# Patient Record
Sex: Male | Born: 1955 | Race: White | Hispanic: No | Marital: Married | Smoking: Current some day smoker
Health system: Southern US, Community
[De-identification: ages and names within clinical notes are randomized; demographics above are authoritative.]

## PROBLEM LIST (undated history)

## (undated) DIAGNOSIS — F1010 Alcohol abuse, uncomplicated: Secondary | ICD-10-CM

## (undated) DIAGNOSIS — F0390 Unspecified dementia without behavioral disturbance: Secondary | ICD-10-CM

## (undated) DIAGNOSIS — K7689 Other specified diseases of liver: Secondary | ICD-10-CM

## (undated) HISTORY — DX: Other specified diseases of liver: K76.89

## (undated) HISTORY — PX: APPENDECTOMY: SHX54

## (undated) HISTORY — PX: URETHRAL DILATION: SUR417

---

## 2006-04-15 ENCOUNTER — Ambulatory Visit (HOSPITAL_BASED_OUTPATIENT_CLINIC_OR_DEPARTMENT_OTHER): Admission: RE | Admit: 2006-04-15 | Discharge: 2006-04-15 | Payer: Self-pay | Admitting: Urology

## 2006-04-15 ENCOUNTER — Encounter (INDEPENDENT_AMBULATORY_CARE_PROVIDER_SITE_OTHER): Payer: Self-pay | Admitting: *Deleted

## 2006-09-28 ENCOUNTER — Ambulatory Visit: Payer: Self-pay | Admitting: Internal Medicine

## 2006-10-07 ENCOUNTER — Ambulatory Visit: Payer: Self-pay | Admitting: Internal Medicine

## 2006-10-08 ENCOUNTER — Encounter: Payer: Self-pay | Admitting: Internal Medicine

## 2010-09-25 NOTE — Op Note (Signed)
NAME:  Tanner Bond, Tanner Bond              ACCOUNT NO.:  [id number]   MEDICAL RECORD NO.:  [id number]          PATIENT TYPE:  AMB   LOCATION:  [facility]                         FACILITY:  [facility]   PHYSICIAN:  Sigmund I. Patsi Sears, M.D.DATE OF BIRTH:  1955-06-24   DATE OF PROCEDURE:  04/15/2006  DATE OF DISCHARGE:                               OPERATIVE REPORT   PREOPERATIVE DIAGNOSIS:  Left epididymal cysts, desires sterility.   POSTOPERATIVE DIAGNOSIS:  Left epididymal cysts, desires sterility.   PROCEDURE:  1. Excision of multiple left epididymal cysts.  2. Excision of left appendix testis.  3. Bilateral vasectomy.   SURGEON:  Dr. Patsi Sears   ASSISTANT:  Dr. Wilson Singer.   ANESTHESIA:  General.   SPECIMENS:  Left epididymal cyst wall, right vas, left vas.   ESTIMATED BLOOD LOSS:  Minimal.   COMPLICATIONS:  None.   DISPOSITION:  Stable to postanesthesia care unit.   INDICATIONS:  Tanner Bond is a 55 year old male who has had a  longstanding history of left-sided scrotal swelling.  He has had an  ultrasound performed which shows multiple left epididymal cysts which he  desires treatment for.  He also desires sterility.  He was counseled  regarding benefits and risks of scrotal exploration with excision of  these cysts and bilateral vasectomy.  Benefits and risks were explained,  and his consent was obtained.   DESCRIPTION OF PROCEDURE:  The patient was brought to the operating  room, properly identified.  A time-out was performed to confirm correct  patient, procedure, and side.  He was administered general anesthesia  and given preoperative antibiotics and then placed in the supine  position and prepped and draped in sterile fashion.   We made an incision along the left hemiscrotum.  Dissection was carried  down through the dartos layer to expose the cysts.  They were multiple  and appeared to be involving the epididymis level while 1 was directly  adherent to the left testicle.   The predominant cyst was dissected free  down to its stalk and then was clamped, divided, and this was ligated  with a Vicryl suture.  The remaining cyst was freed to the level of the  testicle.  This was then taken in a circumferential fashion around the  testicle.  The area of the communication was oversewn with a figure-of-  eight Vicryl suture to close the cyst opening.  We then excised the left  appendix testis.  All bleeding points were carefully coagulated.  Because we did not perform a full epididymectomy, decision was made to  perform a left vasectomy.  The vas was identified, doubly clamped,  divided.  Specimen was sent.  The vas was then ligated with a 2-0 Vicryl  suture.  The ends of the vas were coagulated.  A cord block was placed.  Because of the extensive dissection, decision was made to leave a  Penrose drain.  A separate stab incision was made through a dependent  portion of the scrotum, and a quarter-inch Penrose was placed.  The  testicle was then returned to the scrotum in its proper orientation.  The  wound was carefully and copiously irrigated.  The dartos layer was  closed in a running fashion with a 2-0 Vicryl suture.  The skin was  closed in a running fashion with a 3-0 Vicryl suture.   We then proceeded to perform right vasectomy.  The cord structures were  palpably identified, and the vas was brought to the level of the skin.  A 15 blade was used to make an incision over the skin down onto the vas.  This was sharply dissected free and clamped and then brought through the  incision.  The vas was carefully freed of all attachments.  It was then  doubly clipped, divided, and specimen was sent.  The ends were  coagulated.  The ends were also tied with Vicryl suture.  They were then  replaced into the scrotum.  Hemostasis was excellent.  The skin was then  closed in a figure-of-eight fashion with a Vicryl suture.  The patient  tolerated the procedure well, was  awakened from anesthesia, and  transported to the recovery room in stable condition.  There no  complications.  Please note that Dr. Patsi Sears was present and  participated in all aspects of this procedure.  He is Paramedic.     ______________________________  Tanner Purser, MD      Sigmund I. Patsi Sears, M.D.  Electronically Signed    JH/MEDQ  D:  04/15/2006  T:  04/15/2006  Job:  [id number]

## 2017-04-08 ENCOUNTER — Emergency Department (HOSPITAL_BASED_OUTPATIENT_CLINIC_OR_DEPARTMENT_OTHER)
Admission: EM | Admit: 2017-04-08 | Discharge: 2017-04-08 | Disposition: A | Discharge status: Home / Self Care | Payer: No Typology Code available for payment source | Attending: Emergency Medicine | Admitting: Emergency Medicine

## 2017-04-08 ENCOUNTER — Emergency Department (HOSPITAL_BASED_OUTPATIENT_CLINIC_OR_DEPARTMENT_OTHER): Payer: No Typology Code available for payment source

## 2017-04-08 ENCOUNTER — Encounter (HOSPITAL_BASED_OUTPATIENT_CLINIC_OR_DEPARTMENT_OTHER): Payer: Self-pay | Admitting: *Deleted

## 2017-04-08 ENCOUNTER — Other Ambulatory Visit: Payer: Self-pay

## 2017-04-08 DIAGNOSIS — R2243 Localized swelling, mass and lump, lower limb, bilateral: Secondary | ICD-10-CM | POA: Diagnosis present

## 2017-04-08 DIAGNOSIS — R609 Edema, unspecified: Secondary | ICD-10-CM

## 2017-04-08 DIAGNOSIS — R600 Localized edema: Secondary | ICD-10-CM | POA: Insufficient documentation

## 2017-04-08 HISTORY — DX: Alcohol abuse, uncomplicated: F10.10

## 2017-04-08 LAB — COMPREHENSIVE METABOLIC PANEL
ALBUMIN: 3.8 g/dL (ref 3.5–5.0)
ALK PHOS: 72 U/L (ref 38–126)
ALT: 17 U/L (ref 17–63)
AST: 45 U/L — AB (ref 15–41)
Anion gap: 6 (ref 5–15)
BILIRUBIN TOTAL: 2.3 mg/dL — AB (ref 0.3–1.2)
BUN: 8 mg/dL (ref 6–20)
CALCIUM: 8.7 mg/dL — AB (ref 8.9–10.3)
CO2: 26 mmol/L (ref 22–32)
Chloride: 103 mmol/L (ref 101–111)
Creatinine, Ser: 0.62 mg/dL (ref 0.61–1.24)
GFR calc Af Amer: >60 mL/min (ref >60)
GFR calc non Af Amer: >60 mL/min (ref >60)
GLUCOSE: 109 mg/dL — AB (ref 65–99)
Potassium: 3.3 mmol/L — ABNORMAL LOW (ref 3.5–5.1)
Sodium: 135 mmol/L (ref 135–145)
Total Protein: 7.3 g/dL (ref 6.5–8.1)

## 2017-04-08 LAB — CBC WITH DIFFERENTIAL/PLATELET
BASOS ABS: 0 10*3/uL (ref 0.0–0.1)
Basophils Relative: 1 %
Eosinophils Absolute: 0.3 10*3/uL (ref 0.0–0.7)
Eosinophils Relative: 4 %
HEMATOCRIT: 39.4 % (ref 39.0–52.0)
HEMOGLOBIN: 13.6 g/dL (ref 13.0–17.0)
Lymphocytes Relative: 27 %
Lymphs Abs: 1.7 10*3/uL (ref 0.7–4.0)
MCH: 35.5 pg — ABNORMAL HIGH (ref 26.0–34.0)
MCHC: 34.5 g/dL (ref 30.0–36.0)
MCV: 102.9 fL — ABNORMAL HIGH (ref 78.0–100.0)
Monocytes Absolute: 0.6 10*3/uL (ref 0.1–1.0)
Monocytes Relative: 10 %
NEUTROS ABS: 3.6 10*3/uL (ref 1.7–7.7)
NEUTROS PCT: 58 %
Platelets: 141 10*3/uL — ABNORMAL LOW (ref 150–400)
RBC: 3.83 MIL/uL — ABNORMAL LOW (ref 4.22–5.81)
RDW: 12.2 % (ref 11.5–15.5)
WBC: 6.2 10*3/uL (ref 4.0–10.5)

## 2017-04-08 NOTE — ED Provider Notes (Signed)
[facility] EMERGENCY DEPARTMENT Provider Note   CSN: [id number] Arrival date & time: 04/08/17  1425     History   Chief Complaint Chief Complaint  Patient presents with  . Leg Swelling    HPI Tanner Bond is a 61 y.o. male.  HPI   61 year old male who presents with bilateral lower extremity swelling. This has been ongoing for 2 months since he moved to [place] from the [place].  He denies associated chest pain, difficulty breathing, difficulty urinating, nausea or vomiting, diarrhea, melena or hematochezia.  No personal or family history of PE/DVT.  He does not endorse drinking alcohol daily, between 3-10 beers, and 2-3 shots of vodka daily.  Denies any other medical problems.  Has been trying to set up primary care doctor follow-up, and was able to get in to see Dr. Hyacinth Meeker today.  He was referred to the ED for ongoing testing.  Past Medical History:  Diagnosis Date  . Alcohol abuse     There are no active problems to display for this patient.   Past Surgical History:  Procedure Laterality Date  . APPENDECTOMY    . URETHRAL DILATION         Home Medications    Prior to Admission medications   Not on File    Family History No family history on file.  Social History Social History   Tobacco Use  . Smoking status: Never Smoker  . Smokeless tobacco: Never Used  Substance Use Topics  . Alcohol use: Yes  . Drug use: No     Allergies   Patient has no known allergies.   Review of Systems Review of Systems  Constitutional: Negative for fever.  Respiratory: Negative for cough and shortness of breath.   Cardiovascular: Positive for leg swelling. Negative for chest pain.  All other systems reviewed and are negative.    Physical Exam Updated Vital Signs BP 139/77 (BP Location: Right Arm)   Pulse 78   Temp 99.4 F (37.4 C) (Oral)   Resp 16   Ht 6\' 2"  (1.88 m)   Wt 75.7 kg (166 lb 14.2 oz)   SpO2 98%   BMI 21.43 kg/m    Physical Exam Physical Exam  Nursing note and vitals reviewed. Constitutional: non-toxic, and in no acute distress Head: Normocephalic and atraumatic.  Mouth/Throat: Oropharynx is clear and moist.  Neck: Normal range of motion. Neck supple.  Cardiovascular: Normal rate and regular rhythm.   Pulmonary/Chest: Effort normal and breath sounds normal.  Abdominal: Soft. There is no tenderness. There is no rebound and no guarding.  Musculoskeletal: Normal range of motion. Varicosities in bilateral lower extremities. +2 pedal edema bilaterally to the mid shin.  Neurological: Alert, no facial droop, fluent speech, moves all extremities symmetrically Skin: Skin is warm and dry.  Psychiatric: Cooperative   ED Treatments / Results  Labs (all labs ordered are listed, but only abnormal results are displayed) Labs Reviewed  CBC WITH DIFFERENTIAL/PLATELET - Abnormal; Notable for the following components:      Result Value   RBC 3.83 (*)    MCV 102.9 (*)    MCH 35.5 (*)    Platelets 141 (*)    All other components within normal limits  COMPREHENSIVE METABOLIC PANEL - Abnormal; Notable for the following components:   Potassium 3.3 (*)    Glucose, Bld 109 (*)    Calcium 8.7 (*)    AST 45 (*)    Total Bilirubin 2.3 (*)  All other components within normal limits    EKG  EKG Interpretation None       Radiology [place] Venous Img Lower Bilateral  Result Date: 04/08/2017 CLINICAL DATA:  The patient developed BILATERAL leg swelling after a long automobile trip. EXAM: BILATERAL LOWER EXTREMITY VENOUS DOPPLER ULTRASOUND TECHNIQUE: Gray-scale sonography with graded compression, as well as color Doppler and duplex ultrasound were performed to evaluate the lower extremity deep venous systems from the level of the common femoral vein and including the common femoral, femoral, profunda femoral, popliteal and calf veins including the posterior tibial, peroneal and gastrocnemius veins when visible. The  superficial great saphenous vein was also interrogated. Spectral Doppler was utilized to evaluate flow at rest and with distal augmentation maneuvers in the common femoral, femoral and popliteal veins. COMPARISON:  None. FINDINGS: RIGHT LOWER EXTREMITY Common Femoral Vein: No evidence of thrombus. Normal compressibility, respiratory phasicity and response to augmentation. Saphenofemoral Junction: No evidence of thrombus. Normal compressibility and flow on color Doppler imaging. Profunda Femoral Vein: No evidence of thrombus. Normal compressibility and flow on color Doppler imaging. Femoral Vein: No evidence of thrombus. Normal compressibility, respiratory phasicity and response to augmentation. Popliteal Vein: No evidence of thrombus. Normal compressibility, respiratory phasicity and response to augmentation. Calf Veins: Poor visualization, but no visible thrombus. Superficial Great Saphenous Vein: No evidence of thrombus. Normal compressibility. Venous Reflux:  None. Other Findings:  Calf edema. LEFT LOWER EXTREMITY Common Femoral Vein: No evidence of thrombus. Normal compressibility, respiratory phasicity and response to augmentation. Saphenofemoral Junction: No evidence of thrombus. Normal compressibility and flow on color Doppler imaging. Profunda Femoral Vein: No evidence of thrombus. Normal compressibility and flow on color Doppler imaging. Femoral Vein: No evidence of thrombus. Normal compressibility, respiratory phasicity and response to augmentation. Popliteal Vein: No evidence of thrombus. Normal compressibility, respiratory phasicity and response to augmentation. Calf Veins: No evidence of thrombus. Normal compressibility and flow on color Doppler imaging. Superficial Great Saphenous Vein: No evidence of thrombus. Normal compressibility. Venous Reflux:  None. Other Findings:  Calf edema. IMPRESSION: No evidence of deep venous thrombosis. BILATERAL RIGHT greater than LEFT calf edema. Electronically Signed    By: Elsie StainJohn T Curnes M.D.   On: 04/08/2017 19:28    Procedures Procedures (including critical care time)  Medications Ordered in ED Medications - No data to display   Initial Impression / Assessment and Plan / ED Course  I have reviewed the triage vital signs and the nursing notes.  Pertinent labs & imaging results that were available during my care of the patient were reviewed by me and considered in my medical decision making (see chart for details).     Presenting with bilateral lower extremity edema, right greater than left.  He is nontoxic in no acute distress and with normal vital signs.  No symptoms or concerns for heart failure, PE.  Lower extremity venous ultrasound does not show evidence of DVT.  He does show varicosities and some signs of venous insufficiency.  Discussed supportive care management including leg elevation, compression stockings.  Blood work does show liver dysfunction, related to likely his alcohol dependence history. May be role in peripheral edema. Normal renal function. Patient to be referred to PCP first. Counseled on decreasing alcohol usage and eventually quitting. Strict return and follow-up instructions reviewed. He expressed understanding of all discharge instructions and felt comfortable with the plan of care.   Final Clinical Impressions(s) / ED Diagnoses   Final diagnoses:  Peripheral edema    ED Discharge Orders  None       Lavera GuiseLiu, Ireanna Finlayson Duo, MD 04/08/17 [phone]

## 2017-04-08 NOTE — Discharge Instructions (Signed)
Please keep legs elevated on 2-3 pillows at rest. Wear compression stockings during the day. YOu may need to follow-up with vascular specialist for ongoing management if symptoms bothersome to you.  Please also arrange follow-up with primary care doctor. You do show signs of liver dysfunction, likely related to your alcohol use. You may also need to follow-up with Gi doctor. In the interim, please consider decreasing your alcohol use significantly.   Return for worsening symptoms, including fever, confusion, difficulty breathing, or any other symptoms concerning ot you.

## 2017-04-08 NOTE — ED Triage Notes (Signed)
Both legs are swelling x 2 months after driving [place]. Denies SOB. He was seen by his MD today and further evaluation for alcohol abuse being the cause vs DVT.

## 2017-04-12 ENCOUNTER — Encounter: Payer: Self-pay | Admitting: Family Medicine

## 2017-04-12 ENCOUNTER — Ambulatory Visit (INDEPENDENT_AMBULATORY_CARE_PROVIDER_SITE_OTHER): Payer: No Typology Code available for payment source | Admitting: Family Medicine

## 2017-04-12 VITALS — BP 132/82 | HR 86 | Temp 98.4°F | Ht 72.0 in | Wt 169.8 lb

## 2017-04-12 DIAGNOSIS — Z23 Encounter for immunization: Secondary | ICD-10-CM | POA: Diagnosis not present

## 2017-04-12 DIAGNOSIS — Z86010 Personal history of colonic polyps: Secondary | ICD-10-CM | POA: Insufficient documentation

## 2017-04-12 DIAGNOSIS — I10 Essential (primary) hypertension: Secondary | ICD-10-CM | POA: Diagnosis not present

## 2017-04-12 DIAGNOSIS — Z860101 Personal history of adenomatous and serrated colon polyps: Secondary | ICD-10-CM | POA: Insufficient documentation

## 2017-04-12 DIAGNOSIS — F1020 Alcohol dependence, uncomplicated: Secondary | ICD-10-CM | POA: Diagnosis not present

## 2017-04-12 DIAGNOSIS — F1021 Alcohol dependence, in remission: Secondary | ICD-10-CM | POA: Insufficient documentation

## 2017-04-12 DIAGNOSIS — R160 Hepatomegaly, not elsewhere classified: Secondary | ICD-10-CM

## 2017-04-12 MED ORDER — TRIAMCINOLONE ACETONIDE 0.1 % EX CREA: 1.0000 "application " | TOPICAL_CREAM | Freq: Two times a day (BID) | CUTANEOUS | 1 refills | Status: DC

## 2017-04-12 NOTE — Progress Notes (Signed)
Phone: [phone]  Subjective:  Patient presents today to establish care.  Prior patient in [place] but over 10 years ago- has been living in [place]. Chief complaint-noted.   See problem oriented charting  The following were reviewed and entered/updated in epic: Past Medical History:  Diagnosis Date  . Alcohol abuse   . Liver dysfunction    Patient Active Problem List   Diagnosis Date Noted  . Hypertension 04/13/2017  . Alcoholism (HCC) 04/12/2017  . History of adenomatous polyp of colon 04/12/2017   Past Surgical History:  Procedure Laterality Date  . APPENDECTOMY     as child  . URETHRAL DILATION     as child    Family History  Problem Relation Age of Onset  . Dementia Mother   . Prostate cancer Father     Medications- reviewed and updated Current Outpatient Medications  Medication Sig Dispense Refill  . triamcinolone cream (KENALOG) 0.1 % Apply 1 application topically 2 (two) times daily. Take 1 week break after 1 week of use 80 g 1   No current facility-administered medications for this visit.     Allergies-reviewed and updated No Known Allergies  Social History   Socioeconomic History  . Marital status: Married    Spouse name: None  . Number of children: None  . Years of education: None  . Highest education level: None  Social Needs  . Financial resource strain: None  . Food insecurity - worry: None  . Food insecurity - inability: None  . Transportation needs - medical: None  . Transportation needs - non-medical: None  Occupational History  . None  Tobacco Use  . Smoking status: Never Smoker  . Smokeless tobacco: Never Used  Substance and Sexual Activity  . Alcohol use: Yes    Comment: at least 3-4 a day up to 10  . Drug use: No  . Sexual activity: Yes  Other Topics Concern  . None  Social History Narrative   Married- lives with wife.       Lived from around 2009 to 2018 in [place]. Want to be back for more family  time and see some snow   4 children (3 daughters, 1 son all grown). 7 grandkids. 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  HENT: Negative for ear discharge and ear pain.   Eyes: Negative for blurred vision and double vision.  Respiratory: Negative for cough and shortness of breath.   Cardiovascular: Negative for chest pain and palpitations.  Gastrointestinal: Negative for abdominal pain and vomiting.  Genitourinary: Negative for dysuria and urgency.  Musculoskeletal: Negative for falls and neck pain.  Skin: Positive for rash (spider veins on abdomen). Negative for itching.  Neurological: Negative for dizziness and headaches.  Endo/Heme/Allergies: Negative for polydipsia. Does not bruise/bleed easily.  Psychiatric/Behavioral: Positive for substance abuse (alcohol abuse). Negative for suicidal ideas.   Objective: BP 132/82 (BP Location: Left Arm, Patient Position: Sitting, Cuff Size: Large)   Pulse 86   Temp 98.4 F (36.9 C) (Oral)   Ht 6' (1.829 m)   Wt 169 lb 12.8 oz (77 kg)   SpO2 96%   BMI 23.03 kg/m  Gen: NAD, resting comfortably HEENT: Mucous membranes are moist. Oropharynx normal. TM normal. Eyes: sclera and lids normal, PERRLA Neck:  no thyromegaly, no cervical lymphadenopathy CV: RRR no murmurs rubs or gallops Lungs: CTAB no crackles, wheeze, rhonchi Abdomen: soft/nontender/nondistended/normal bowel sounds. No rebound or guarding.  Hepatomegaly noted Ext: 1+ edema under compression stockings Skin: warm, dry, spider angiomas noted on chest Neuro: 5/5 strength in upper and lower extremities, normal gait, normal reflexes, appears to be mildly slightly shaky  Assessment/plan  Rash S: has noted papules on his face since leaving [place]- not particularly itchy. Also pruritic  rash on legs- told eczema in past - hydrocortisone 2.5% he could get from the [place] helped ROS-not ill appearing, no fever/chills. No new medications. Not immunocompromised. No mucus membrane involvement.  A/P: rash on legs could be related to pruritis if cirrhoitc- will trial triamcinolone with follow up after ultrasound.   Facial rash could be rosacea- consider treatment at follow up  Alcoholism [facility](HCC) S: Patient at least for last 10 years has been drinking excess alcohol. Use to use liquor now feels he is much improved as only drinking alcohol 3-10 x a day and often on upper end. Last 2 years have been particularly bad. He has been told by a family friend that he needs to go to AA  We reviewed fact he is already having some changes on LFTs as noted during hospitalization. Edema strong potential to be related to cirrhosis.  Lab Results  Component Value Date   ALT 17 04/08/2017   AST 45 (H) 04/08/2017   ALKPHOS 72 04/08/2017   BILITOT 2.3 (H) 04/08/2017    A/P: Extended counseling on risks of alcohol, potential for cirrhosis given physical exam findings. Will get ultrasound of abdomen. After that will have patient back-   We discussed using something like a librium taper but he is not ready to completely quit- instead wants to work on slowly cutting down but informed him right solution for him is to quit.   Consider GGT, hepatitis panel, repeat LFTs at follow up  Hypertension S: controlled today on no medication- has been on medicine in the past.  BP Readings from Last 3 Encounters:  04/12/17 132/82  04/08/17 (!) 146/82  A/P: We discussed blood pressure goal of <140/90. Continue without meds- will trend.    History of adenomatous polyp of colon History of adenomatous polyp- past due for colonoscopy- will refer back  Patient Instructions  Tdap and flu today  Likely shingrix will be hard to find- hopefully we can get this next year  We will call you within a week or two about  your referral for ultrasound liver. If you do not hear within 3 weeks, give us a call.  I want to see you back a week or two after ultrasound- will likely need to get more extensive bloodwork at that time  Please strongly consider AA- I am worried we are dealing with cirrhosis already which can lead to early death  We will call you within a week or two about your referral for colonoscopy. If you do not hear within 3 weeks, give us a call.    Sent cream for legs  1 week follow up after ultrasound  Orders Placed This Encounter  Procedures  . US Abdomen Limited RUQ    Standing Status:   Future    Standing Expiration Date:   06/13/2018    Order Specific Question:   Reason for Exam (SYMPTOM  OR DIAGNOSIS REQUIRED)    Answer:   alcoholic, hepatomegaly, spider angiomas- suspect cirrhosis    Order Specific Question:  Preferred imaging location?    Answer:   [facility]   . Tdap vaccine greater than or equal to 7yo IM  . Flu Vaccine QUAD 36+ mos IM  . Ambulatory referral to Gastroenterology    Referral Priority:   Routine    Referral Type:   Consultation    Referral Reason:   Specialty Services Required    Number of Visits Requested:   1    Meds ordered this encounter  Medications  . triamcinolone cream (KENALOG) 0.1 %    Sig: Apply 1 application topically 2 (two) times daily. Take 1 week break after 1 week of use    Dispense:  80 g    Refill:  1   The duration of face-to-face time during this visit was greater than 30 minutes. Greater than 50% of this time was spent in counseling, explanation of diagnosis, planning of further management, and/or coordination of care including potential for cirrhosis, risks of alcohol use, options to help with sobriety.   Return precautions advised.  Tana ConchStephen Daziya Redmond, MD

## 2017-04-12 NOTE — Patient Instructions (Addendum)
Tdap and flu today  Likely shingrix will be hard to find- hopefully we can get this next year  We will call you within a week or two about your referral for ultrasound liver. If you do not hear within 3 weeks, give us a call.  I want to see you back a week or two after ultrasound- will likely need to get more extensive bloodwork at that time  Please strongly consider AA- I am worried we are dealing with cirrhosis already which can lead to early death  We will call you within a week or two about your referral for colonoscopy. If you do not hear within 3 weeks, give us a call.    Sent cream for legs

## 2017-04-13 DIAGNOSIS — I10 Essential (primary) hypertension: Secondary | ICD-10-CM | POA: Insufficient documentation

## 2017-04-13 NOTE — Assessment & Plan Note (Signed)
S: controlled today on no medication- has been on medicine in the past.  BP Readings from Last 3 Encounters:  04/12/17 132/82  04/08/17 (!) 146/82  A/P: We discussed blood pressure goal of <140/90. Continue without meds- will trend.

## 2017-04-13 NOTE — Assessment & Plan Note (Addendum)
S: Patient at least for last 10 years has been drinking excess alcohol. Use to use liquor now feels he is much improved as only drinking alcohol 3-10 x a day and often on upper end. Last 2 years have been particularly bad. He has been told by a family friend that he needs to go to AA  We reviewed fact he is already having some changes on LFTs as noted during hospitalization. Edema strong potential to be related to cirrhosis.  Lab Results  Component Value Date   ALT 17 04/08/2017   AST 45 (H) 04/08/2017   ALKPHOS 72 04/08/2017   BILITOT 2.3 (H) 04/08/2017    A/P: Extended counseling on risks of alcohol, potential for cirrhosis given physical exam findings. Will get ultrasound of abdomen. After that will have patient back-   We discussed using something like a librium taper but he is not ready to completely quit- instead wants to work on slowly cutting down but informed him right solution for him is to quit.   Consider GGT, hepatitis panel, repeat LFTs at follow up

## 2017-04-13 NOTE — Assessment & Plan Note (Signed)
History of adenomatous polyp- past due for colonoscopy- will refer back

## 2017-04-14 ENCOUNTER — Encounter: Payer: Self-pay | Admitting: Family Medicine

## 2017-05-05 ENCOUNTER — Encounter: Payer: Self-pay | Admitting: Family Medicine

## 2017-05-09 ENCOUNTER — Telehealth: Payer: Self-pay

## 2017-05-09 NOTE — Telephone Encounter (Signed)
Called patient and left a voicemail message asking him to return my call or call   [facility] [address] [place], [place]  [id number] Phone:  [phone]   Fax:  [phone]  To schedule his appointment

## 2017-06-24 ENCOUNTER — Encounter: Payer: Self-pay | Admitting: Family Medicine

## 2017-06-27 ENCOUNTER — Encounter: Payer: Self-pay | Admitting: Family Medicine

## 2017-06-28 MED ORDER — TRIAMCINOLONE ACETONIDE 0.1 % EX CREA: 1.0000 "application " | TOPICAL_CREAM | Freq: Two times a day (BID) | CUTANEOUS | 2 refills | Status: DC

## 2017-06-28 NOTE — Telephone Encounter (Signed)
Called pt and asked him what I can help him with? Pt said he was calling to discuss his bill from the ED and that he is wanting hold off on any other testing until his bill is straightened out, he has contacted Billing. Asked pt if he read message from 2/15? Pt said yes. Told him at physical prostate will be checked then. Pt verbalized understanding and said it shows he needs Hep C test. Told pt when you have your physical we can check it then with your labs. Pt verbalized understanding. Pt said he is schedule for U/S tomorrow wants to know if it will check his liver? Told pt yes it will look at your liver. Gave pt instructions for U/S and where to go tomorrow. Pt verbalized understanding. Also gave pt number for [facility] and told him to call and schedule at his convince. Pt verbalized understanding.

## 2017-06-28 NOTE — Telephone Encounter (Signed)
Copied from CRM [id number]. Topic: Quick Communication - Office Called Patient >> Jun 24, 2017  3:16 PM Dierdre Searlesoleman, Brandy S, [place] wrote:   Reason for CRM: Patient scheduled for 06/29/17 at 11:30 to [facility], [facility], radiology dept. NPO after midnight. He can call [phone] to r/s if needed. OK to advise pt.  >> Jun 28, 2017  9:55 AM Eston Mouldavis, Cheri B wrote: PT is asking for a call back from the office.  He states he can not afford to [facility] due to recent moving expenses.

## 2017-06-29 ENCOUNTER — Ambulatory Visit (HOSPITAL_COMMUNITY)
Admission: RE | Admit: 2017-06-29 | Discharge: 2017-06-29 | Disposition: A | Discharge status: Home / Self Care | Payer: No Typology Code available for payment source | Source: Ambulatory Visit | Attending: Family Medicine | Admitting: Family Medicine

## 2017-06-29 DIAGNOSIS — K824 Cholesterolosis of gallbladder: Secondary | ICD-10-CM | POA: Insufficient documentation

## 2017-06-29 DIAGNOSIS — R932 Abnormal findings on diagnostic imaging of liver and biliary tract: Secondary | ICD-10-CM | POA: Insufficient documentation

## 2017-06-29 DIAGNOSIS — R160 Hepatomegaly, not elsewhere classified: Secondary | ICD-10-CM | POA: Diagnosis not present

## 2017-06-29 DIAGNOSIS — F1020 Alcohol dependence, uncomplicated: Secondary | ICD-10-CM

## 2017-06-29 NOTE — Telephone Encounter (Signed)
Dr. Durene CalHunter, I spoke to pt yesterday see message. Pt had U/S done today report is in chart.

## 2017-07-05 ENCOUNTER — Encounter: Payer: Self-pay | Admitting: Internal Medicine

## 2017-07-05 ENCOUNTER — Ambulatory Visit (INDEPENDENT_AMBULATORY_CARE_PROVIDER_SITE_OTHER): Payer: No Typology Code available for payment source | Admitting: Family Medicine

## 2017-07-05 ENCOUNTER — Encounter: Payer: Self-pay | Admitting: Family Medicine

## 2017-07-05 VITALS — BP 124/78 | HR 73 | Temp 98.3°F | Ht 72.0 in | Wt 170.8 lb

## 2017-07-05 DIAGNOSIS — K709 Alcoholic liver disease, unspecified: Secondary | ICD-10-CM

## 2017-07-05 DIAGNOSIS — R358 Other polyuria: Secondary | ICD-10-CM

## 2017-07-05 DIAGNOSIS — N401 Enlarged prostate with lower urinary tract symptoms: Secondary | ICD-10-CM | POA: Diagnosis not present

## 2017-07-05 DIAGNOSIS — G2581 Restless legs syndrome: Secondary | ICD-10-CM

## 2017-07-05 DIAGNOSIS — I10 Essential (primary) hypertension: Secondary | ICD-10-CM | POA: Diagnosis not present

## 2017-07-05 DIAGNOSIS — G4700 Insomnia, unspecified: Secondary | ICD-10-CM | POA: Diagnosis not present

## 2017-07-05 DIAGNOSIS — R3589 Other polyuria: Secondary | ICD-10-CM

## 2017-07-05 DIAGNOSIS — R351 Nocturia: Secondary | ICD-10-CM

## 2017-07-05 DIAGNOSIS — Z125 Encounter for screening for malignant neoplasm of prostate: Secondary | ICD-10-CM | POA: Diagnosis not present

## 2017-07-05 DIAGNOSIS — Z1211 Encounter for screening for malignant neoplasm of colon: Secondary | ICD-10-CM

## 2017-07-05 LAB — POC URINALSYSI DIPSTICK (AUTOMATED)
Bilirubin, UA: NEGATIVE
Blood, UA: NEGATIVE
Glucose, UA: NEGATIVE
KETONES UA: NEGATIVE
LEUKOCYTES UA: NEGATIVE
Nitrite, UA: NEGATIVE
PH UA: 6 (ref 5.0–8.0)
PROTEIN UA: NEGATIVE
SPEC GRAV UA: 1.015 (ref 1.010–1.025)
Urobilinogen, UA: 0.2 E.U./dL

## 2017-07-05 LAB — PSA: PSA: 0.39 ng/mL (ref 0.10–4.00)

## 2017-07-05 LAB — COMPREHENSIVE METABOLIC PANEL
ALT: 14 U/L (ref 0–53)
AST: 24 U/L (ref 0–37)
Albumin: 4 g/dL (ref 3.5–5.2)
Alkaline Phosphatase: 73 U/L (ref 39–117)
BUN: 11 mg/dL (ref 6–23)
CALCIUM: 9.6 mg/dL (ref 8.4–10.5)
CHLORIDE: 101 meq/L (ref 96–112)
CO2: 28 meq/L (ref 19–32)
CREATININE: 0.68 mg/dL (ref 0.40–1.50)
GFR: 125.05 mL/min (ref >60.00)
GLUCOSE: 95 mg/dL (ref 70–99)
Potassium: 3.9 mEq/L (ref 3.5–5.1)
Sodium: 137 mEq/L (ref 135–145)
Total Bilirubin: 2.2 mg/dL — ABNORMAL HIGH (ref 0.2–1.2)
Total Protein: 6.9 g/dL (ref 6.0–8.3)

## 2017-07-05 LAB — GAMMA GT: GGT: 41 U/L (ref 7–51)

## 2017-07-05 MED ORDER — TRAZODONE HCL 50 MG PO TABS: 25.0000 mg | ORAL_TABLET | Freq: Every evening | ORAL | 3 refills | Status: DC | PRN

## 2017-07-05 NOTE — Progress Notes (Signed)
Subjective:  Tanner Bond is a 62 y.o. year old very pleasant male patient who presents for/with See problem oriented charting ROS- no fever or chills.  No ruq pain. No obvious withdrawal symptoms. No chest pain or shortness of breath.   Past Medical History-  Patient Active Problem List   Diagnosis Date Noted  . Hypertension 04/13/2017    Priority: Medium  . BPH associated with nocturia 07/06/2017  . Insomnia 07/06/2017  . Alcoholic liver disease (HCC) 07/05/2017  . Alcoholism (HCC) 04/12/2017  . History of adenomatous polyp of colon 04/12/2017    Medications- reviewed and updated Current Outpatient Medications  Medication Sig Dispense Refill  . triamcinolone cream (KENALOG) 0.1 % Apply 1 application topically 2 (two) times daily. Take 1 week break after 1 week of use 80 g 2   No current facility-administered medications for this visit.     Objective: BP 124/78 (BP Location: Left Arm, Patient Position: Sitting, Cuff Size: Large)   Pulse 73   Temp 98.3 F (36.8 C) (Oral)   Ht 6' (1.829 m)   Wt 170 lb 12.8 oz (77.5 kg)   SpO2 97%   BMI 23.16 kg/m  Gen: NAD, resting comfortably CV: RRR no murmurs rubs or gallops Lungs: CTAB no crackles, wheeze, rhonchi Abdomen: soft/nontender/nondistended/normal bowel sounds. Slight hepatomegaly Ext: no edema Skin: warm, dry Rectal: normal tone, diffusely enlarged prostate, no masses or tenderness   Assessment/Plan:   Alcoholic liver disease (HCC) S: luckily no obvious cirrhosis on ultrasound. Does have some fatty changes likely associated with alcoholic liver disease A/P: Biggest thing is to abstain from alcohol- he declines AA again. He has cut down from 3-10 alcoholic beverages a day to 2 glasses of wine every other day but had odouls nonalcoholic 5-10 a day. Adivsed water (will consider bottled but doesn't like his well water), he doesn't like sodas- he essentially makes excuses and is not ready to fully quit.   we will evaluate  for hepatitis and immunize as appropriate. GGT not elevated today Lab Results  Component Value Date   ALT 14 07/05/2017   AST 24 07/05/2017   ALKPHOS 73 07/05/2017   BILITOT 2.2 (H) 07/05/2017  LFTs improved- still slightly high bilirubin.   BPH associated with nocturia Polyuria/nocturia S: frequent urination in the day. Worse at night- up to 5x. No recent pSA Lab Results  Component Value Date   PSA 0.39 07/05/2017  A/P: some BPH on exam- likely cause. PSA not particularly high- doubt prostate cancer but will trend yearly. Could consider flomax in the future. UA reassuring- doubt UTI   Hypertension S: controlled on no rx BP Readings from Last 3 Encounters:  07/05/17 124/78  04/12/17 132/82  04/08/17 (!) 146/82  A/P: blood pressure goal of <140/90. Seems cutting down on alcohol intake is helping   Insomnia S: Patient states melatonin did not help. He has no issues with prolonged erections. Legs twitch at bedtime but not primary thing that keeps him up- wife notes it even if hes asleep A/P: we discussed trial of trazodone (warned of potential prolonged erections). Could consider restless legs treatment if trazodone not helpful. Would avoid benzos with his alchol history    Future Appointments  Date Time Provider Department Center  09/13/2017 10:00 AM [facility]  09/27/2017 10:30 AM Iva BoopGessner, Carl E, MD [facility]   Lab/Order associations: Alcoholic liver disease (HCC) - Plan: Gamma GT, Comprehensive metabolic panel, Hepatitis C antibody, Hepatitis B surface antigen, Hepatitis B  surface antibody, Hepatitis B core antibody, total, Hepatitis A antibody, total, Hepatitis A antibody, IgM  Polyuria - Plan: POCT Urinalysis Dipstick (Automated), POCT Urinalysis Dipstick (Automated)  Screening PSA (prostate specific antigen) - Plan: PSA  Restless legs - Plan: Iron, TIBC and Ferritin Panel  Screen for colon cancer - Plan: Ambulatory referral to  Gastroenterology  Meds ordered this encounter  Medications  . traZODone (DESYREL) 50 MG tablet    Sig: Take 0.5-1 tablets (25-50 mg total) by mouth at bedtime as needed for sleep.    Dispense:  30 tablet    Refill:  3   Return precautions advised.  Tana Conch, MD

## 2017-07-05 NOTE — Patient Instructions (Addendum)
Please stop by lab before you go- blood and urine  Trial trazodone for sleep. If you have an erection over 4 hours- seek care and stop taking this medicine  Rectal exam

## 2017-07-06 DIAGNOSIS — R351 Nocturia: Secondary | ICD-10-CM

## 2017-07-06 DIAGNOSIS — N401 Enlarged prostate with lower urinary tract symptoms: Secondary | ICD-10-CM | POA: Insufficient documentation

## 2017-07-06 DIAGNOSIS — G4700 Insomnia, unspecified: Secondary | ICD-10-CM | POA: Insufficient documentation

## 2017-07-06 LAB — IRON,TIBC AND FERRITIN PANEL
%SAT: 77 % — AB (ref 15–60)
Ferritin: 127 ng/mL (ref 20–380)
Iron: 271 ug/dL — ABNORMAL HIGH (ref 50–180)
TIBC: 352 mcg/dL (calc) (ref 250–425)

## 2017-07-06 LAB — HEPATITIS A ANTIBODY, TOTAL: Hepatitis A AB,Total: NONREACTIVE

## 2017-07-06 LAB — HEPATITIS B SURFACE ANTIGEN: HEP B S AG: NONREACTIVE

## 2017-07-06 LAB — HEPATITIS C ANTIBODY
Hepatitis C Ab: NONREACTIVE
SIGNAL TO CUT-OFF: 0.01 (ref <1.00)

## 2017-07-06 LAB — HEPATITIS B CORE ANTIBODY, TOTAL: Hep B Core Total Ab: NONREACTIVE

## 2017-07-06 LAB — HEPATITIS B SURFACE ANTIBODY,QUALITATIVE: Hep B S Ab: NONREACTIVE

## 2017-07-06 LAB — HEPATITIS A ANTIBODY, IGM: Hep A IgM: NONREACTIVE

## 2017-07-06 NOTE — Assessment & Plan Note (Signed)
S: controlled on no rx BP Readings from Last 3 Encounters:  07/05/17 124/78  04/12/17 132/82  04/08/17 (!) 146/82  A/P: blood pressure goal of <140/90. Seems cutting down on alcohol intake is helping

## 2017-07-06 NOTE — Assessment & Plan Note (Signed)
S: Patient states melatonin did not help. He has no issues with prolonged erections. Legs twitch at bedtime but not primary thing that keeps him up- wife notes it even if hes asleep A/P: we discussed trial of trazodone (warned of potential prolonged erections). Could consider restless legs treatment if trazodone not helpful. Would avoid benzos with his alchol history

## 2017-07-06 NOTE — Assessment & Plan Note (Signed)
S: luckily no obvious cirrhosis on ultrasound. Does have some fatty changes likely associated with alcoholic liver disease A/P: Biggest thing is to abstain from alcohol- he declines AA again. He has cut down from 3-10 alcoholic beverages a day to 2 glasses of wine every other day but had odouls nonalcoholic 5-10 a day. Adivsed water (will consider bottled but doesn't like his well water), he doesn't like sodas- he essentially makes excuses and is not ready to fully quit.   we will evaluate for hepatitis and immunize as appropriate. GGT not elevated today Lab Results  Component Value Date   ALT 14 07/05/2017   AST 24 07/05/2017   ALKPHOS 73 07/05/2017   BILITOT 2.2 (H) 07/05/2017  LFTs improved- still slightly high bilirubin.

## 2017-07-06 NOTE — Assessment & Plan Note (Signed)
Polyuria/nocturia S: frequent urination in the day. Worse at night- up to 5x. No recent pSA Lab Results  Component Value Date   PSA 0.39 07/05/2017  A/P: some BPH on exam- likely cause. PSA not particularly high- doubt prostate cancer but will trend yearly. Could consider flomax in the future. UA reassuring- doubt UTI

## 2017-09-08 ENCOUNTER — Telehealth: Payer: Self-pay | Admitting: *Deleted

## 2017-09-08 NOTE — Telephone Encounter (Signed)
Copied from CRM [id number]. Topic: General - Other >> Sep 08, 2017 12:28 PM [name], [name] wrote: Pt called in requesting a call back from Dr. Durene Cal. Pt stated he would like to discuss a few issues with Dr Durene Cal. Pt stated he would like to discuss issues he is having with his complexion and pimples. He stated he also needs to discuss the hospital visit that he is now being billed for. Pt did not want an appt. He only wanted Dr. Durene Cal to return his call. Cb# [phone]

## 2017-09-08 NOTE — Telephone Encounter (Signed)
Office visit needed

## 2017-09-09 ENCOUNTER — Encounter: Payer: Self-pay | Admitting: Family Medicine

## 2017-09-09 NOTE — Telephone Encounter (Signed)
Called patient to schedule an appointment but patient did not answer. I left a voicemail for patient to call our office back.

## 2017-09-09 NOTE — Telephone Encounter (Signed)
Noted  

## 2017-09-12 NOTE — Telephone Encounter (Signed)
Looks like pt has appointment scheduled for 09/26/17.

## 2017-09-26 ENCOUNTER — Ambulatory Visit (INDEPENDENT_AMBULATORY_CARE_PROVIDER_SITE_OTHER): Payer: No Typology Code available for payment source | Admitting: Family Medicine

## 2017-09-26 ENCOUNTER — Encounter: Payer: Self-pay | Admitting: Family Medicine

## 2017-09-26 VITALS — BP 124/84 | HR 68 | Temp 98.5°F | Ht 72.0 in | Wt 166.4 lb

## 2017-09-26 DIAGNOSIS — L700 Acne vulgaris: Secondary | ICD-10-CM

## 2017-09-26 DIAGNOSIS — F1021 Alcohol dependence, in remission: Secondary | ICD-10-CM | POA: Diagnosis not present

## 2017-09-26 DIAGNOSIS — K709 Alcoholic liver disease, unspecified: Secondary | ICD-10-CM | POA: Diagnosis not present

## 2017-09-26 DIAGNOSIS — L709 Acne, unspecified: Secondary | ICD-10-CM | POA: Insufficient documentation

## 2017-09-26 MED ORDER — DOXYCYCLINE HYCLATE 100 MG PO TABS: 100.0000 mg | ORAL_TABLET | Freq: Two times a day (BID) | ORAL | 0 refills | Status: DC

## 2017-09-26 MED ORDER — CLINDAMYCIN PHOS-BENZOYL PEROX 1-5 % EX GEL: Freq: Two times a day (BID) | CUTANEOUS | 5 refills | Status: DC

## 2017-09-26 NOTE — Progress Notes (Signed)
Subjective:  Tanner Bond is a 62 y.o. year old very pleasant male patient who presents for/with See problem oriented charting ROS- denies increased edema. Still some issues with sleep. Reports red spots on face without worsening erythema.    Past Medical History-  Patient Active Problem List   Diagnosis Date Noted  . Alcoholic liver disease (HCC) 07/05/2017    Priority: High  . Alcoholism in remission (HCC) 04/12/2017    Priority: High  . Acne 09/26/2017    Priority: Medium  . Hypertension 04/13/2017    Priority: Medium  . BPH associated with nocturia 07/06/2017  . Insomnia 07/06/2017  . History of adenomatous polyp of colon 04/12/2017    Medications- reviewed and updated Current Outpatient Medications  Medication Sig Dispense Refill  . clindamycin-benzoyl peroxide (BENZACLIN) gel Apply topically 2 (two) times daily. Use white towels- can bleach towels/clothes 50 g 5  . doxycycline (VIBRA-TABS) 100 MG tablet Take 1 tablet (100 mg total) by mouth 2 (two) times daily. 20 tablet 0  . traZODone (DESYREL) 50 MG tablet Take 0.5-1 tablets (25-50 mg total) by mouth at bedtime as needed for sleep. 30 tablet 3  . triamcinolone cream (KENALOG) 0.1 % Apply 1 application topically 2 (two) times daily. Take 1 week break after 1 week of use 80 g 2   No current facility-administered medications for this visit.     Objective: BP 124/84 (BP Location: Left Arm, Patient Position: Sitting, Cuff Size: Normal)   Pulse 68   Temp 98.5 F (36.9 C) (Oral)   Ht 6' (1.829 m)   Wt 166 lb 6.4 oz (75.5 kg)   SpO2 97%   BMI 22.57 kg/m  Gen: NAD, resting comfortably CV: regular rate and rhythm Lungs: nonlabored Abdomen: soft/nontender/nondistended Skin: warm, dry, multiple papules and pustules on cheeks  Assessment/Plan:  Other notes: 1.wants to hold off on colonoscopy- $1700 for ED visit for edema.  2. Sleeping better but still some leg movement- sleeps better on couch because doesn't distrub  his wife  Acne S:  history of acne. Doxycycline was helpful in the past. Has used topicals OTC in the past.   Years and years of issues.  Comedones and pustules on face on cheeks, chin, forehead A/P: We will treat this like acne.  I told patient I am suspicious this could be rosacea though has not been diagnosed previously.   "Use benzaclin (topical ) twice daily at least for next month- can use for longer if needed- actually some people use long term  Doxycycline to try to calm things down over next 10 days (oral)  This could also be rosacea- we could trial metronidazole gel in the future instead of above if not making improvement. Send me a Clinical cytogeneticist message or call if no progress in a month or two"  Alcoholism in remission [facility]) Patient has remained alcohol free.  He continues to drink O'Doul's nonalcoholic beverages.  Alcoholic liver disease (HCC) Patient is abstaining from alcohol and using O'Doul's instead. No cirrhosis on ultrasound but does have fatty liver as per prior notes. Bilirubin elevated but other labs have normalized off alcohl.   After visit-remembered we do need to get him set him up with twinrix still.  Immunization History  Administered Date(s) Administered  . Influenza,inj,Quad PF,6+ Mos 04/12/2017  . Tdap 04/12/2017     Meds ordered this encounter  Medications  . clindamycin-benzoyl peroxide (BENZACLIN) gel    Sig: Apply topically 2 (two) times daily. Use white towels- can bleach towels/clothes  Dispense:  50 g    Refill:  5  . doxycycline (VIBRA-TABS) 100 MG tablet    Sig: Take 1 tablet (100 mg total) by mouth 2 (two) times daily.    Dispense:  20 tablet    Refill:  0   Return precautions advised.  Tana Conch, MD

## 2017-09-26 NOTE — Assessment & Plan Note (Signed)
Patient has remained alcohol free.  He continues to drink O'Doul's nonalcoholic beverages.

## 2017-09-26 NOTE — Assessment & Plan Note (Signed)
S:  history of acne. Doxycycline was helpful in the past. Has used topicals OTC in the past.   Years and years of issues.  Comedones and pustules on face on cheeks, chin, forehead A/P: We will treat this like acne.  I told patient I am suspicious this could be rosacea though has not been diagnosed previously.   "Use benzaclin (topical ) twice daily at least for next month- can use for longer if needed- actually some people use long term  Doxycycline to try to calm things down over next 10 days (oral)  This could also be rosacea- we could trial metronidazole gel in the future instead of above if not making improvement. Send me a Clinical cytogeneticist message or call if no progress in a month or two"

## 2017-09-26 NOTE — Assessment & Plan Note (Signed)
Patient is abstaining from alcohol and using O'Doul's instead. No cirrhosis on ultrasound but does have fatty liver as per prior notes. Bilirubin elevated but other labs have normalized off alcohl.   After visit-remembered we do need to get him set him up with twinrix still.  Immunization History  Administered Date(s) Administered  . Influenza,inj,Quad PF,6+ Mos 04/12/2017  . Tdap 04/12/2017

## 2017-09-26 NOTE — Patient Instructions (Addendum)
Health Maintenance Due  Topic Date Due  . COLONOSCOPY - Patient will reschedule after he takes care of some personal/financial things. 10/06/2016   Use benzaclin (topical ) twice daily at least for next month- can use for longer if needed- actually some people use long term  Doxycycline to try to calm things down over next 10 days (oral)  This could also be rosacea- we could trial metronidazole gel in the future instead of above if not making improvement. Send me a Clinical cytogeneticist message or call if no progress in a month or two

## 2017-09-27 ENCOUNTER — Encounter: Payer: No Typology Code available for payment source | Admitting: Internal Medicine

## 2018-04-17 ENCOUNTER — Other Ambulatory Visit: Payer: Self-pay | Admitting: Family Medicine

## 2018-04-17 ENCOUNTER — Encounter: Payer: Self-pay | Admitting: Family Medicine

## 2018-04-17 NOTE — Telephone Encounter (Signed)
You may provide 1 more refill-if patient does not improve or if needs another refill-needs office visit

## 2018-04-17 NOTE — Telephone Encounter (Signed)
Copied from CRM [id number]. Topic: Quick Communication - Rx Refill/Question >> Apr 17, 2018  9:07 AM Wyonia HoughJohnson, Chaz E wrote: Medication: doxycycline (VIBRA-TABS) 100 MG tablet (out of meds) triamcinolone cream (KENALOG) 0.1 %  Has the patient contacted their pharmacy? No.  Preferred Pharmacy (with phone number or street name): [id number] - [place],  - [address] [phone] (Phone) [phone] (Fax)    advised that RX refills may take up to 3 business days. asked that he follow-up with his pharmacy.

## 2018-04-18 MED ORDER — TRIAMCINOLONE ACETONIDE 0.1 % EX CREA: 1.0000 "application " | TOPICAL_CREAM | Freq: Two times a day (BID) | CUTANEOUS | 0 refills | Status: DC

## 2019-04-14 IMAGING — US US EXTREM LOW VENOUS BILAT
1 series · 13 of 24 positions shown · non-contrast
Comparison: None.

CLINICAL DATA: The patient developed BILATERAL leg swelling after a
long automobile trip.



[Series 1: us extrem low venous bilat · 0.07mm/px · 13 of 49 slices shown]
[im 1/49]
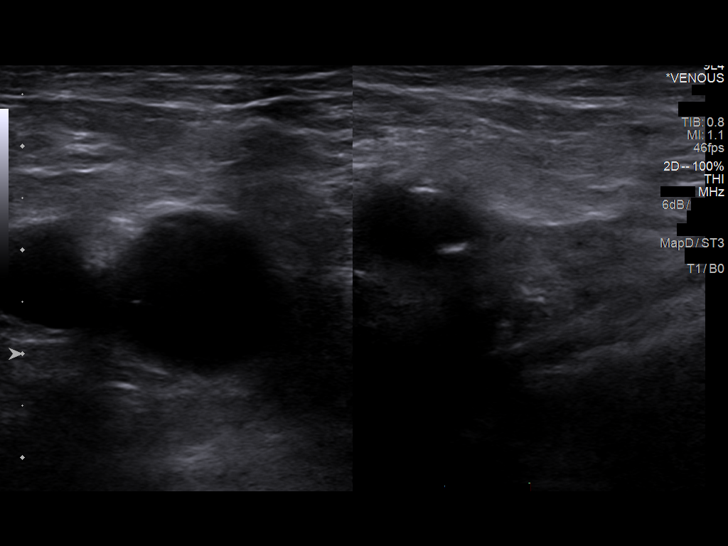
[im 5/49]
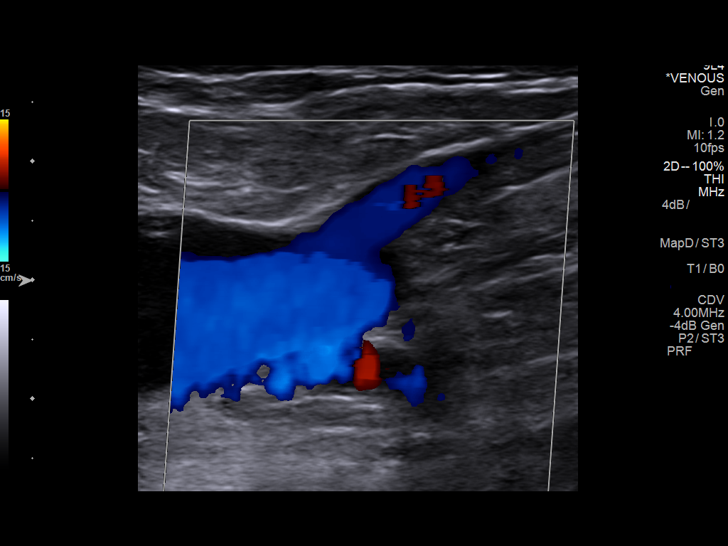
[im 9/49]
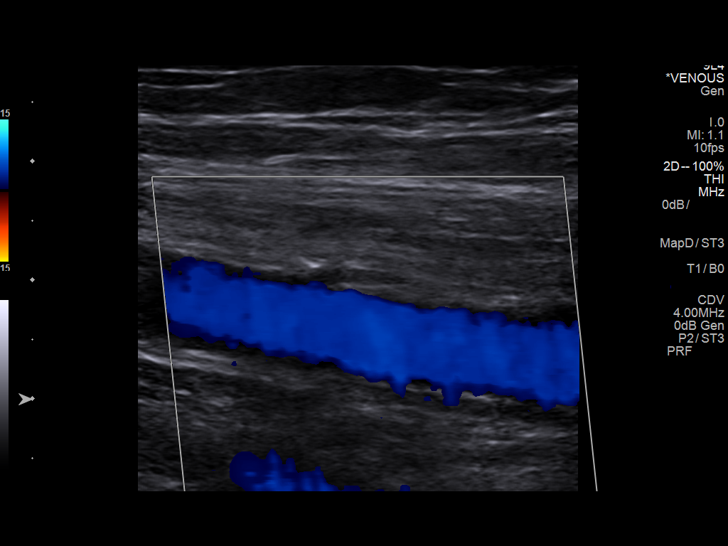
[im 13/49]
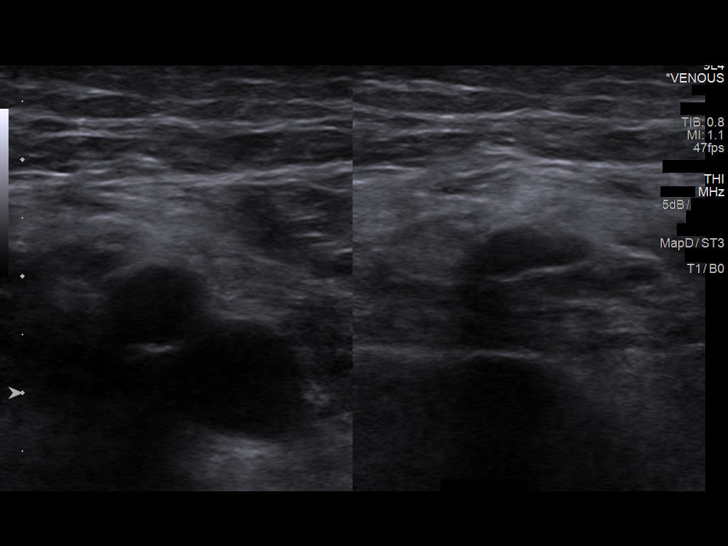
[im 17/49]
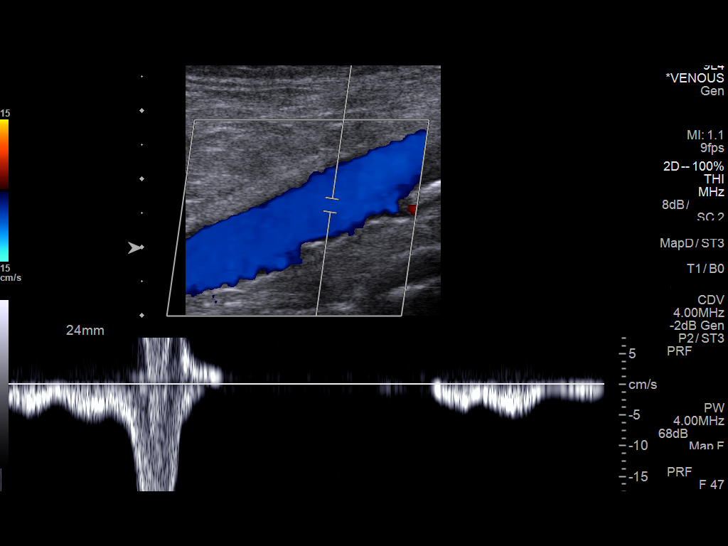
[im 21/49]
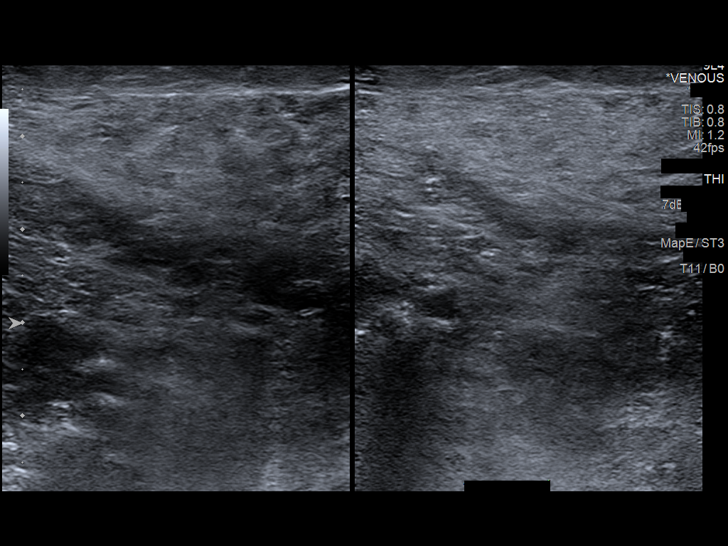
[im 26/49]
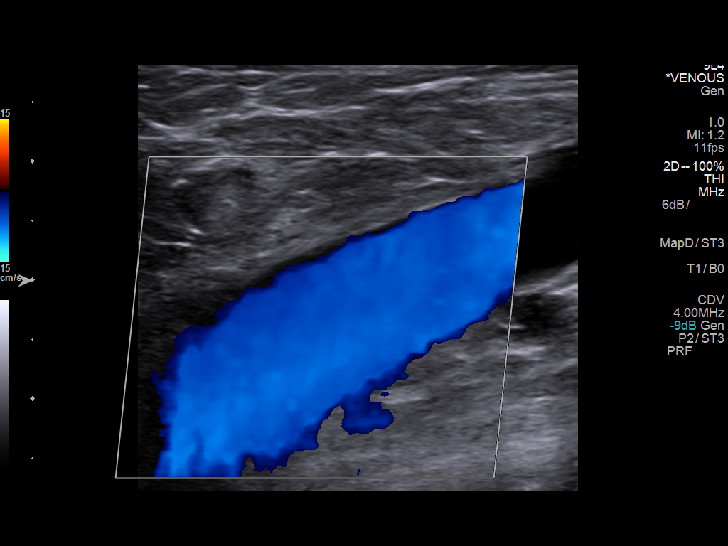
[im 28/49]
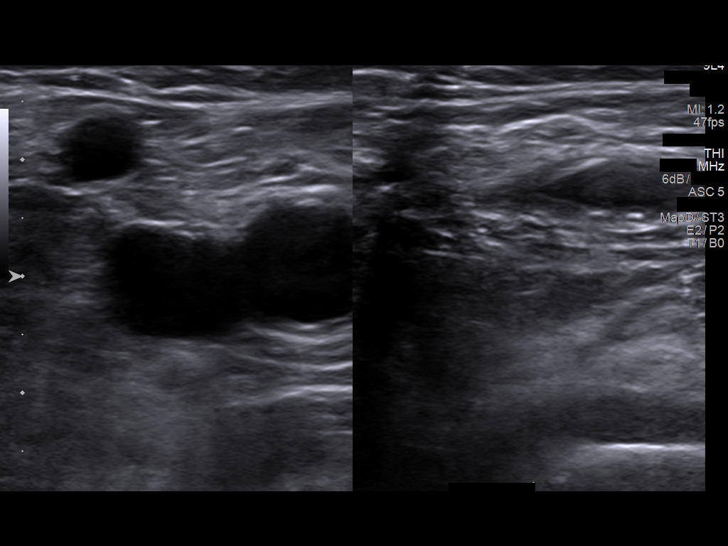
[im 32/49]
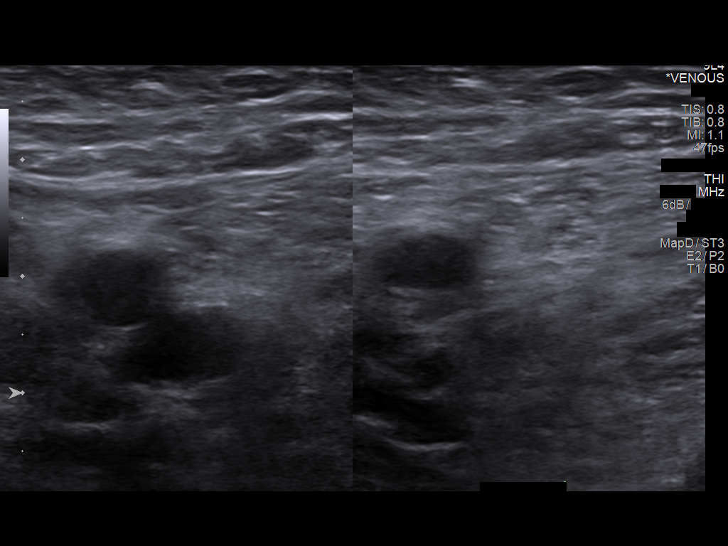
[im 36/49]
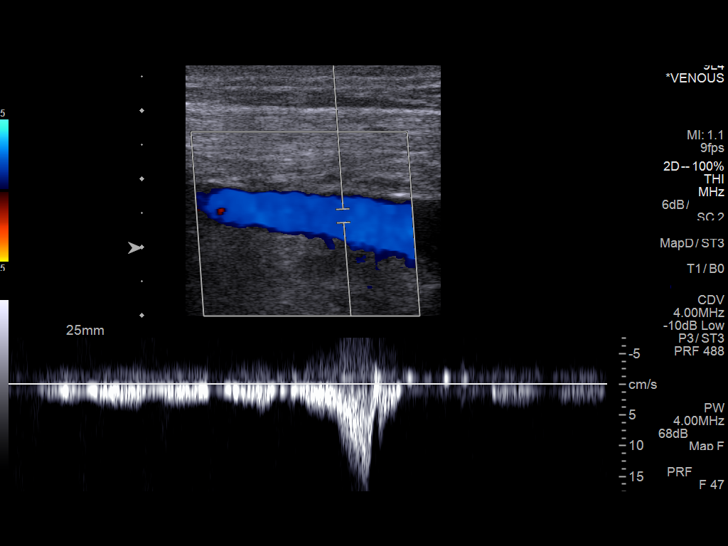
[im 40/49]
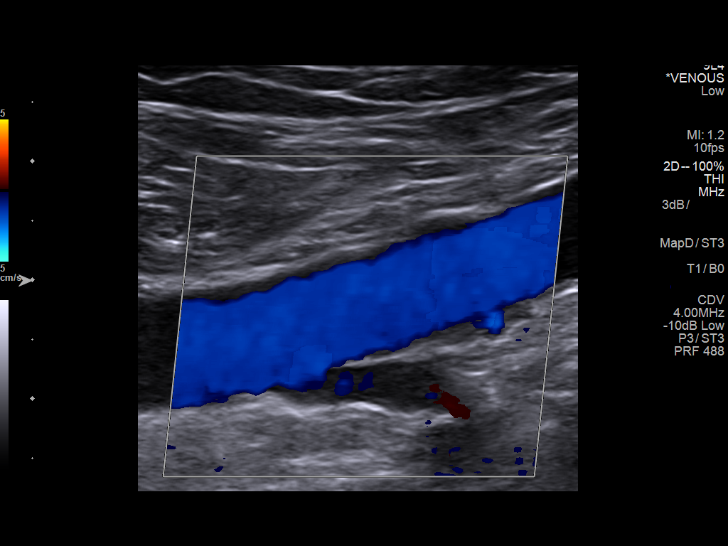
[im 44/49]
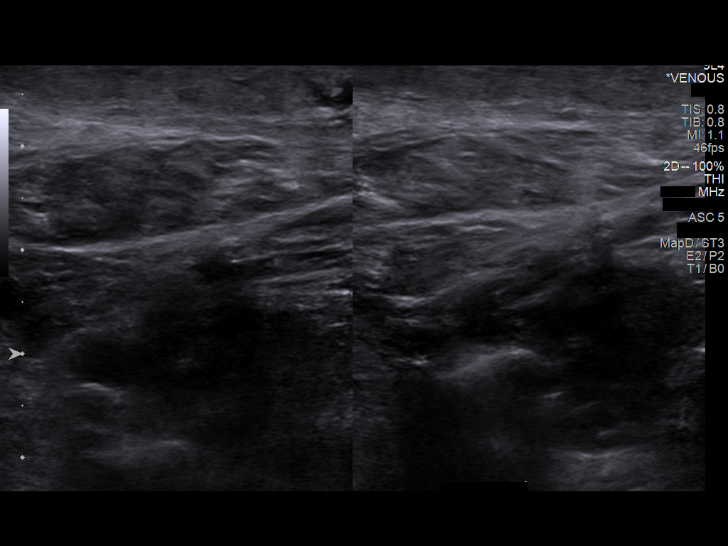
[im 49/49]
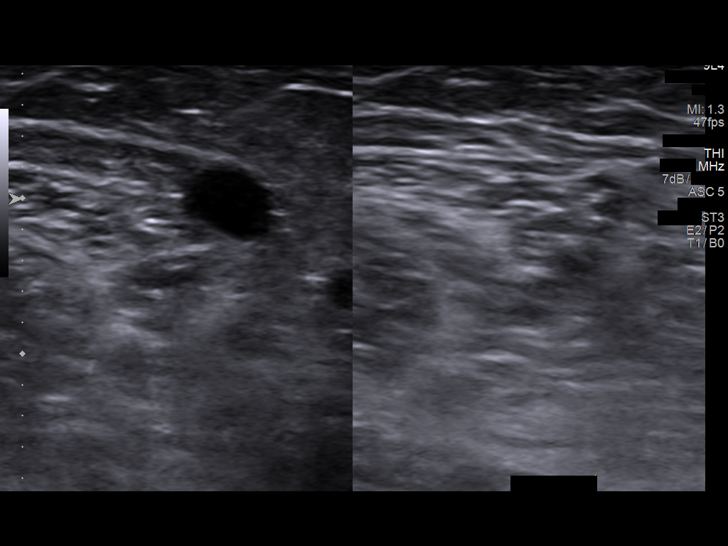

[13 of 24 positions shown; findings below may reference images not displayed]

FINDINGS: RIGHT LOWER EXTREMITY

Common Femoral Vein: No evidence of thrombus. Normal
compressibility, respiratory phasicity and response to augmentation.

Saphenofemoral Junction: No evidence of thrombus. Normal
compressibility and flow on color Doppler imaging.

Profunda Femoral Vein: No evidence of thrombus. Normal
compressibility and flow on color Doppler imaging.

Femoral Vein: No evidence of thrombus. Normal compressibility,
respiratory phasicity and response to augmentation.

Popliteal Vein: No evidence of thrombus. Normal compressibility,
respiratory phasicity and response to augmentation.

Calf Veins: Poor visualization, but no visible thrombus.

Superficial Great Saphenous Vein: No evidence of thrombus. Normal
compressibility.

Venous Reflux:  None.

Other Findings:  Calf edema.

LEFT LOWER EXTREMITY

Common Femoral Vein: No evidence of thrombus. Normal
compressibility, respiratory phasicity and response to augmentation.

Saphenofemoral Junction: No evidence of thrombus. Normal
compressibility and flow on color Doppler imaging.

Profunda Femoral Vein: No evidence of thrombus. Normal
compressibility and flow on color Doppler imaging.

Femoral Vein: No evidence of thrombus. Normal compressibility,
respiratory phasicity and response to augmentation.

Popliteal Vein: No evidence of thrombus. Normal compressibility,
respiratory phasicity and response to augmentation.

Calf Veins: No evidence of thrombus. Normal compressibility and flow
on color Doppler imaging.

Superficial Great Saphenous Vein: No evidence of thrombus. Normal
compressibility.

Venous Reflux:  None.

Other Findings:  Calf edema.
IMPRESSION: No evidence of deep venous thrombosis. BILATERAL RIGHT greater than
LEFT calf edema.

## 2019-07-05 IMAGING — US US ABDOMEN LIMITED
1 series · 14 of 25 positions shown · non-contrast
Comparison: None.

CLINICAL DATA: Alcohol abuse.

EXAM:
ULTRASOUND ABDOMEN LIMITED RIGHT UPPER QUADRANT

[Series 1: us abdomen limited · 0.20mm/px · 14 of 54 slices shown]
[im 1/54]
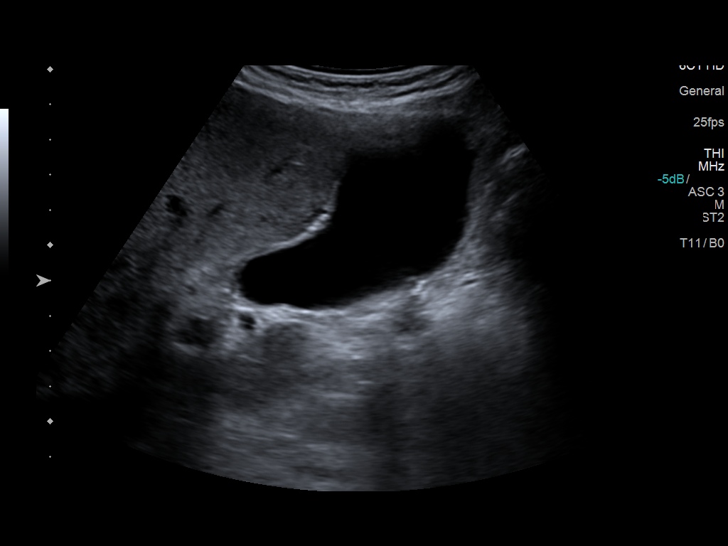
[im 5/54]
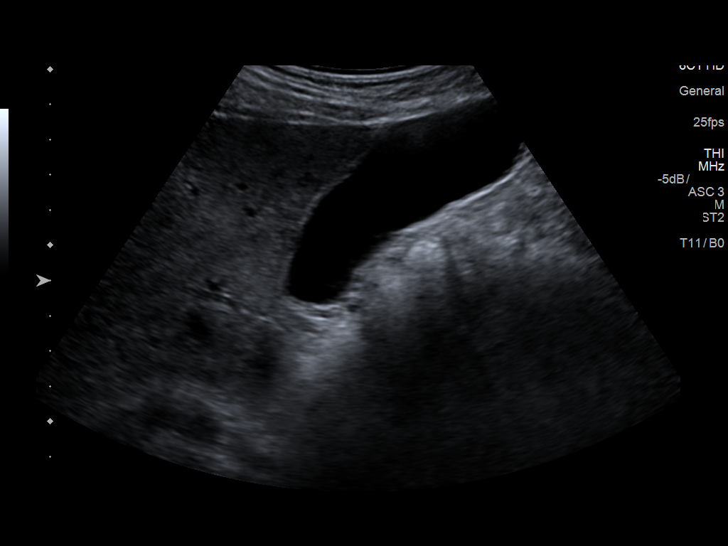
[im 9/54]
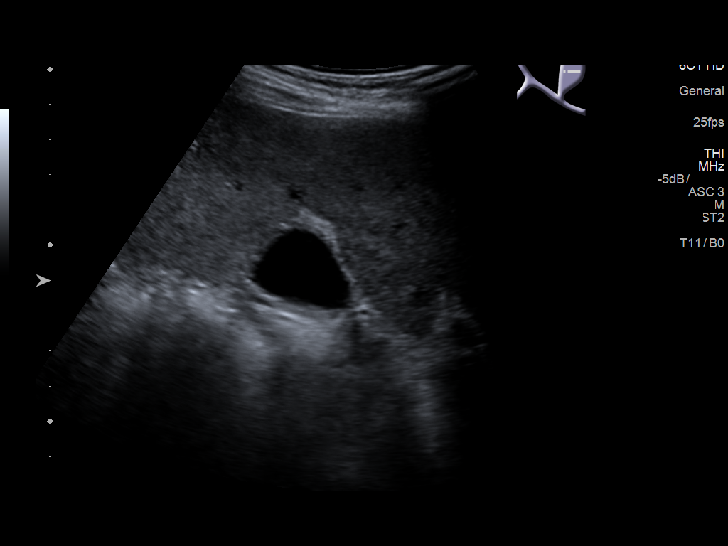
[im 14/54]
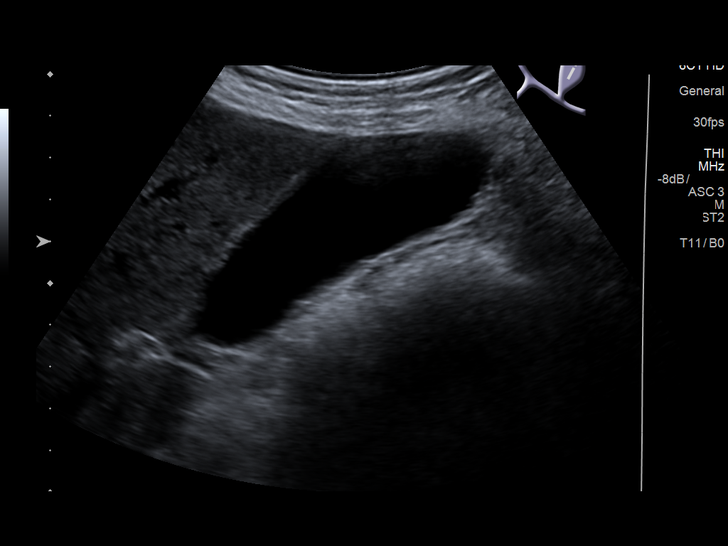
[im 18/54]
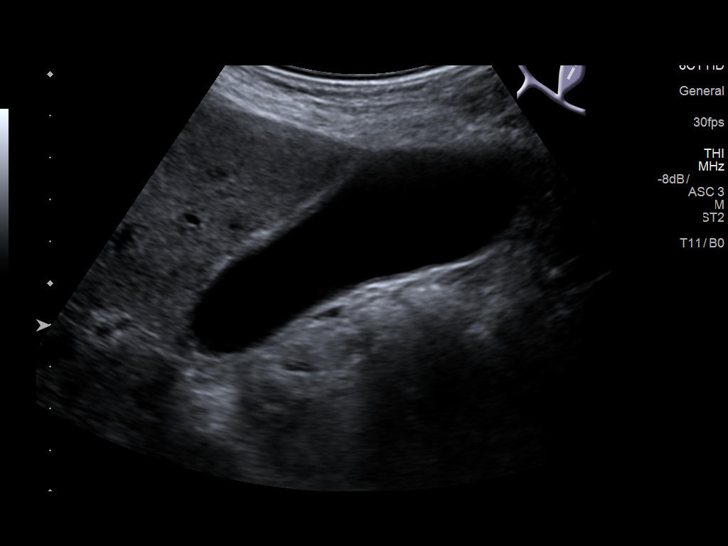
[im 20/54]
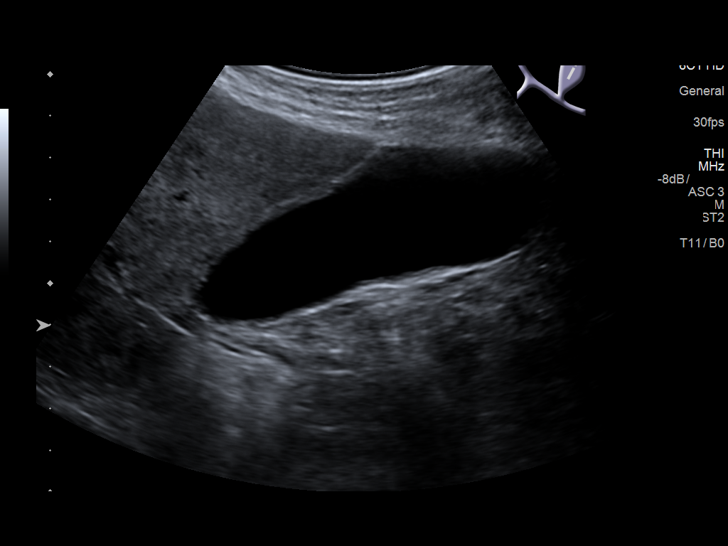
[im 25/54]
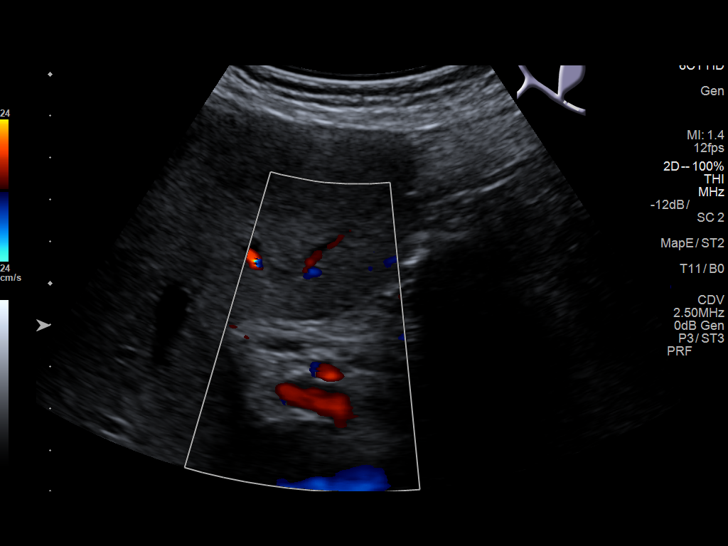
[im 29/54]
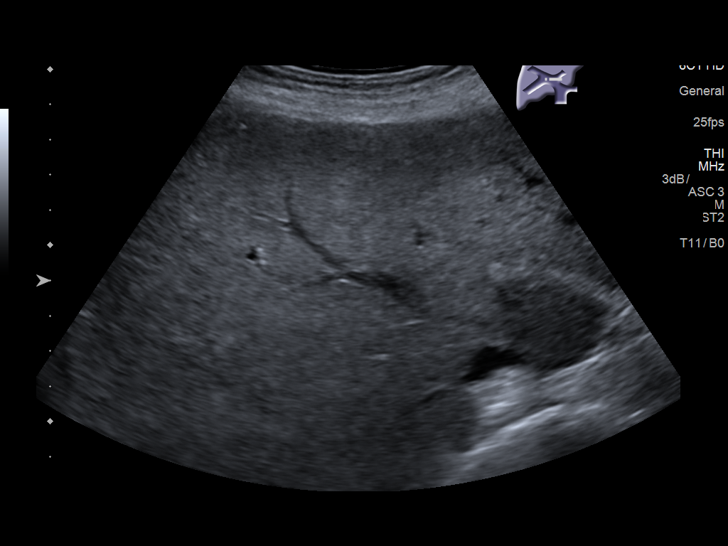
[im 34/54]
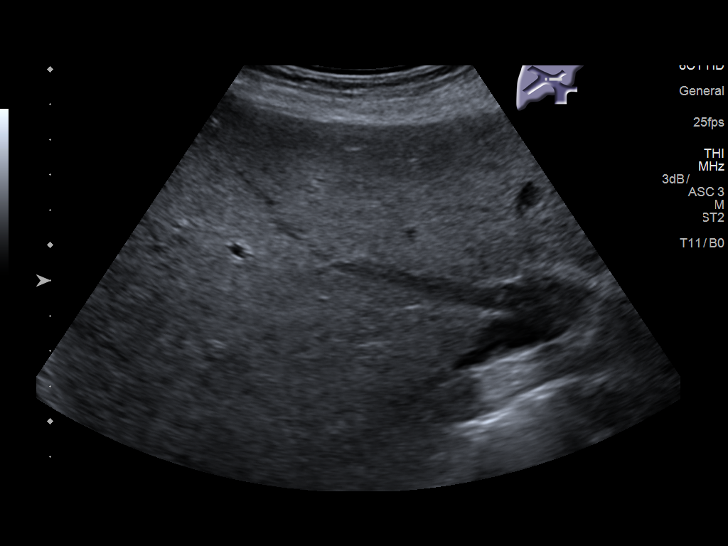
[im 36/54]
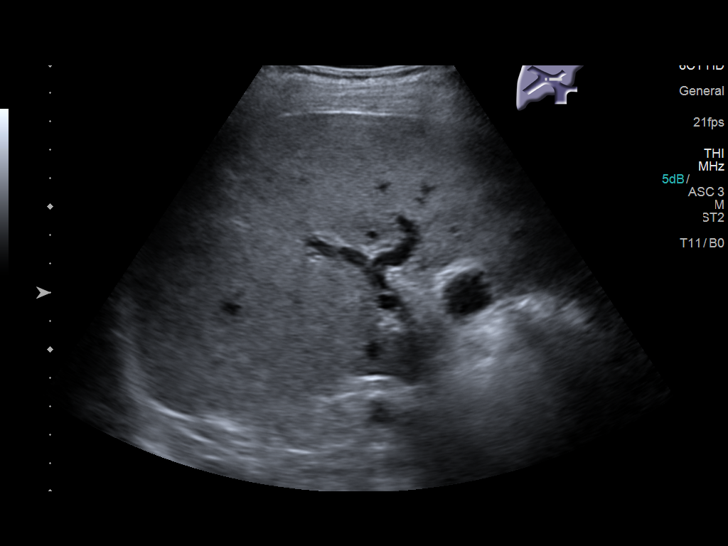
[im 40/54]
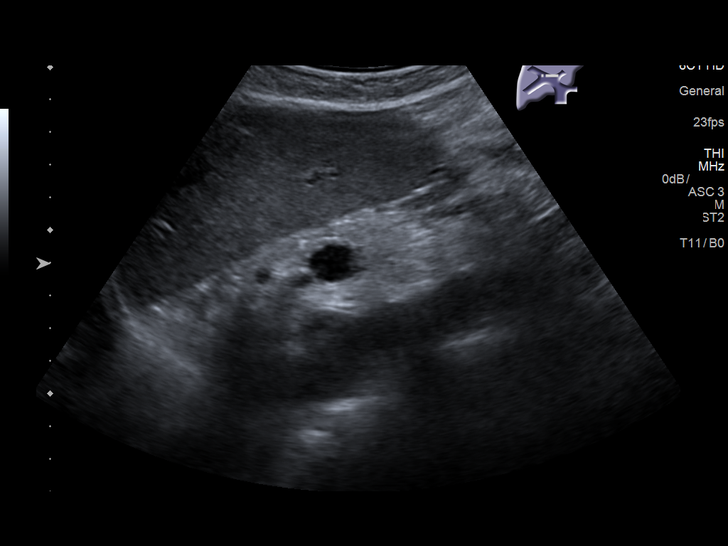
[im 45/54]
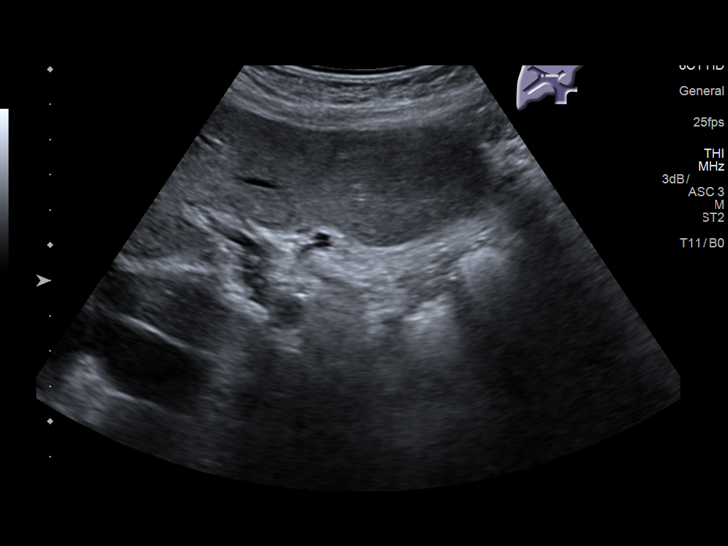
[im 49/54]
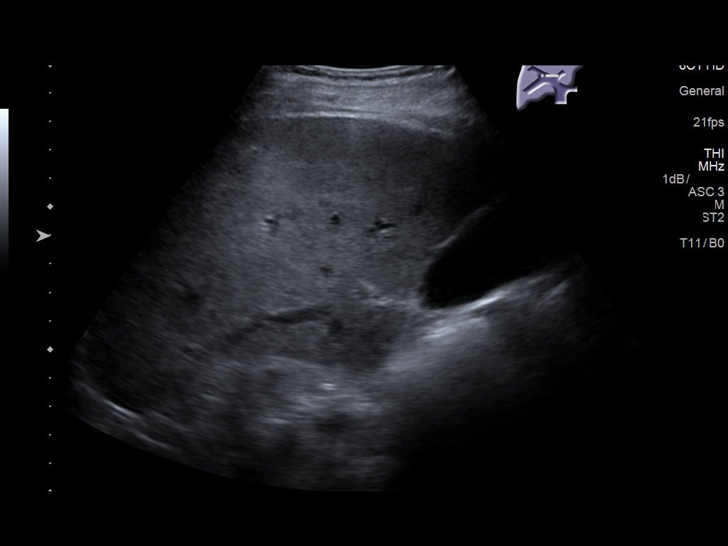
[im 54/54]
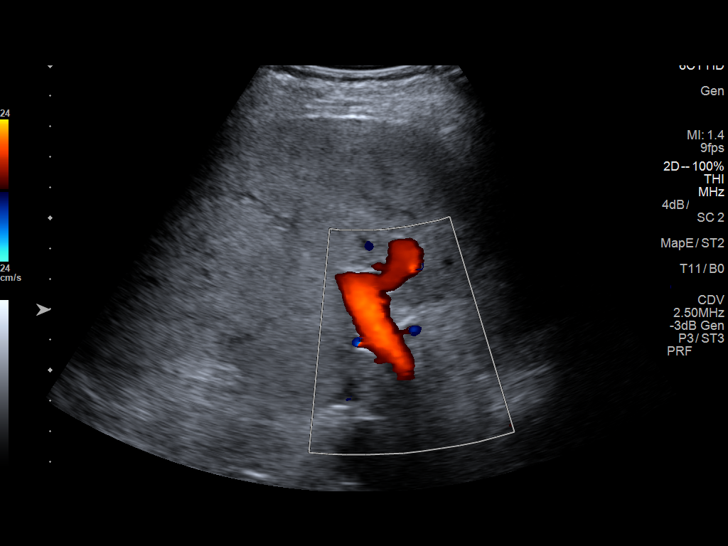

[14 of 25 positions shown; findings below may reference images not displayed]

FINDINGS: Gallbladder:

No gallstones or wall thickening visualized. No sonographic Murphy
sign noted by sonographer. Small 3.6 mm polyp is noted.

Common bile duct:

Diameter: 3.3 mm which is within normal limits.

Liver:

No focal lesion identified. Increased echotexture of hepatic
parenchyma is noted suggesting fatty infiltration or other diffuse
hepatocellular disease. The liver appears to be grossly normal in
size. Portal vein is patent on color Doppler imaging with normal
direction of blood flow towards the liver.
IMPRESSION: Increased echogenicity of hepatic parenchyma is noted most
consistent with fatty infiltration or other diffuse hepatocellular
disease.

Small gallbladder polyp is noted.

No other abnormality seen in the right upper quadrant of the
abdomen.

## 2019-08-21 ENCOUNTER — Ambulatory Visit: Payer: No Typology Code available for payment source | Admitting: Family Medicine

## 2022-02-01 ENCOUNTER — Encounter: Payer: Self-pay | Admitting: *Deleted

## 2022-04-22 ENCOUNTER — Encounter: Payer: Self-pay | Admitting: *Deleted

## 2022-10-11 ENCOUNTER — Emergency Department (HOSPITAL_COMMUNITY): Payer: No Typology Code available for payment source

## 2022-10-11 ENCOUNTER — Inpatient Hospital Stay (HOSPITAL_COMMUNITY)
Admission: EM | Admit: 2022-10-11 | Discharge: 2022-10-16 | DRG: 956 | Disposition: A | Discharge status: Skilled Nursing Facility | Payer: No Typology Code available for payment source | Attending: Family Medicine | Admitting: Family Medicine

## 2022-10-11 ENCOUNTER — Inpatient Hospital Stay (HOSPITAL_COMMUNITY): Payer: No Typology Code available for payment source | Admitting: Anesthesiology

## 2022-10-11 ENCOUNTER — Encounter (HOSPITAL_COMMUNITY): Payer: Self-pay | Admitting: Internal Medicine

## 2022-10-11 DIAGNOSIS — F1021 Alcohol dependence, in remission: Secondary | ICD-10-CM

## 2022-10-11 DIAGNOSIS — S72141A Displaced intertrochanteric fracture of right femur, initial encounter for closed fracture: Principal | ICD-10-CM | POA: Diagnosis present

## 2022-10-11 DIAGNOSIS — F03918 Unspecified dementia, unspecified severity, with other behavioral disturbance: Secondary | ICD-10-CM | POA: Diagnosis present

## 2022-10-11 DIAGNOSIS — W1830XA Fall on same level, unspecified, initial encounter: Secondary | ICD-10-CM | POA: Diagnosis present

## 2022-10-11 DIAGNOSIS — G319 Degenerative disease of nervous system, unspecified: Secondary | ICD-10-CM | POA: Diagnosis present

## 2022-10-11 DIAGNOSIS — S0636AA Traumatic hemorrhage of cerebrum, unspecified, with loss of consciousness status unknown, initial encounter: Secondary | ICD-10-CM | POA: Diagnosis present

## 2022-10-11 DIAGNOSIS — I10 Essential (primary) hypertension: Secondary | ICD-10-CM | POA: Diagnosis present

## 2022-10-11 DIAGNOSIS — E876 Hypokalemia: Secondary | ICD-10-CM | POA: Diagnosis present

## 2022-10-11 DIAGNOSIS — F0390 Unspecified dementia without behavioral disturbance: Secondary | ICD-10-CM | POA: Diagnosis not present

## 2022-10-11 DIAGNOSIS — F17200 Nicotine dependence, unspecified, uncomplicated: Secondary | ICD-10-CM

## 2022-10-11 DIAGNOSIS — Z8042 Family history of malignant neoplasm of prostate: Secondary | ICD-10-CM | POA: Diagnosis not present

## 2022-10-11 DIAGNOSIS — S72001A Fracture of unspecified part of neck of right femur, initial encounter for closed fracture: Secondary | ICD-10-CM | POA: Diagnosis present

## 2022-10-11 DIAGNOSIS — Y92009 Unspecified place in unspecified non-institutional (private) residence as the place of occurrence of the external cause: Secondary | ICD-10-CM

## 2022-10-11 DIAGNOSIS — F17290 Nicotine dependence, other tobacco product, uncomplicated: Secondary | ICD-10-CM | POA: Diagnosis present

## 2022-10-11 DIAGNOSIS — E871 Hypo-osmolality and hyponatremia: Secondary | ICD-10-CM | POA: Diagnosis present

## 2022-10-11 DIAGNOSIS — Z79899 Other long term (current) drug therapy: Secondary | ICD-10-CM | POA: Diagnosis not present

## 2022-10-11 DIAGNOSIS — I615 Nontraumatic intracerebral hemorrhage, intraventricular: Secondary | ICD-10-CM | POA: Diagnosis not present

## 2022-10-11 DIAGNOSIS — H269 Unspecified cataract: Secondary | ICD-10-CM | POA: Diagnosis present

## 2022-10-11 DIAGNOSIS — W19XXXA Unspecified fall, initial encounter: Secondary | ICD-10-CM

## 2022-10-11 DIAGNOSIS — E559 Vitamin D deficiency, unspecified: Secondary | ICD-10-CM | POA: Diagnosis present

## 2022-10-11 DIAGNOSIS — S72001D Fracture of unspecified part of neck of right femur, subsequent encounter for closed fracture with routine healing: Secondary | ICD-10-CM | POA: Diagnosis not present

## 2022-10-11 DIAGNOSIS — R54 Age-related physical debility: Secondary | ICD-10-CM | POA: Diagnosis present

## 2022-10-11 DIAGNOSIS — Z66 Do not resuscitate: Secondary | ICD-10-CM | POA: Diagnosis present

## 2022-10-11 DIAGNOSIS — F17210 Nicotine dependence, cigarettes, uncomplicated: Secondary | ICD-10-CM | POA: Diagnosis not present

## 2022-10-11 HISTORY — DX: Unspecified dementia, unspecified severity, without behavioral disturbance, psychotic disturbance, mood disturbance, and anxiety: F03.90

## 2022-10-11 LAB — CBC
HCT: 41.3 % (ref 39.0–52.0)
Hemoglobin: 14 g/dL (ref 13.0–17.0)
MCH: 33.3 pg (ref 26.0–34.0)
MCHC: 33.9 g/dL (ref 30.0–36.0)
MCV: 98.3 fL (ref 80.0–100.0)
Platelets: 178 10*3/uL (ref 150–400)
RBC: 4.2 MIL/uL — ABNORMAL LOW (ref 4.22–5.81)
RDW: 12.3 % (ref 11.5–15.5)
WBC: 13.4 10*3/uL — ABNORMAL HIGH (ref 4.0–10.5)
nRBC: 0 % (ref 0.0–0.2)

## 2022-10-11 LAB — BASIC METABOLIC PANEL
Anion gap: 10 (ref 5–15)
BUN: 11 mg/dL (ref 8–23)
CO2: 22 mmol/L (ref 22–32)
Calcium: 8.8 mg/dL — ABNORMAL LOW (ref 8.9–10.3)
Chloride: 102 mmol/L (ref 98–111)
Creatinine, Ser: 0.81 mg/dL (ref 0.61–1.24)
GFR, Estimated: >60 mL/min (ref >60)
Glucose, Bld: 126 mg/dL — ABNORMAL HIGH (ref 70–99)
Potassium: 3.4 mmol/L — ABNORMAL LOW (ref 3.5–5.1)
Sodium: 134 mmol/L — ABNORMAL LOW (ref 135–145)

## 2022-10-11 LAB — ABO/RH: ABO/RH(D): O POS

## 2022-10-11 LAB — TYPE AND SCREEN
ABO/RH(D): O POS
Antibody Screen: NEGATIVE

## 2022-10-11 MED ORDER — ACETAMINOPHEN 500 MG PO TABS
1000.0000 mg | ORAL_TABLET | Freq: Three times a day (TID) | ORAL | Status: DC
  Administered 2022-10-12 (×2): 1000 mg via ORAL
  Filled 2022-10-11 (×2): qty 2

## 2022-10-11 MED ORDER — MORPHINE SULFATE (PF) 2 MG/ML IV SOLN: 0.5000 mg | INTRAVENOUS | Status: DC | PRN

## 2022-10-11 MED ORDER — OXYCODONE HCL 5 MG PO TABS: 5.0000 mg | ORAL_TABLET | Freq: Four times a day (QID) | ORAL | Status: DC | PRN

## 2022-10-11 MED ORDER — POTASSIUM CHLORIDE 2 MEQ/ML IV SOLN
INTRAVENOUS | Status: AC
  Filled 2022-10-11: qty 1000

## 2022-10-11 MED ORDER — POTASSIUM CHLORIDE CRYS ER 20 MEQ PO TBCR: 40.0000 meq | EXTENDED_RELEASE_TABLET | Freq: Once | ORAL | Status: DC

## 2022-10-11 MED ORDER — ROPIVACAINE HCL 5 MG/ML IJ SOLN
INTRAMUSCULAR | Status: DC | PRN
  Administered 2022-10-11: 30 mL via PERINEURAL

## 2022-10-11 NOTE — ED Triage Notes (Addendum)
PT BIB EMS from home after an unwitnessed fall onto right side at 0630 without LOC. Pt's wife had help form neighbor to get pt off floor and onto couch. Pt has posterior head hematoma. At 0800 pt was unable to get off of couch d/t pain. HX dementia.   EMS VS 129/75 81 HR 97% 14

## 2022-10-11 NOTE — ED Provider Notes (Signed)
[place] [facility] AT [facility] Provider Note   CSN: [id number] Arrival date & time: 10/11/22  1000     History  Chief Complaint  Patient presents with   Fall    Possible posterior head trauma    Tanner Bond is a 67 y.o. male.  HPI All 5 caveat secondary to dementia 67 year old male with dementia presents from home after fall.  His wife states that he was in the other room.  She heard him fall.  She reports that she found him on the floor.  She noticed a contusion to his head.  She had to have help getting him back up off the floor.  After she got back on the couch she complained of pain in his right hip.  He presents secondary to right hip pain after falling.  He is not reported to be on any blood thinners and there is no extended loss of consciousness.     Home Medications Prior to Admission medications   Medication Sig Start Date End Date Taking? Authorizing Provider  clindamycin-benzoyl peroxide (BENZACLIN) gel Apply topically 2 (two) times daily. Use white towels- can bleach towels/clothes 09/26/17   Shelva Majestic, MD  doxycycline (VIBRA-TABS) 100 MG tablet TAKE 1 TABLET(100 MG) BY MOUTH TWICE DAILY office visit before next refill 04/18/18   Shelva Majestic, MD  traZODone (DESYREL) 50 MG tablet Take 0.5-1 tablets (25-50 mg total) by mouth at bedtime as needed for sleep. 07/05/17   Shelva Majestic, MD  triamcinolone cream (KENALOG) 0.1 % Apply 1 application topically 2 (two) times daily. Take 1 week break after 1 week of use 04/18/18   Shelva Majestic, MD      Allergies    Patient has no known allergies.    Review of Systems   Review of Systems  Physical Exam Updated Vital Signs BP 115/69   Pulse (!) 108   Temp 99.6 F (37.6 C) (Oral)   Resp 16   Ht 1.829 m (6')   Wt 75.5 kg   SpO2 (!) 83%   BMI 22.57 kg/m  Physical Exam Vitals and nursing note reviewed.  Constitutional:      General: He is not in acute distress.     Appearance: Normal appearance.  HENT:     Right Ear: External ear normal.     Left Ear: External ear normal.     Mouth/Throat:     Pharynx: Oropharynx is clear.  Eyes:     Pupils: Pupils are equal, round, and reactive to light.  Cardiovascular:     Rate and Rhythm: Normal rate.     Pulses: Normal pulses.  Pulmonary:     Effort: Pulmonary effort is normal.  Abdominal:     General: Abdomen is flat. Bowel sounds are normal.  Musculoskeletal:     Cervical back: Normal range of motion.     Comments: Some tenderness to right hip Back visually inspected with no obvious external signs of trauma No tenderness palpation  Neurological:     Mental Status: He is alert.     ED Results / Procedures / Treatments   Labs (all labs ordered are listed, but only abnormal results are displayed) Labs Reviewed  CBC - Abnormal; Notable for the following components:      Result Value   WBC 13.4 (*)    RBC 4.20 (*)    All other components within normal limits  BASIC METABOLIC PANEL - Abnormal; Notable for the following components:  Sodium 134 (*)    Potassium 3.4 (*)    Glucose, Bld 126 (*)    Calcium 8.8 (*)    All other components within normal limits    EKG EKG Interpretation  Date/Time:  Monday October 11 2022 10:23:06 EDT Ventricular Rate:  79 PR Interval:  193 QRS Duration: 92 QT Interval:  375 QTC Calculation: 430 R Axis:   46 Text Interpretation: Sinus rhythm RSR' in V1 or V2, probably normal variant No old tracing to compare Confirmed by Margarita Grizzle [phone]) on 10/11/2022 5:20:54 PM  Radiology CT Hip Right Wo Contrast  Result Date: 10/11/2022 CLINICAL DATA:  Hip trauma.  Fracture suspected.  Fall. EXAM: CT OF THE RIGHT HIP WITHOUT CONTRAST TECHNIQUE: Multidetector CT imaging of the right hip was performed according to the standard protocol. Multiplanar CT image reconstructions were also generated. RADIATION DOSE REDUCTION: This exam was performed according to the departmental  dose-optimization program which includes automated exposure control, adjustment of the mA and/or kV according to patient size and/or use of iterative reconstruction technique. COMPARISON:  Pelvis and right hip radiographs 10/11/2022 FINDINGS: Bones/Joint/Cartilage There is an oblique fracture extending from the anterior inferior aspect of the greater trochanter through the anterior aspect of the lesser trochanter (coronal series 4 images 27 through 30). There is up to 9 mm transverse dimension diastasis within the anterior superior aspect of this fracture but no significant diastasis within the inferior aspect of this fracture. There is an additional nondisplaced oblique fracture within the lesser tuberosity (axial series 2, images 47-51). Acute fracture of the far posterior aspect of the femoral neck-greater trochanter junction (axial series 3, image 123 and coronal series 4, image 34). There is mild anterior apex angulation of the dominant anterior intertrochanteric and posterior distal femoral neck fractures. Mild-to-moderate right greater than left sacroiliac joint space narrowing, subchondral sclerosis, and anterior peripheral osteophytosis. The pubic symphysis joint space is maintained. Ligaments Suboptimally assessed by CT. Muscles and Tendons There is mildly decreased definition within the just psoas musculotendinous junction which may be secondary to mild posttraumatic interstitial muscle tearing and/or blood products (axial series 2 images 45 through 48). No gross tendon tear is visualized. Soft tissues Moderate partially visualized sigmoid diverticulosis. IMPRESSION: 1. Acute oblique fracture extending from the anterior inferior aspect of the greater trochanter through the anterior aspect of the lesser trochanter. There is up to 9 mm transverse dimension diastasis within the anterior superior aspect of this fracture but no significant diastasis within the inferior aspect of this fracture. 2. Additional  nondisplaced acute oblique fracture within the lesser tuberosity. 3. Acute fracture of the far posterior aspect of the distal femoral neck at the junction with the greater trochanter. There is mild anterior apex angulation of the dominant anterior intertrochanteric and posterior distal femoral neck fractures. Electronically Signed   By: Neita Garnet M.D.   On: 10/11/2022 16:40   CT Head Wo Contrast  Result Date: 10/11/2022 CLINICAL DATA:  67 year old male with head injury. EXAM: CT HEAD WITHOUT CONTRAST TECHNIQUE: Contiguous axial images were obtained from the base of the skull through the vertex without intravenous contrast. RADIATION DOSE REDUCTION: This exam was performed according to the departmental dose-optimization program which includes automated exposure control, adjustment of the mA and/or kV according to patient size and/or use of iterative reconstruction technique. COMPARISON:  None Available. FINDINGS: Brain: A 3 mm hyperdensity within the occipital horn of the LEFT LATERAL ventricle is noted compatible with a tiny amount of intraventricular hemorrhage. No other hemorrhage  identified. There is no evidence of mass lesion or mass effect, hydrocephalus, extra-axial collection, acute infarct or midline shift. Atrophy and probable mild chronic small vessel white matter ischemic changes are noted. Vascular: Carotid atherosclerotic calcifications are noted. Skull: Normal. Negative for fracture or focal lesion. Sinuses/Orbits: No acute finding. Other: None. IMPRESSION: 1. Tiny amount of intraventricular hemorrhage within LEFT occipital horn. No other acute abnormality identified. 2. Atrophy and probable mild chronic small vessel white matter ischemic changes. Critical Value/emergent results were called by telephone at the time of interpretation on 10/11/2022 at 1:27 pm to provider [facility] Jalina Blowers , who verbally acknowledged these results. Electronically Signed   By: Harmon Pier M.D.   On: 10/11/2022 13:27   CT  Cervical Spine Wo Contrast  Result Date: 10/11/2022 CLINICAL DATA:  Neck trauma (Age >= 65y) EXAM: CT CERVICAL SPINE WITHOUT CONTRAST TECHNIQUE: Multidetector CT imaging of the cervical spine was performed without intravenous contrast. Multiplanar CT image reconstructions were also generated. RADIATION DOSE REDUCTION: This exam was performed according to the departmental dose-optimization program which includes automated exposure control, adjustment of the mA and/or kV according to patient size and/or use of iterative reconstruction technique. COMPARISON:  None Available. FINDINGS: Alignment: Mild straightening of the normal cervical lordosis. Skull base and vertebrae: No acute fracture. No primary bone lesion or focal pathologic process. Soft tissues and spinal canal: No prevertebral fluid or swelling. No visible canal hematoma. Disc levels:  No evidence of high-grade spinal canal stenosis Upper chest: Negative. Other: None. IMPRESSION: No acute fracture or traumatic subluxation of the cervical spine. Electronically Signed   By: Lorenza Cambridge M.D.   On: 10/11/2022 11:17   DG Hip Unilat W or Wo Pelvis 2-3 Views Right  Result Date: 10/11/2022 CLINICAL DATA:  Pain after fall EXAM: DG HIP (WITH OR WITHOUT PELVIS) 3V RIGHT COMPARISON:  None Available. FINDINGS: Oblique lucency along the intertrochanteric region of the right hip. Nondisplaced fracture is possible. Recommend further characterization with CT. Otherwise no additional fracture or dislocation. Preserved joint spaces. There is global osteopenia. Well rounded densities in the seen in the pelvis consistent with phleboliths. There also other vascular calcifications noted. IMPRESSION: Oblique lucency along the intertrochanteric region of the right hip. Nondisplaced fracture is possible. Recommend further characterization with CT. Electronically Signed   By: Karen Kays M.D.   On: 10/11/2022 11:03    Procedures .Critical Care  Performed by: Margarita Grizzle,  MD Authorized by: Margarita Grizzle, MD   Critical care provider statement:    Critical care time (minutes):  45   Critical care was time spent personally by me on the following activities:  Development of treatment plan with patient or surrogate, discussions with consultants, evaluation of patient's response to treatment, examination of patient, ordering and review of laboratory studies, ordering and review of radiographic studies, ordering and performing treatments and interventions, pulse oximetry, re-evaluation of patient's condition and review of old charts     Medications Ordered in ED Medications - No data to display  ED Course/ Medical Decision Making/ A&P Clinical Course as of 10/11/22 1723  Mon Oct 11, 2022  1325 CT Hip Right Wo Contrast [DR]  1326 CT Hip Right Wo Contrast [DR]  1715 Received sign out pending hospitalist admission. Had fall. Has tiny intraventricular hemorrhage as well as right hip fracture. Dr. Yevette Edwards consulting for hip fracture. Dr. Wynetta Emery consulting for ICH. Cram recommended repeat head CT in 12-24 hrs. [WS]  1720 CBC reviewed interpreted and significant for mild leukocytosis with white  blood cell count of 13,400 otherwise within normal limits [DR]  1721 Basic metabolic panel is reviewed interpreted and significant for mild hyponatremia and hypokalemia [DR]    Clinical Course User Index [DR] Margarita Grizzle, MD [WS] Lonell Grandchild, MD                             Medical Decision Making Amount and/or Complexity of Data Reviewed Labs: ordered. Radiology: ordered. Decision-making details documented in ED Course.   67 year old male history of dementia with mechanical fall today.  He did strike his head but did not lose consciousness With pain in right hip Imaging obtained that shows small hyperdense area in the occipital horn of the left lateral ventricle, discussed with Dr. Si Gaul, consistent with tiny amount of intraventricular hemorrhage, discussed with Dr.  Wynetta Emery who advises patient can have repeat head CT and is unlikely to need any further intervention Right hip x-Halley Kincer is significant for fracture.  Discussed care with Dr. Yevette Edwards he will see for repair Patient [facility] at the [facility] and unassigned medicine has been paged for admission 1- mechanical fall 2- right intertroch hip fx 3-small intraventricular hemmorhage        Final Clinical Impression(s) / ED Diagnoses Final diagnoses:  Fall, initial encounter  Closed right hip fracture, initial encounter (HCC)  Intraventricular hemorrhage (HCC)    Rx / DC Orders ED Discharge Orders     None         Margarita Grizzle, MD 10/11/22 1723

## 2022-10-11 NOTE — H&P (Signed)
History and Physical    Tanner Bond ZOX:[id number] DOB: [birth year] DOA: 10/11/2022  PCP: [name]., MD   I have briefly reviewed patients previous medical reports in [facility].  Patient coming from: Home  Chief Complaint: Right hip pain following fall at home  HPI: Tanner Bond is a 67 year old married male, lives with his spouse, ambulates slowly but without assistance, medical history significant for advanced dementia with behavioral changes, s/p appendectomy, right ankle fracture treated nonsurgically, alcohol use disorder in remission, tobacco use, (spouse denies documented history of HTN), brought in by ambulance to [facility] ED on 10/11/2022 following unwitnessed fall at home on 10/11/2022 at approximately 6:30 AM.  Patient is unable to provide any history due to advanced dementia and mental status changes.  History is obtained from patient's spouse/healthcare power of attorney at bedside.  She was woken up at around 6:30 AM on day of admission when she heard a fall.  She thought maybe one of her cats had knocked off a chair or something.  She then found the patient down on the floor on his right hip with right lower extremity folded.  No loss of consciousness or bleeding.  No seizure-like activity.  She was unable to lift him up.  With the assistance of a neighbor, they were able to get him up to the couch.  He did not report of much pain.  They sat on the couch for approximately 1 to 2 hours after which she wanted to use the toilet but was unable to get up.  Spouse then reached out to her brother-in-law and when they tried to get him up, he reported significant pain in the right hip and could not ambulate.  Thereby EMS was activated.  ED Course: Vital signs stable.  Lab work significant for sodium of 134, potassium of 3.4, WBC of 13.4.  Imaging confirmed right hip fracture (details as below) and small intraventricular hemorrhage (details as below).  EDP discussed  with neurosurgery/Dr. Wynetta Emery who recommended repeat head CT in 12 to 24 hours and unlikely to need any further intervention.  EDP also discussed with Dr. Yevette Edwards, orthopedics who will see in consultation for hip surgery.   Review of Systems:  All other systems reviewed and apart from HPI, are negative.  Unable to perform detailed ROS due to patient's mental status changes.  However as per discussion with spouse, no history of chest pain, dyspnea, dizziness, lightheadedness, palpitations reported.  She does report that patient has advanced dementia, many times does not recognize family members including her, has tendency to wander and get agitated for which she is on polypharmacy.  Past Medical History:  Diagnosis Date   Alcohol abuse    Liver dysfunction     Past Surgical History:  Procedure Laterality Date   APPENDECTOMY     as child   URETHRAL DILATION     as child    Social History  reports that he has been smoking cigars. He has never used smokeless tobacco. He reports current alcohol use. He reports that he does not use drugs.  Per spouse, he used to drink heavily in the past but not so for the last couple of years.  She allows him to drink infrequently and the last time he had a beer 3 weekends ago when his friend came to visit him.  He smokes cigarettes in the past for several years but quit.  Of late he smokes cigars when able but has not done  so again in couple of months.  No Known Allergies  Family History  Problem Relation Age of Onset   Dementia Mother    Prostate cancer Father      Prior to Admission medications   Medication Sig Start Date End Date Taking? Authorizing Provider  busPIRone (BUSPAR) 10 MG tablet Take 10 mg by mouth 3 (three) times daily.   Yes [provider]  donepezil (ARICEPT) 10 MG tablet Take 10 mg by mouth at bedtime.   Yes [provider]  memantine (NAMENDA) 10 MG tablet Take 5 mg by mouth 2 (two) times daily.   Yes [provider]  Multiple Vitamins-Minerals (CENTRUM SILVER 50+MEN) TABS Take 1 tablet by mouth daily.   Yes [provider]  Omega-3 Fatty Acids (FISH OIL ADULT GUMMIES PO) Take 1 capsule by mouth daily.   Yes [provider]  QUEtiapine (SEROQUEL) 25 MG tablet Take 25 mg by mouth at bedtime.   Yes [provider]  sertraline (ZOLOFT) 100 MG tablet Take 100 mg by mouth daily.   Yes [provider]    Physical Exam: Vitals:   10/11/22 1315 10/11/22 1415 10/11/22 1426 10/11/22 1816  BP: 122/66 115/69    Pulse: 82 (!) 108    Resp: 15 16    Temp:   99.6 F (37.6 C) 99.4 F (37.4 C)  TempSrc:   Oral Oral  SpO2: 98% (!) 83%    Weight:      Height:          Constitutional: Middle-age male, moderately built and frail, somewhat disheveled, lying comfortably propped up in bed without distress. Eyes: PERTLA, lids and conjunctivae normal.  Bilateral immature cataracts and arcus senilis present. Head: Did not notice any bleeding or overt bumps on scalp palpation.  Limited by hair. ENMT: Mucous membranes are somewhat dry. Posterior pharynx clear of any exudate or lesions. Normal dentition.  Neck: supple, no masses, no thyromegaly Respiratory: Clear to auscultation without wheezing, rhonchi or crackles. No increased work of breathing. Cardiovascular: S1 & S2 heard, regular rate and rhythm. No JVD, murmurs, rubs or clicks. No pedal edema.  Telemetry personally reviewed at bedside: Sinus rhythm. Abdomen: Non distended. Non tender. Soft. No organomegaly or masses appreciated. No clinical Ascites. Normal bowel sounds heard.  Right lower quadrant horizontal 2 surgical scars, possibly from appendectomy but unclear what the second scar is from (spouse feels like she has had some history of prostate problems). Musculoskeletal: Symmetric movements of all extremities except right lower extremity which is shortened and externally rotated.  Neurovascular bundle intact in the  right lower extremity with good dorsalis pedis and posterior tibial pulsations.  Extensive small varicosities seen in the right ankle and foot. Skin: no rashes, lesions, ulcers. No induration Neurologic: CN 2-12 grossly intact. Sensation intact, DTR normal. Strength 5/5 in all 4 limbs except right lower extremity as detailed above.  Tiny superficial abrasion on the ventral aspect of the right wrist.  No other overt injuries noted. Psychiatric: Impaired judgment and insight. Alert and oriented x only to self and states that he is in [place]. Normal mood.     Labs on Admission: I have personally reviewed following labs and imaging studies  CBC: Recent Labs  Lab 10/11/22 1030  WBC 13.4*  HGB 14.0  HCT 41.3  MCV 98.3  PLT 178    Basic Metabolic Panel: Recent Labs  Lab 10/11/22 1030  NA 134*  K 3.4*  CL 102  CO2 22  GLUCOSE 126*  BUN 11  CREATININE 0.81  CALCIUM 8.8*    Liver Function Tests: No results for input(s): "AST", "ALT", "ALKPHOS", "BILITOT", "PROT", "ALBUMIN" in the last 168 hours.  Urine analysis:    Component Value Date/Time   BILIRUBINUR Negative 07/05/2017 1140   PROTEINUR Negative 07/05/2017 1140   UROBILINOGEN 0.2 07/05/2017 1140   NITRITE Negative 07/05/2017 1140   LEUKOCYTESUR Negative 07/05/2017 1140     Radiological Exams on Admission: CT Hip Right Wo Contrast  Result Date: 10/11/2022 CLINICAL DATA:  Hip trauma.  Fracture suspected.  Fall. EXAM: CT OF THE RIGHT HIP WITHOUT CONTRAST TECHNIQUE: Multidetector CT imaging of the right hip was performed according to the standard protocol. Multiplanar CT image reconstructions were also generated. RADIATION DOSE REDUCTION: This exam was performed according to the departmental dose-optimization program which includes automated exposure control, adjustment of the mA and/or kV according to patient size and/or use of iterative reconstruction technique. COMPARISON:  Pelvis and right hip radiographs 10/11/2022  FINDINGS: Bones/Joint/Cartilage There is an oblique fracture extending from the anterior inferior aspect of the greater trochanter through the anterior aspect of the lesser trochanter (coronal series 4 images 27 through 30). There is up to 9 mm transverse dimension diastasis within the anterior superior aspect of this fracture but no significant diastasis within the inferior aspect of this fracture. There is an additional nondisplaced oblique fracture within the lesser tuberosity (axial series 2, images 47-51). Acute fracture of the far posterior aspect of the femoral neck-greater trochanter junction (axial series 3, image 123 and coronal series 4, image 34). There is mild anterior apex angulation of the dominant anterior intertrochanteric and posterior distal femoral neck fractures. Mild-to-moderate right greater than left sacroiliac joint space narrowing, subchondral sclerosis, and anterior peripheral osteophytosis. The pubic symphysis joint space is maintained. Ligaments Suboptimally assessed by CT. Muscles and Tendons There is mildly decreased definition within the just psoas musculotendinous junction which may be secondary to mild posttraumatic interstitial muscle tearing and/or blood products (axial series 2 images 45 through 48). No gross tendon tear is visualized. Soft tissues Moderate partially visualized sigmoid diverticulosis. IMPRESSION: 1. Acute oblique fracture extending from the anterior inferior aspect of the greater trochanter through the anterior aspect of the lesser trochanter. There is up to 9 mm transverse dimension diastasis within the anterior superior aspect of this fracture but no significant diastasis within the inferior aspect of this fracture. 2. Additional nondisplaced acute oblique fracture within the lesser tuberosity. 3. Acute fracture of the far posterior aspect of the distal femoral neck at the junction with the greater trochanter. There is mild anterior apex angulation of the  dominant anterior intertrochanteric and posterior distal femoral neck fractures. Electronically Signed   By: Neita Garnet M.D.   On: 10/11/2022 16:40   CT Head Wo Contrast  Result Date: 10/11/2022 CLINICAL DATA:  67 year old male with head injury. EXAM: CT HEAD WITHOUT CONTRAST TECHNIQUE: Contiguous axial images were obtained from the base of the skull through the vertex without intravenous contrast. RADIATION DOSE REDUCTION: This exam was performed according to the departmental dose-optimization program which includes automated exposure control, adjustment of the mA and/or kV according to patient size and/or use of iterative reconstruction technique. COMPARISON:  None Available. FINDINGS: Brain: A 3 mm hyperdensity within the occipital horn of the LEFT LATERAL ventricle is noted compatible with a tiny amount of intraventricular hemorrhage. No other hemorrhage identified. There is no evidence of mass lesion or mass effect, hydrocephalus, extra-axial collection, acute infarct or midline  shift. Atrophy and probable mild chronic small vessel white matter ischemic changes are noted. Vascular: Carotid atherosclerotic calcifications are noted. Skull: Normal. Negative for fracture or focal lesion. Sinuses/Orbits: No acute finding. Other: None. IMPRESSION: 1. Tiny amount of intraventricular hemorrhage within LEFT occipital horn. No other acute abnormality identified. 2. Atrophy and probable mild chronic small vessel white matter ischemic changes. Critical Value/emergent results were called by telephone at the time of interpretation on 10/11/2022 at 1:27 pm to provider [facility] , who verbally acknowledged these results. Electronically Signed   By: Harmon Pier M.D.   On: 10/11/2022 13:27   CT Cervical Spine Wo Contrast  Result Date: 10/11/2022 CLINICAL DATA:  Neck trauma (Age >= 65y) EXAM: CT CERVICAL SPINE WITHOUT CONTRAST TECHNIQUE: Multidetector CT imaging of the cervical spine was performed without intravenous  contrast. Multiplanar CT image reconstructions were also generated. RADIATION DOSE REDUCTION: This exam was performed according to the departmental dose-optimization program which includes automated exposure control, adjustment of the mA and/or kV according to patient size and/or use of iterative reconstruction technique. COMPARISON:  None Available. FINDINGS: Alignment: Mild straightening of the normal cervical lordosis. Skull base and vertebrae: No acute fracture. No primary bone lesion or focal pathologic process. Soft tissues and spinal canal: No prevertebral fluid or swelling. No visible canal hematoma. Disc levels:  No evidence of high-grade spinal canal stenosis Upper chest: Negative. Other: None. IMPRESSION: No acute fracture or traumatic subluxation of the cervical spine. Electronically Signed   By: Lorenza Cambridge M.D.   On: 10/11/2022 11:17   DG Hip Unilat W or Wo Pelvis 2-3 Views Right  Result Date: 10/11/2022 CLINICAL DATA:  Pain after fall EXAM: DG HIP (WITH OR WITHOUT PELVIS) 3V RIGHT COMPARISON:  None Available. FINDINGS: Oblique lucency along the intertrochanteric region of the right hip. Nondisplaced fracture is possible. Recommend further characterization with CT. Otherwise no additional fracture or dislocation. Preserved joint spaces. There is global osteopenia. Well rounded densities in the seen in the pelvis consistent with phleboliths. There also other vascular calcifications noted. IMPRESSION: Oblique lucency along the intertrochanteric region of the right hip. Nondisplaced fracture is possible. Recommend further characterization with CT. Electronically Signed   By: Karen Kays M.D.   On: 10/11/2022 11:03    EKG: Independently reviewed.  Sinus rhythm, normal axis, RSR pattern in V1-2, no acute changes and QTc 430 ms.  Assessment/Plan Principal Problem:   Closed right hip fracture (HCC) Active Problems:   Alcoholism in remission (HCC)   Hypertension   Intraventricular hemorrhage  (HCC)   Dementia with behavioral disturbance (HCC)   Hypokalemia   Tobacco use disorder     Closed acute right hip fracture: - Sustained s/p mechanical fall at home on 6/3. -DG hip right: Oblique lucency along the intertrochanteric region of the right hip.  Nondisplaced fracture is possible. - CT of the right hip: Appears to have a complex right hip fracture as an report above. -Orthopedics/Dr. Yevette Edwards consulted by EDP and will need surgical fixation - Regular diet now.  N.p.o. after midnight for possible surgery tomorrow -As per hip fracture order set, consulted anesthesia for right femoral block. - Multimodality pain control, initiated scheduled Tylenol 1 g 3 times daily.  Minimize opioids as much as possible. - No reported cardiac, respiratory history and patient is optimized to proceed with indicated surgery with anticipated risks. -Check vitamin D levels.  Intraventricular hemorrhage: - CT head 10/11/2022: Tiny amount of intraventricular hemorrhage within the left occipital horn.  No other acute  abnormality identified.  Likely related to fall and head trauma. Alert and no focal deficits. - As per EDP discussion with Dr. Bradly Bienenstock, recommends repeating head CT in 12 to 24 hours -Avoiding chemical DVT prophylaxis. - If mental status deteriorates, may need to repeat head CT stat.  S/p mechanical fall 10/11/2022: - No syncope as per spouse's report - Will need PT and OT evaluation postsurgery and likely SNF for STR  Hypokalemia: - Replace and follow.  Check magnesium in AM.  Advanced dementia with behavioral changes: - Per spouse, etiology of dementia is unclear. - High risk for hospital delirium.  Delirium precautions - Continue prior home dose of Aricept, Namenda, buspirone, Zoloft and Seroquel. - If has agitation, may need safety sitter and as needed Haldol or Seroquel or Zyprexa.  Tobacco abuse: - Prior history of significant cigarette smoking but lately infrequent  use of cigars.  Hypertension: - Spouse denies history of same and patient not on any prescription meds.  BP controlled. - Monitor.  Alcohol use disorder in remission: - History as noted above.  Monitor.   DVT prophylaxis: SCDs Code Status: DNR.  This was confirmed with the patient's spouse/HCPOA at bedside in the presence of patient's ED RN.  Spouse will bring appropriate documentation for hospital records. Family Communication: Spouse was present at bedside through the course of patient's evaluation.  Updated care and answered all questions. Disposition Plan:   Patient is from:  Home  Anticipated DC to:  SNF for STR  Anticipated DC date:  None  Anticipated DC barriers: None   Consults called: EDP has consulted Dr. Rivka Safer and Dr. Cram/Neurosurgery Admission status: Inpatient/telemetry  Severity of Illness: The appropriate patient status for this patient is INPATIENT. Inpatient status is judged to be reasonable and necessary in order to provide the required intensity of service to ensure the patient's safety. The patient's presenting symptoms, physical exam findings, and initial radiographic and laboratory data in the context of their chronic comorbidities is felt to place them at high risk for further clinical deterioration. Furthermore, it is not anticipated that the patient will be medically stable for discharge from the hospital within 2 midnights of admission.   * I certify that at the point of admission it is my clinical judgment that the patient will require inpatient hospital care spanning beyond 2 midnights from the point of admission due to high intensity of service, high risk for further deterioration and high frequency of surveillance required.*    Marcellus Scott MD FACP, FHM, SFHM, [facility], [facility]   [facility] [place]    To contact the attending provider between 7A-7P or the covering provider during after hours 7P-7A,  please log into the web site [email or web address] and access using universal [place] password for that web site. If you do not have the password, please call the hospital operator.  10/11/2022, 6:28 PM

## 2022-10-11 NOTE — ED Notes (Addendum)
ED TO INPATIENT HANDOFF REPORT  ED Nurse Name and Phone #: Brett Canales [phone]  S Name/Age/Gender Tanner Bond 67 y.o. male Room/Bed: 004C/004C  Code Status   Code Status: Not on file  Home/SNF/Other Home Patient oriented to: self and place Is this baseline? Yes   Triage Complete: Triage complete  Chief Complaint Closed right hip fracture (HCC) [S72.001A]  Triage Note PT BIB EMS from home after an unwitnessed fall onto right side at 0630 without LOC. Pt's wife had help form neighbor to get pt off floor and onto couch. Pt has posterior head hematoma. At 0800 pt was unable to get off of couch d/t pain. HX dementia.   EMS VS 129/75 81 HR 97% 14   Allergies No Known Allergies  Level of Care/Admitting Diagnosis ED Disposition     ED Disposition  Admit   Condition  --   Comment  Hospital Area: [facility] [[id number]]  Level of Care: Telemetry Medical [104]  May admit patient to [facility] or [facility] if equivalent level of care is available:: No  Covid Evaluation: Asymptomatic - no recent exposure (last 10 days) testing not required  Diagnosis: Closed right hip fracture [facility]) [[id number]]  Admitting Physician: Elease Etienne [[id number]]  Attending Physician: Marcellus Scott D [[id number]]  Certification:: I certify this patient will need inpatient services for at least 2 midnights  Estimated Length of Stay: 4          B Medical/Surgery History Past Medical History:  Diagnosis Date   Alcohol abuse    Liver dysfunction    Past Surgical History:  Procedure Laterality Date   APPENDECTOMY     as child   URETHRAL DILATION     as child     A IV Location/Drains/Wounds Patient Lines/Drains/Airways Status     Active Line/Drains/Airways     Name Placement date Placement time Site Days   Peripheral IV 10/11/22 20 G Anterior;Right Forearm 10/11/22  1805  Forearm  less than 1            Intake/Output Last 24 hours No intake or output data in the  24 hours ending 10/11/22 1805  Labs/Imaging Results for orders placed or performed during the hospital encounter of 10/11/22 (from the past 48 hour(s))  CBC     Status: Abnormal   Collection Time: 10/11/22 10:30 AM  Result Value Ref Range   WBC 13.4 (H) 4.0 - 10.5 K/uL   RBC 4.20 (L) 4.22 - 5.81 MIL/uL   Hemoglobin 14.0 13.0 - 17.0 g/dL   HCT 62.1 30.8 - 65.7 %   MCV 98.3 80.0 - 100.0 fL   MCH 33.3 26.0 - 34.0 pg   MCHC 33.9 30.0 - 36.0 g/dL   RDW 84.6 96.2 - 95.2 %   Platelets 178 150 - 400 K/uL   nRBC 0.0 0.0 - 0.2 %    Comment: Performed at [facility], [address] [place], [place] [address]  Basic metabolic panel     Status: Abnormal   Collection Time: 10/11/22 10:30 AM  Result Value Ref Range   Sodium 134 (L) 135 - 145 mmol/L   Potassium 3.4 (L) 3.5 - 5.1 mmol/L   Chloride 102 98 - 111 mmol/L   CO2 22 22 - 32 mmol/L   Glucose, Bld 126 (H) 70 - 99 mg/dL    Comment: Glucose reference range applies only to samples taken after fasting for at least 8 hours.   BUN 11 8 -  23 mg/dL   Creatinine, Ser 1.61 0.61 - 1.24 mg/dL   Calcium 8.8 (L) 8.9 - 10.3 mg/dL   GFR, Estimated >09 >60 mL/min    Comment: (NOTE) Calculated using the CKD-EPI Creatinine Equation (2021)    Anion gap 10 5 - 15    Comment: Performed at [facility], [address] [place], [place] [address]   CT Hip Right Wo Contrast  Result Date: 10/11/2022 CLINICAL DATA:  Hip trauma.  Fracture suspected.  Fall. EXAM: CT OF THE RIGHT HIP WITHOUT CONTRAST TECHNIQUE: Multidetector CT imaging of the right hip was performed according to the standard protocol. Multiplanar CT image reconstructions were also generated. RADIATION DOSE REDUCTION: This exam was performed according to the departmental dose-optimization program which includes automated exposure control, adjustment of the mA and/or kV according to patient size and/or use of iterative reconstruction technique. COMPARISON:  Pelvis and right hip  radiographs 10/11/2022 FINDINGS: Bones/Joint/Cartilage There is an oblique fracture extending from the anterior inferior aspect of the greater trochanter through the anterior aspect of the lesser trochanter (coronal series 4 images 27 through 30). There is up to 9 mm transverse dimension diastasis within the anterior superior aspect of this fracture but no significant diastasis within the inferior aspect of this fracture. There is an additional nondisplaced oblique fracture within the lesser tuberosity (axial series 2, images 47-51). Acute fracture of the far posterior aspect of the femoral neck-greater trochanter junction (axial series 3, image 123 and coronal series 4, image 34). There is mild anterior apex angulation of the dominant anterior intertrochanteric and posterior distal femoral neck fractures. Mild-to-moderate right greater than left sacroiliac joint space narrowing, subchondral sclerosis, and anterior peripheral osteophytosis. The pubic symphysis joint space is maintained. Ligaments Suboptimally assessed by CT. Muscles and Tendons There is mildly decreased definition within the just psoas musculotendinous junction which may be secondary to mild posttraumatic interstitial muscle tearing and/or blood products (axial series 2 images 45 through 48). No gross tendon tear is visualized. Soft tissues Moderate partially visualized sigmoid diverticulosis. IMPRESSION: 1. Acute oblique fracture extending from the anterior inferior aspect of the greater trochanter through the anterior aspect of the lesser trochanter. There is up to 9 mm transverse dimension diastasis within the anterior superior aspect of this fracture but no significant diastasis within the inferior aspect of this fracture. 2. Additional nondisplaced acute oblique fracture within the lesser tuberosity. 3. Acute fracture of the far posterior aspect of the distal femoral neck at the junction with the greater trochanter. There is mild anterior apex  angulation of the dominant anterior intertrochanteric and posterior distal femoral neck fractures. Electronically Signed   By: Neita Garnet M.D.   On: 10/11/2022 16:40   CT Head Wo Contrast  Result Date: 10/11/2022 CLINICAL DATA:  67 year old male with head injury. EXAM: CT HEAD WITHOUT CONTRAST TECHNIQUE: Contiguous axial images were obtained from the base of the skull through the vertex without intravenous contrast. RADIATION DOSE REDUCTION: This exam was performed according to the departmental dose-optimization program which includes automated exposure control, adjustment of the mA and/or kV according to patient size and/or use of iterative reconstruction technique. COMPARISON:  None Available. FINDINGS: Brain: A 3 mm hyperdensity within the occipital horn of the LEFT LATERAL ventricle is noted compatible with a tiny amount of intraventricular hemorrhage. No other hemorrhage identified. There is no evidence of mass lesion or mass effect, hydrocephalus, extra-axial collection, acute infarct or midline shift. Atrophy and probable mild chronic small vessel white matter ischemic changes  are noted. Vascular: Carotid atherosclerotic calcifications are noted. Skull: Normal. Negative for fracture or focal lesion. Sinuses/Orbits: No acute finding. Other: None. IMPRESSION: 1. Tiny amount of intraventricular hemorrhage within LEFT occipital horn. No other acute abnormality identified. 2. Atrophy and probable mild chronic small vessel white matter ischemic changes. Critical Value/emergent results were called by telephone at the time of interpretation on 10/11/2022 at 1:27 pm to provider [facility] R[facility] , who verbally acknowledged these results. Electronically Signed   By: Harmon Pier M.D.   On: 10/11/2022 13:27   CT Cervical Spine Wo Contrast  Result Date: 10/11/2022 CLINICAL DATA:  Neck trauma (Age >= 65y) EXAM: CT CERVICAL SPINE WITHOUT CONTRAST TECHNIQUE: Multidetector CT imaging of the cervical spine was performed  without intravenous contrast. Multiplanar CT image reconstructions were also generated. RADIATION DOSE REDUCTION: This exam was performed according to the departmental dose-optimization program which includes automated exposure control, adjustment of the mA and/or kV according to patient size and/or use of iterative reconstruction technique. COMPARISON:  None Available. FINDINGS: Alignment: Mild straightening of the normal cervical lordosis. Skull base and vertebrae: No acute fracture. No primary bone lesion or focal pathologic process. Soft tissues and spinal canal: No prevertebral fluid or swelling. No visible canal hematoma. Disc levels:  No evidence of high-grade spinal canal stenosis Upper chest: Negative. Other: None. IMPRESSION: No acute fracture or traumatic subluxation of the cervical spine. Electronically Signed   By: Lorenza Cambridge M.D.   On: 10/11/2022 11:17   DG Hip Unilat W or Wo Pelvis 2-3 Views Right  Result Date: 10/11/2022 CLINICAL DATA:  Pain after fall EXAM: DG HIP (WITH OR WITHOUT PELVIS) 3V RIGHT COMPARISON:  None Available. FINDINGS: Oblique lucency along the intertrochanteric region of the right hip. Nondisplaced fracture is possible. Recommend further characterization with CT. Otherwise no additional fracture or dislocation. Preserved joint spaces. There is global osteopenia. Well rounded densities in the seen in the pelvis consistent with phleboliths. There also other vascular calcifications noted. IMPRESSION: Oblique lucency along the intertrochanteric region of the right hip. Nondisplaced fracture is possible. Recommend further characterization with CT. Electronically Signed   By: Karen Kays M.D.   On: 10/11/2022 11:03    Pending Labs Unresulted Labs (From admission, onward)    None       Vitals/Pain Today's Vitals   10/11/22 1200 10/11/22 1315 10/11/22 1415 10/11/22 1426  BP: 127/78 122/66 115/69   Pulse: 78 82 (!) 108   Resp: 16 15 16    Temp:    99.6 F (37.6 C)   TempSrc:    Oral  SpO2: 98% 98% (!) 83%   Weight:      Height:      PainSc:        Isolation Precautions No active isolations  Medications Medications - No data to display  Mobility non-ambulatory   Due to fracture  Focused Assessments Musculoskeletal -  Right hip fracture   R Recommendations: See Admitting Provider Note  Report given to:   Additional Notes:

## 2022-10-11 NOTE — Progress Notes (Signed)
Reviewed consult for IV. Noted new IV documented by RN after consult time. Consult cleared. VAST available for any further needs.

## 2022-10-11 NOTE — Anesthesia Procedure Notes (Signed)
Anesthesia Regional Block: Femoral nerve block   Pre-Anesthetic Checklist: , timeout performed,  Correct Patient, Correct Site, Correct Laterality,  Correct Procedure, Correct Position, site marked,  Risks and benefits discussed,  Surgical consent,  Pre-op evaluation,  At surgeon's request and post-op pain management  Laterality: Right  Prep: Dura Prep       Needles:  Injection technique: Single-shot  Needle Type: Echogenic Stimulator Needle     Needle Length: 10cm  Needle Gauge: 20     Additional Needles:   Procedures:,,,, ultrasound used (permanent image in chart),,    Narrative:  Start time: 10/11/2022 9:45 PM End time: 10/11/2022 9:50 PM Injection made incrementally with aspirations every 5 mL.  Performed by: Personally  Anesthesiologist: Atilano Median, DO  Additional Notes: Patient identified. Risks/Benefits/Options discussed with patient including but not limited to bleeding, infection, nerve damage, failed block, incomplete pain control. Patient expressed understanding and wished to proceed. All questions were answered. Sterile technique was used throughout the entire procedure. Please see nursing notes for vital signs. Aspirated in 5cc intervals with injection for negative confirmation. Patient was given instructions on fall risk and not to get out of bed. All questions and concerns addressed with instructions to call with any issues or inadequate analgesia.

## 2022-10-11 NOTE — Anesthesia Preprocedure Evaluation (Signed)
Anesthesia Evaluation  Patient identified by MRN, date of birth, ID band Patient confused    Airway Mallampati: II  TM Distance: >3 FB Neck ROM: Full    Dental no notable dental hx.    Pulmonary Current Smoker   Pulmonary exam normal        Cardiovascular hypertension,  Rhythm:Regular Rate:Normal     Neuro/Psych       Dementia negative neurological ROS     GI/Hepatic negative GI ROS, Neg liver ROS,,,  Endo/Other  negative endocrine ROS    Renal/GU negative Renal ROS  negative genitourinary   Musculoskeletal Hip fracture 2/2 fall   Abdominal Normal abdominal exam  (+)   Peds  Hematology Lab Results      Component                Value               Date                      WBC                      13.4 (H)            10/11/2022                HGB                      14.0                10/11/2022                HCT                      41.3                10/11/2022                MCV                      98.3                10/11/2022                PLT                      178                 10/11/2022             Lab Results      Component                Value               Date                      NA                       134 (L)             10/11/2022                K                        3.4 (L)             10/11/2022  CO2                      22                  10/11/2022                GLUCOSE                  126 (H)             10/11/2022                BUN                      11                  10/11/2022                CREATININE               0.81                10/11/2022                CALCIUM                  8.8 (L)             10/11/2022                GFRNONAA                 >60                 10/11/2022              Anesthesia Other Findings   Reproductive/Obstetrics                             Anesthesia Physical Anesthesia Plan  ASA:  3  Anesthesia Plan:    Post-op Pain Management: Regional block*   Induction:   PONV Risk Score and Plan:   Airway Management Planned: Natural Airway  Additional Equipment: None  Intra-op Plan:   Post-operative Plan:   Informed Consent: I have reviewed the patients History and Physical, chart, labs and discussed the procedure including the risks, benefits and alternatives for the proposed anesthesia with the patient or authorized representative who has indicated his/her understanding and acceptance.     Consent reviewed with POA  Plan Discussed with:   Anesthesia Plan Comments:        Anesthesia Quick Evaluation

## 2022-10-11 NOTE — Progress Notes (Signed)
I was contacted regarding patient's right intertrochanteric fracture.  Patient's imaging studies were reviewed. Per protocol, patient will be admitted to medicine and evaluated in the morning.  I would kindly ask the medical team to ensure patient is made NPO tonight for potential surgery tomorrow. Patient may have surgery by Dr. Luiz Blare tomorrow, depending on OR availability.

## 2022-10-12 ENCOUNTER — Inpatient Hospital Stay (HOSPITAL_COMMUNITY): Payer: No Typology Code available for payment source | Admitting: Anesthesiology

## 2022-10-12 ENCOUNTER — Encounter (HOSPITAL_COMMUNITY)
Admission: EM | Disposition: A | Discharge status: Skilled Nursing Facility | Payer: Self-pay | Source: Home / Self Care | Attending: Family Medicine

## 2022-10-12 ENCOUNTER — Encounter (HOSPITAL_COMMUNITY): Payer: Self-pay | Admitting: Internal Medicine

## 2022-10-12 ENCOUNTER — Inpatient Hospital Stay (HOSPITAL_COMMUNITY): Payer: No Typology Code available for payment source

## 2022-10-12 DIAGNOSIS — E559 Vitamin D deficiency, unspecified: Secondary | ICD-10-CM

## 2022-10-12 DIAGNOSIS — F0390 Unspecified dementia without behavioral disturbance: Secondary | ICD-10-CM

## 2022-10-12 DIAGNOSIS — S72141A Displaced intertrochanteric fracture of right femur, initial encounter for closed fracture: Secondary | ICD-10-CM | POA: Diagnosis not present

## 2022-10-12 DIAGNOSIS — I10 Essential (primary) hypertension: Secondary | ICD-10-CM

## 2022-10-12 DIAGNOSIS — S72001D Fracture of unspecified part of neck of right femur, subsequent encounter for closed fracture with routine healing: Secondary | ICD-10-CM | POA: Diagnosis not present

## 2022-10-12 DIAGNOSIS — F17210 Nicotine dependence, cigarettes, uncomplicated: Secondary | ICD-10-CM

## 2022-10-12 DIAGNOSIS — I615 Nontraumatic intracerebral hemorrhage, intraventricular: Secondary | ICD-10-CM | POA: Diagnosis not present

## 2022-10-12 DIAGNOSIS — E876 Hypokalemia: Secondary | ICD-10-CM | POA: Diagnosis not present

## 2022-10-12 HISTORY — PX: INTRAMEDULLARY (IM) NAIL INTERTROCHANTERIC: SHX5875

## 2022-10-12 LAB — VITAMIN D 25 HYDROXY (VIT D DEFICIENCY, FRACTURES): Vit D, 25-Hydroxy: 15.54 ng/mL — ABNORMAL LOW (ref 30–100)

## 2022-10-12 LAB — MAGNESIUM: Magnesium: 2.1 mg/dL (ref 1.7–2.4)

## 2022-10-12 LAB — BASIC METABOLIC PANEL
Anion gap: 9 (ref 5–15)
BUN: 12 mg/dL (ref 8–23)
CO2: 23 mmol/L (ref 22–32)
Calcium: 8.5 mg/dL — ABNORMAL LOW (ref 8.9–10.3)
Chloride: 102 mmol/L (ref 98–111)
Creatinine, Ser: 0.71 mg/dL (ref 0.61–1.24)
GFR, Estimated: >60 mL/min (ref >60)
Glucose, Bld: 111 mg/dL — ABNORMAL HIGH (ref 70–99)
Potassium: 3.4 mmol/L — ABNORMAL LOW (ref 3.5–5.1)
Sodium: 134 mmol/L — ABNORMAL LOW (ref 135–145)

## 2022-10-12 LAB — SURGICAL PCR SCREEN
MRSA, PCR: NEGATIVE
Staphylococcus aureus: POSITIVE — AB

## 2022-10-12 LAB — CBC
HCT: 38.2 % — ABNORMAL LOW (ref 39.0–52.0)
Hemoglobin: 13.5 g/dL (ref 13.0–17.0)
MCH: 33.2 pg (ref 26.0–34.0)
MCHC: 35.3 g/dL (ref 30.0–36.0)
MCV: 93.9 fL (ref 80.0–100.0)
Platelets: 174 10*3/uL (ref 150–400)
RBC: 4.07 MIL/uL — ABNORMAL LOW (ref 4.22–5.81)
RDW: 12.3 % (ref 11.5–15.5)
WBC: 8.3 10*3/uL (ref 4.0–10.5)
nRBC: 0 % (ref 0.0–0.2)

## 2022-10-12 LAB — GLUCOSE, CAPILLARY: Glucose-Capillary: 118 mg/dL — ABNORMAL HIGH (ref 70–99)

## 2022-10-12 SURGERY — FIXATION, FRACTURE, INTERTROCHANTERIC, WITH INTRAMEDULLARY ROD
Anesthesia: Spinal | Site: Hip | Laterality: Right

## 2022-10-12 MED ORDER — DOCUSATE SODIUM 100 MG PO CAPS
100.0000 mg | ORAL_CAPSULE | Freq: Two times a day (BID) | ORAL | Status: DC
  Administered 2022-10-12 – 2022-10-16 (×8): 100 mg via ORAL
  Filled 2022-10-12 (×8): qty 1

## 2022-10-12 MED ORDER — MEMANTINE HCL 10 MG PO TABS
5.0000 mg | ORAL_TABLET | Freq: Two times a day (BID) | ORAL | Status: DC
  Administered 2022-10-12 – 2022-10-16 (×10): 5 mg via ORAL
  Filled 2022-10-12 (×10): qty 1

## 2022-10-12 MED ORDER — LACTATED RINGERS IV SOLN: INTRAVENOUS | Status: DC

## 2022-10-12 MED ORDER — ASPIRIN 325 MG PO TBEC
325.0000 mg | DELAYED_RELEASE_TABLET | Freq: Every day | ORAL | Status: DC
  Administered 2022-10-13 – 2022-10-16 (×4): 325 mg via ORAL
  Filled 2022-10-12 (×4): qty 1

## 2022-10-12 MED ORDER — POTASSIUM CHLORIDE 10 MEQ/100ML IV SOLN
10.0000 meq | INTRAVENOUS | Status: AC
  Administered 2022-10-12 (×2): 10 meq via INTRAVENOUS
  Filled 2022-10-12 (×3): qty 100

## 2022-10-12 MED ORDER — ACETAMINOPHEN 500 MG PO TABS: 1000.0000 mg | ORAL_TABLET | Freq: Once | ORAL | Status: DC

## 2022-10-12 MED ORDER — POVIDONE-IODINE 10 % EX SWAB
2.0000 | Freq: Once | CUTANEOUS | Status: AC
  Administered 2022-10-12: 2 via TOPICAL

## 2022-10-12 MED ORDER — ACETAMINOPHEN 500 MG PO TABS
500.0000 mg | ORAL_TABLET | Freq: Four times a day (QID) | ORAL | Status: AC
  Administered 2022-10-12 – 2022-10-13 (×2): 500 mg via ORAL
  Filled 2022-10-12 (×2): qty 1

## 2022-10-12 MED ORDER — SERTRALINE HCL 100 MG PO TABS
100.0000 mg | ORAL_TABLET | Freq: Every day | ORAL | Status: DC
  Administered 2022-10-12 – 2022-10-16 (×5): 100 mg via ORAL
  Filled 2022-10-12 (×5): qty 1

## 2022-10-12 MED ORDER — CHLORHEXIDINE GLUCONATE CLOTH 2 % EX PADS
6.0000 | MEDICATED_PAD | Freq: Every day | CUTANEOUS | Status: AC
  Administered 2022-10-12 – 2022-10-16 (×5): 6 via TOPICAL

## 2022-10-12 MED ORDER — QUETIAPINE FUMARATE 25 MG PO TABS
25.0000 mg | ORAL_TABLET | Freq: Every day | ORAL | Status: DC
  Administered 2022-10-12 – 2022-10-15 (×5): 25 mg via ORAL
  Filled 2022-10-12 (×5): qty 1

## 2022-10-12 MED ORDER — FENTANYL CITRATE (PF) 100 MCG/2ML IJ SOLN: 25.0000 ug | INTRAMUSCULAR | Status: DC | PRN

## 2022-10-12 MED ORDER — BISACODYL 5 MG PO TBEC: 5.0000 mg | DELAYED_RELEASE_TABLET | Freq: Every day | ORAL | Status: DC | PRN

## 2022-10-12 MED ORDER — PROPOFOL 10 MG/ML IV BOLUS
INTRAVENOUS | Status: DC | PRN
  Administered 2022-10-12: 70 mg via INTRAVENOUS

## 2022-10-12 MED ORDER — PHENYLEPHRINE 80 MCG/ML (10ML) SYRINGE FOR IV PUSH (FOR BLOOD PRESSURE SUPPORT)
PREFILLED_SYRINGE | INTRAVENOUS | Status: DC | PRN
  Administered 2022-10-12: 160 ug via INTRAVENOUS

## 2022-10-12 MED ORDER — TRANEXAMIC ACID-NACL 1000-0.7 MG/100ML-% IV SOLN
1000.0000 mg | Freq: Once | INTRAVENOUS | Status: AC
  Administered 2022-10-12: 1000 mg via INTRAVENOUS
  Filled 2022-10-12: qty 100

## 2022-10-12 MED ORDER — METHOCARBAMOL 1000 MG/10ML IJ SOLN: 500.0000 mg | Freq: Four times a day (QID) | INTRAVENOUS | Status: DC | PRN

## 2022-10-12 MED ORDER — ACETAMINOPHEN 500 MG PO TABS
1000.0000 mg | ORAL_TABLET | Freq: Three times a day (TID) | ORAL | Status: DC
  Administered 2022-10-14 – 2022-10-16 (×7): 1000 mg via ORAL
  Filled 2022-10-12 (×7): qty 2

## 2022-10-12 MED ORDER — VITAMIN D 25 MCG (1000 UNIT) PO TABS
1000.0000 [IU] | ORAL_TABLET | Freq: Every day | ORAL | Status: DC
  Administered 2022-10-13 – 2022-10-16 (×4): 1000 [IU] via ORAL
  Filled 2022-10-12 (×4): qty 1

## 2022-10-12 MED ORDER — DOCUSATE SODIUM 100 MG PO CAPS
100.0000 mg | ORAL_CAPSULE | Freq: Two times a day (BID) | ORAL | Status: DC
  Administered 2022-10-12 (×2): 100 mg via ORAL
  Filled 2022-10-12 (×2): qty 1

## 2022-10-12 MED ORDER — ACETAMINOPHEN 325 MG PO TABS: 325.0000 mg | ORAL_TABLET | Freq: Four times a day (QID) | ORAL | Status: DC | PRN

## 2022-10-12 MED ORDER — CHLORHEXIDINE GLUCONATE 0.12 % MT SOLN: 15.0000 mL | Freq: Once | OROMUCOSAL | Status: DC

## 2022-10-12 MED ORDER — PROPOFOL 10 MG/ML IV BOLUS
INTRAVENOUS | Status: AC
  Filled 2022-10-12: qty 20

## 2022-10-12 MED ORDER — BUSPIRONE HCL 10 MG PO TABS
10.0000 mg | ORAL_TABLET | Freq: Three times a day (TID) | ORAL | Status: DC
  Administered 2022-10-12 – 2022-10-16 (×14): 10 mg via ORAL
  Filled 2022-10-12 (×14): qty 1

## 2022-10-12 MED ORDER — CHLORHEXIDINE GLUCONATE 4 % EX SOLN: 60.0000 mL | Freq: Once | CUTANEOUS | Status: DC

## 2022-10-12 MED ORDER — CHLORHEXIDINE GLUCONATE 0.12 % MT SOLN
OROMUCOSAL | Status: AC
  Administered 2022-10-12: 15 mL
  Filled 2022-10-12: qty 15

## 2022-10-12 MED ORDER — ONDANSETRON HCL 4 MG/2ML IJ SOLN: 4.0000 mg | Freq: Four times a day (QID) | INTRAMUSCULAR | Status: DC | PRN

## 2022-10-12 MED ORDER — HYDROCODONE-ACETAMINOPHEN 5-325 MG PO TABS: 1.0000 | ORAL_TABLET | Freq: Four times a day (QID) | ORAL | 0 refills | Status: DC | PRN

## 2022-10-12 MED ORDER — 0.9 % SODIUM CHLORIDE (POUR BTL) OPTIME
TOPICAL | Status: DC | PRN
  Administered 2022-10-12: 1000 mL

## 2022-10-12 MED ORDER — CEFAZOLIN SODIUM-DEXTROSE 2-4 GM/100ML-% IV SOLN
2.0000 g | Freq: Four times a day (QID) | INTRAVENOUS | Status: AC
  Administered 2022-10-12 – 2022-10-13 (×2): 2 g via INTRAVENOUS
  Filled 2022-10-12 (×2): qty 100

## 2022-10-12 MED ORDER — POLYETHYLENE GLYCOL 3350 17 G PO PACK: 17.0000 g | PACK | Freq: Every day | ORAL | Status: DC | PRN

## 2022-10-12 MED ORDER — CEFAZOLIN SODIUM-DEXTROSE 2-4 GM/100ML-% IV SOLN
INTRAVENOUS | Status: AC
  Filled 2022-10-12: qty 100

## 2022-10-12 MED ORDER — DONEPEZIL HCL 10 MG PO TABS
10.0000 mg | ORAL_TABLET | Freq: Every day | ORAL | Status: DC
  Administered 2022-10-12 – 2022-10-15 (×4): 10 mg via ORAL
  Filled 2022-10-12 (×4): qty 1

## 2022-10-12 MED ORDER — ONDANSETRON HCL 4 MG PO TABS: 4.0000 mg | ORAL_TABLET | Freq: Four times a day (QID) | ORAL | Status: DC | PRN

## 2022-10-12 MED ORDER — POTASSIUM CHLORIDE 10 MEQ/100ML IV SOLN
10.0000 meq | INTRAVENOUS | Status: AC
  Administered 2022-10-12 (×2): 10 meq via INTRAVENOUS
  Filled 2022-10-12 (×2): qty 100

## 2022-10-12 MED ORDER — BUSPIRONE HCL 10 MG PO TABS
10.0000 mg | ORAL_TABLET | Freq: Once | ORAL | Status: AC
  Administered 2022-10-12: 10 mg via ORAL
  Filled 2022-10-12: qty 1

## 2022-10-12 MED ORDER — MUPIROCIN 2 % EX OINT
1.0000 | TOPICAL_OINTMENT | Freq: Two times a day (BID) | CUTANEOUS | Status: DC
  Administered 2022-10-13 – 2022-10-16 (×7): 1 via NASAL
  Filled 2022-10-12: qty 22

## 2022-10-12 MED ORDER — PHENYLEPHRINE HCL-NACL 20-0.9 MG/250ML-% IV SOLN
INTRAVENOUS | Status: DC | PRN
  Administered 2022-10-12: 25 ug/min via INTRAVENOUS

## 2022-10-12 MED ORDER — METHOCARBAMOL 500 MG PO TABS
500.0000 mg | ORAL_TABLET | Freq: Four times a day (QID) | ORAL | Status: DC | PRN
  Administered 2022-10-12 – 2022-10-15 (×3): 500 mg via ORAL
  Filled 2022-10-12 (×3): qty 1

## 2022-10-12 MED ORDER — PROPOFOL 500 MG/50ML IV EMUL
INTRAVENOUS | Status: DC | PRN
  Administered 2022-10-12: 35 ug/kg/min via INTRAVENOUS

## 2022-10-12 MED ORDER — POTASSIUM CHLORIDE CRYS ER 20 MEQ PO TBCR: 40.0000 meq | EXTENDED_RELEASE_TABLET | Freq: Once | ORAL | Status: DC

## 2022-10-12 MED ORDER — ORAL CARE MOUTH RINSE: 15.0000 mL | Freq: Once | OROMUCOSAL | Status: DC

## 2022-10-12 MED ORDER — HYDROCODONE-ACETAMINOPHEN 5-325 MG PO TABS
1.0000 | ORAL_TABLET | ORAL | Status: DC | PRN
  Administered 2022-10-12 – 2022-10-15 (×3): 1 via ORAL
  Filled 2022-10-12 (×3): qty 1

## 2022-10-12 MED ORDER — EPHEDRINE SULFATE-NACL 50-0.9 MG/10ML-% IV SOSY
PREFILLED_SYRINGE | INTRAVENOUS | Status: DC | PRN
  Administered 2022-10-12: 5 mg via INTRAVENOUS
  Administered 2022-10-12: 10 mg via INTRAVENOUS

## 2022-10-12 MED ORDER — MORPHINE SULFATE (PF) 2 MG/ML IV SOLN: 0.5000 mg | INTRAVENOUS | Status: DC | PRN

## 2022-10-12 MED ORDER — CEFAZOLIN SODIUM-DEXTROSE 2-4 GM/100ML-% IV SOLN
2.0000 g | INTRAVENOUS | Status: AC
  Administered 2022-10-12: 2 g via INTRAVENOUS

## 2022-10-12 MED ORDER — DONEPEZIL HCL 10 MG PO TABS
10.0000 mg | ORAL_TABLET | Freq: Once | ORAL | Status: AC
  Administered 2022-10-12: 10 mg via ORAL
  Filled 2022-10-12: qty 1

## 2022-10-12 SURGICAL SUPPLY — 48 items
BAG COUNTER SPONGE SURGICOUNT (BAG) ×1 IMPLANT
BAG SPNG CNTER NS LX DISP (BAG) ×1
BIT DRILL 4.3MMS DISTAL GRDTED (BIT) IMPLANT
BLADE CLIPPER SURG (BLADE) IMPLANT
BNDG CMPR 5X6 CHSV STRCH STRL (GAUZE/BANDAGES/DRESSINGS) ×2
BNDG COHESIVE 6X5 TAN ST LF (GAUZE/BANDAGES/DRESSINGS) ×1 IMPLANT
COVER MAYO STAND STRL (DRAPES) ×1 IMPLANT
COVER PERINEAL POST (MISCELLANEOUS) ×1 IMPLANT
COVER SURGICAL LIGHT HANDLE (MISCELLANEOUS) ×2 IMPLANT
DRAPE STERI IOBAN 125X83 (DRAPES) ×1 IMPLANT
DRESSING MEPILEX FLEX 4X4 (GAUZE/BANDAGES/DRESSINGS) ×1 IMPLANT
DRILL 4.3MMS DISTAL GRADUATED (BIT) ×1
DRSG MEPILEX FLEX 4X4 (GAUZE/BANDAGES/DRESSINGS) ×2
DRSG MEPILEX POST OP 4X8 (GAUZE/BANDAGES/DRESSINGS) IMPLANT
DURAPREP 26ML APPLICATOR (WOUND CARE) ×1 IMPLANT
ELECT REM PT RETURN 9FT ADLT (ELECTROSURGICAL) ×1
ELECTRODE REM PT RTRN 9FT ADLT (ELECTROSURGICAL) ×1 IMPLANT
GLOVE BIOGEL PI IND STRL 8 (GLOVE) ×2 IMPLANT
GLOVE ECLIPSE 7.5 STRL STRAW (GLOVE) ×2 IMPLANT
GOWN STRL REUS W/ TWL LRG LVL3 (GOWN DISPOSABLE) ×1 IMPLANT
GOWN STRL REUS W/ TWL XL LVL3 (GOWN DISPOSABLE) ×2 IMPLANT
GOWN STRL REUS W/TWL LRG LVL3 (GOWN DISPOSABLE) ×1
GOWN STRL REUS W/TWL XL LVL3 (GOWN DISPOSABLE) ×2
GUIDEPIN VERSANAIL DSP 3.2X444 (ORTHOPEDIC DISPOSABLE SUPPLIES) IMPLANT
GUIDEWIRE BALL NOSE 100CM (WIRE) IMPLANT
HFN LAG SCREW 10.5MM X 115MM (Orthopedic Implant) IMPLANT
HFN RH 130 DEG 11MM X 420MM (Nail) IMPLANT
HOLDER FOLEY CATH W/STRAP (MISCELLANEOUS) ×1 IMPLANT
KIT BASIN OR (CUSTOM PROCEDURE TRAY) ×1 IMPLANT
MANIFOLD NEPTUNE II (INSTRUMENTS) ×1 IMPLANT
NS IRRIG 1000ML POUR BTL (IV SOLUTION) ×1 IMPLANT
PACK GENERAL/GYN (CUSTOM PROCEDURE TRAY) ×1 IMPLANT
PAD ARMBOARD 7.5X6 YLW CONV (MISCELLANEOUS) ×2 IMPLANT
SCREW BONE CORTICAL 5.0X52 (Screw) IMPLANT
SCREW LAG TROCH DISP (INSTRUMENTS) IMPLANT
SPIKE FLUID TRANSFER (MISCELLANEOUS) ×1 IMPLANT
STAPLER VISISTAT 35W (STAPLE) ×1 IMPLANT
SUT VIC AB 0 CT1 27 (SUTURE) ×1
SUT VIC AB 0 CT1 27XBRD ANBCTR (SUTURE) IMPLANT
SUT VIC AB 1 CT1 27 (SUTURE) ×1
SUT VIC AB 1 CT1 27XBRD ANTBC (SUTURE) IMPLANT
SUT VIC AB 1 CTB1 27 (SUTURE) ×1 IMPLANT
SUT VIC AB 2-0 CTB1 (SUTURE) ×1 IMPLANT
TOWEL GREEN STERILE (TOWEL DISPOSABLE) ×1 IMPLANT
TRAY FOLEY MTR SLVR 16FR STAT (SET/KITS/TRAYS/PACK) ×1 IMPLANT
TRAY FOLEY W/BAG SLVR 16FR (SET/KITS/TRAYS/PACK) ×1
TRAY FOLEY W/BAG SLVR 16FR ST (SET/KITS/TRAYS/PACK) IMPLANT
WATER STERILE IRR 1000ML POUR (IV SOLUTION) ×1 IMPLANT

## 2022-10-12 NOTE — Progress Notes (Addendum)
PROGRESS NOTE   Tanner Bond  ZOX:[id number]    DOB: 1955-12-25    DOA: 10/11/2022  PCP: [name]., MD   I have briefly reviewed patients previous medical records in [facility].  Chief Complaint  Patient presents with   Fall    Possible posterior head trauma    Brief Narrative:  67 year old married male, lives with his spouse, ambulates slowly but without assistance, medical history significant for advanced dementia with behavioral changes, s/p appendectomy, right ankle fracture treated nonsurgically, alcohol use disorder in remission, tobacco use, (spouse denies documented history of HTN), brought in by ambulance to [facility] ED on 10/11/2022 following unwitnessed fall at home on 10/11/2022 at approximately 6:30 AM.  Admitted for right hip fracture/minimally displaced intertrochanteric hip fracture.  Orthopedics consulted and plan surgical fixation 6/4.   Assessment & Plan:  Principal Problem:   Closed right hip fracture (HCC) Active Problems:   Alcoholism in remission (HCC)   Hypertension   Intraventricular hemorrhage (HCC)   Dementia with behavioral disturbance (HCC)   Hypokalemia   Tobacco use disorder   Closed acute right hip fracture: - Sustained s/p mechanical fall at home on 6/3. -DG hip right: Oblique lucency along the intertrochanteric region of the right hip.  Nondisplaced fracture is possible. - CT of the right hip: Appears to have a complex right hip fracture as an report above.  As per orthopedics, this is a minimally displaced intertrochanteric hip fracture. -Orthopedics/Dr. Luiz Blare has seen today and plans surgical fixation. - N.p.o. since midnight last night for surgery today. -As per hip fracture order set, underwent femoral block by anesthesia on night of admission. - Multimodality pain control, initiated scheduled Tylenol 1 g 3 times daily.  Minimize opioids as much as possible.  Pain appears to be controlled. - No reported cardiac,  respiratory history and patient is optimized to proceed with indicated surgery with anticipated risks. -Check vitamin D levels.   Intraventricular hemorrhage: - CT head 10/11/2022: Tiny amount of intraventricular hemorrhage within the left occipital horn.  No other acute abnormality identified.  Likely related to fall and head trauma. Alert and no focal deficits. - As per EDP discussion with Dr. Bradly Bienenstock, recommends repeating head CT in 12 to 24 hours.  Follow-up CT head 6/4: No progression of trace hemorrhage at the left lateral ventricle. Pronounced brain atrophy. -Avoiding chemical DVT prophylaxis.  However will need to check with neurosurgery regarding timing of starting postop chemical DVT prophylaxis. - If mental status deteriorates, may need to repeat head CT stat.   S/p mechanical fall 10/11/2022: - No syncope as per spouse's report - Will need PT and OT evaluation postsurgery and likely SNF for STR   Hypokalemia: - Continue to replace and follow.  Did not receive the oral potassium supplement that was ordered on admission.  Magnesium 2.1.   Advanced dementia with behavioral changes: - Per spouse, etiology of dementia is unclear. - High risk for hospital delirium.  Delirium precautions - Continue prior home dose of Aricept, Namenda, buspirone, Zoloft and Seroquel. - If has agitation, may need safety sitter and as needed Haldol or Seroquel or Zyprexa.   Tobacco abuse: - Prior history of significant cigarette smoking but lately infrequent use of cigars.   Hypertension: - Spouse denies history of same and patient not on any prescription meds.  BP controlled. - Monitor.   Alcohol use disorder in remission: - History as noted above.  Monitor.  Vitamin D deficiency: 25-hydroxy vitamin D  levels 15.54 ng/ml. As per up-to-date review, recommends 800 to 1000 units vitamin D daily.  Ordered.  Body mass index is 22.57 kg/m.   ACP Documents: None present but does have living  will and HCPOA per discussion with spouse last night and she is going to bring in the necessary paperwork to be scanned in. DVT prophylaxis: SCDs Start: 10/11/22 1824     Code Status: DNR:  Family Communication: None at bedside. Disposition:  Status is: Inpatient Remains inpatient appropriate because: Awaiting right hip surgery.     Consultants:   Orthopedics/Dr. Luiz Blare  Procedures:     Antimicrobials:      Subjective:  Pleasantly confused.  As per nursing, no agitation but keeps pulling off clothes, telemetry etc.  Objective:   Vitals:   10/11/22 2110 10/11/22 2151 10/12/22 0342 10/12/22 0737  BP: (!) 114/56 116/67 100/66 135/85  Pulse: 79 73 63 84  Resp: 15 13 12 18   Temp: 99.6 F (37.6 C)  99 F (37.2 C) 97.9 F (36.6 C)  TempSrc:   Oral Oral  SpO2: 100% 99% 100% 100%  Weight:      Height:        General exam: Middle-age male, looks older than stated age, moderately built and thinly nourished, lying completely naked with both his legs dangling on the left side of the bed, patient holding his penis with his left hand and likely wants to urinate. Respiratory system: Clear to auscultation. Respiratory effort normal. Cardiovascular system: S1 & S2 heard, RRR. No JVD, murmurs, rubs, gallops or clicks. No pedal edema.  Telemetry personally reviewed: Sinus rhythm, no further indication for telemetry and hence discontinued and transferring to medical bed. Gastrointestinal system: Abdomen is nondistended, soft and nontender. No organomegaly or masses felt. Normal bowel sounds heard. Central nervous system: Alert and oriented only to self. No focal neurological deficits. Extremities: Symmetric 5 x 5 power except right lower extremity where movements are restricted due to pain.  Right lower extremity is shortened and externally rotated. Skin: No rashes, lesions or ulcers Psychiatry: Judgement and insight impaired. Mood & affect appropriate.     Data Reviewed:   I have  personally reviewed following labs and imaging studies   CBC: Recent Labs  Lab 10/11/22 1030 10/12/22 0300  WBC 13.4* 8.3  HGB 14.0 13.5  HCT 41.3 38.2*  MCV 98.3 93.9  PLT 178 174    Basic Metabolic Panel: Recent Labs  Lab 10/11/22 1030 10/12/22 0300  NA 134* 134*  K 3.4* 3.4*  CL 102 102  CO2 22 23  GLUCOSE 126* 111*  BUN 11 12  CREATININE 0.81 0.71  CALCIUM 8.8* 8.5*  MG  --  2.1    Liver Function Tests: No results for input(s): "AST", "ALT", "ALKPHOS", "BILITOT", "PROT", "ALBUMIN" in the last 168 hours.  CBG: Recent Labs  Lab 10/12/22 0734  GLUCAP 118*    Microbiology Studies:   Recent Results (from the past 240 hour(s))  Surgical pcr screen     Status: Abnormal   Collection Time: 10/12/22  3:25 AM   Specimen: Nasal Mucosa; Nasal Swab  Result Value Ref Range Status   MRSA, PCR NEGATIVE NEGATIVE Final   Staphylococcus aureus POSITIVE (A) NEGATIVE Final    Comment: (NOTE) The Xpert SA Assay (FDA approved for NASAL specimens in patients 74 years of age and older), is one component of a comprehensive surveillance program. It is not intended to diagnose infection nor to guide or monitor treatment. Performed at [facility]  [facility], [address] [place], [place] [address]     Radiology Studies:  CT HEAD WO CONTRAST ( )  Result Date: 10/12/2022 CLINICAL DATA:  Headache, sudden and severe.  Altered mental status. EXAM: CT HEAD WITHOUT CONTRAST TECHNIQUE: Contiguous axial images were obtained from the base of the skull through the vertex without intravenous contrast. RADIATION DOSE REDUCTION: This exam was performed according to the departmental dose-optimization program which includes automated exposure control, adjustment of the mA and/or kV according to patient size and/or use of iterative reconstruction technique. COMPARISON:  Head CT from yesterday FINDINGS: Brain: Trace high-density hemorrhage at the occipital horn of the left lateral ventricle. No  interval progression. Advanced brain atrophy with ventriculomegaly. No evidence of acute infarct, tract of hydrocephalus, mass, or collection. Vascular: No hyperdense vessel or unexpected calcification. Skull: Normal. Negative for fracture or focal lesion. Sinuses/Orbits: No acute finding. IMPRESSION: No progression of trace hemorrhage at the left lateral ventricle. Pronounced brain atrophy. Electronically Signed   By: Tiburcio Pea M.D.   On: 10/12/2022 06:33   CT Hip Right Wo Contrast  Result Date: 10/11/2022 CLINICAL DATA:  Hip trauma.  Fracture suspected.  Fall. EXAM: CT OF THE RIGHT HIP WITHOUT CONTRAST TECHNIQUE: Multidetector CT imaging of the right hip was performed according to the standard protocol. Multiplanar CT image reconstructions were also generated. RADIATION DOSE REDUCTION: This exam was performed according to the departmental dose-optimization program which includes automated exposure control, adjustment of the mA and/or kV according to patient size and/or use of iterative reconstruction technique. COMPARISON:  Pelvis and right hip radiographs 10/11/2022 FINDINGS: Bones/Joint/Cartilage There is an oblique fracture extending from the anterior inferior aspect of the greater trochanter through the anterior aspect of the lesser trochanter (coronal series 4 images 27 through 30). There is up to 9 mm transverse dimension diastasis within the anterior superior aspect of this fracture but no significant diastasis within the inferior aspect of this fracture. There is an additional nondisplaced oblique fracture within the lesser tuberosity (axial series 2, images 47-51). Acute fracture of the far posterior aspect of the femoral neck-greater trochanter junction (axial series 3, image 123 and coronal series 4, image 34). There is mild anterior apex angulation of the dominant anterior intertrochanteric and posterior distal femoral neck fractures. Mild-to-moderate right greater than left sacroiliac joint  space narrowing, subchondral sclerosis, and anterior peripheral osteophytosis. The pubic symphysis joint space is maintained. Ligaments Suboptimally assessed by CT. Muscles and Tendons There is mildly decreased definition within the just psoas musculotendinous junction which may be secondary to mild posttraumatic interstitial muscle tearing and/or blood products (axial series 2 images 45 through 48). No gross tendon tear is visualized. Soft tissues Moderate partially visualized sigmoid diverticulosis. IMPRESSION: 1. Acute oblique fracture extending from the anterior inferior aspect of the greater trochanter through the anterior aspect of the lesser trochanter. There is up to 9 mm transverse dimension diastasis within the anterior superior aspect of this fracture but no significant diastasis within the inferior aspect of this fracture. 2. Additional nondisplaced acute oblique fracture within the lesser tuberosity. 3. Acute fracture of the far posterior aspect of the distal femoral neck at the junction with the greater trochanter. There is mild anterior apex angulation of the dominant anterior intertrochanteric and posterior distal femoral neck fractures. Electronically Signed   By: Neita Garnet M.D.   On: 10/11/2022 16:40   CT Head Wo Contrast  Result Date: 10/11/2022 CLINICAL DATA:  67 year old male with head injury. EXAM: CT HEAD WITHOUT  CONTRAST TECHNIQUE: Contiguous axial images were obtained from the base of the skull through the vertex without intravenous contrast. RADIATION DOSE REDUCTION: This exam was performed according to the departmental dose-optimization program which includes automated exposure control, adjustment of the mA and/or kV according to patient size and/or use of iterative reconstruction technique. COMPARISON:  None Available. FINDINGS: Brain: A 3 mm hyperdensity within the occipital horn of the LEFT LATERAL ventricle is noted compatible with a tiny amount of intraventricular hemorrhage.  No other hemorrhage identified. There is no evidence of mass lesion or mass effect, hydrocephalus, extra-axial collection, acute infarct or midline shift. Atrophy and probable mild chronic small vessel white matter ischemic changes are noted. Vascular: Carotid atherosclerotic calcifications are noted. Skull: Normal. Negative for fracture or focal lesion. Sinuses/Orbits: No acute finding. Other: None. IMPRESSION: 1. Tiny amount of intraventricular hemorrhage within LEFT occipital horn. No other acute abnormality identified. 2. Atrophy and probable mild chronic small vessel white matter ischemic changes. Critical Value/emergent results were called by telephone at the time of interpretation on 10/11/2022 at 1:27 pm to provider [facility] RAY , who verbally acknowledged these results. Electronically Signed   By: Harmon Pier M.D.   On: 10/11/2022 13:27   CT Cervical Spine Wo Contrast  Result Date: 10/11/2022 CLINICAL DATA:  Neck trauma (Age >= 65y) EXAM: CT CERVICAL SPINE WITHOUT CONTRAST TECHNIQUE: Multidetector CT imaging of the cervical spine was performed without intravenous contrast. Multiplanar CT image reconstructions were also generated. RADIATION DOSE REDUCTION: This exam was performed according to the departmental dose-optimization program which includes automated exposure control, adjustment of the mA and/or kV according to patient size and/or use of iterative reconstruction technique. COMPARISON:  None Available. FINDINGS: Alignment: Mild straightening of the normal cervical lordosis. Skull base and vertebrae: No acute fracture. No primary bone lesion or focal pathologic process. Soft tissues and spinal canal: No prevertebral fluid or swelling. No visible canal hematoma. Disc levels:  No evidence of high-grade spinal canal stenosis Upper chest: Negative. Other: None. IMPRESSION: No acute fracture or traumatic subluxation of the cervical spine. Electronically Signed   By: Lorenza Cambridge M.D.   On: 10/11/2022  11:17   DG Hip Unilat W or Wo Pelvis 2-3 Views Right  Result Date: 10/11/2022 CLINICAL DATA:  Pain after fall EXAM: DG HIP (WITH OR WITHOUT PELVIS) 3V RIGHT COMPARISON:  None Available. FINDINGS: Oblique lucency along the intertrochanteric region of the right hip. Nondisplaced fracture is possible. Recommend further characterization with CT. Otherwise no additional fracture or dislocation. Preserved joint spaces. There is global osteopenia. Well rounded densities in the seen in the pelvis consistent with phleboliths. There also other vascular calcifications noted. IMPRESSION: Oblique lucency along the intertrochanteric region of the right hip. Nondisplaced fracture is possible. Recommend further characterization with CT. Electronically Signed   By: Karen Kays M.D.   On: 10/11/2022 11:03    Scheduled Meds:    acetaminophen  1,000 mg Oral TID   busPIRone  10 mg Oral TID   docusate sodium  100 mg Oral BID   donepezil  10 mg Oral QHS   memantine  5 mg Oral BID   potassium chloride  40 mEq Oral Once   QUEtiapine  25 mg Oral QHS   sertraline  100 mg Oral Daily    Continuous Infusions:    lactated ringers 1,000 mL with potassium chloride 20 mEq infusion 50 mL/hr at 10/12/22 0012   methocarbamol (ROBAXIN) IV       LOS: 1 day  Marcellus Scott, MD,  FACP, FHM, [facility], [facility], [facility]   [facility] [place]     To contact the attending provider between 7A-7P or the covering provider during after hours 7P-7A, please log into the web site [email or web address] and access using universal [place] password for that web site. If you do not have the password, please call the hospital operator.  10/12/2022, 8:37 AM

## 2022-10-12 NOTE — Consult Note (Signed)
Reason for Consult: Right hip fracture Referring Physician: Hospitalist  Tanner Bond is an 67 y.o. male.  HPI: 67 year old male with history of dementia who was admitted to the hospital with right hip pain.  CT scan was ultimately obtained which showed that the patient has a minimally displaced intertrochanteric hip fracture.  We are consulted for management of the fracture.  Past Medical History:  Diagnosis Date   Alcohol abuse    Liver dysfunction     Past Surgical History:  Procedure Laterality Date   APPENDECTOMY     as child   URETHRAL DILATION     as child    Family History  Problem Relation Age of Onset   Dementia Mother    Prostate cancer Father     Social History:  reports that he has been smoking cigars. He has never used smokeless tobacco. He reports current alcohol use. He reports that he does not use drugs.  Allergies: No Known Allergies  Medications: I have reviewed the patient's current medications.  Results for orders placed or performed during the hospital encounter of 10/11/22 (from the past 48 hour(s))  ABO/Rh     Status: None   Collection Time: 10/11/22 10:26 AM  Result Value Ref Range   ABO/RH(D)      O POS Performed at [facility], [address], [place], [place] [address]   CBC     Status: Abnormal   Collection Time: 10/11/22 10:30 AM  Result Value Ref Range   WBC 13.4 (H) 4.0 - 10.5 K/uL   RBC 4.20 (L) 4.22 - 5.81 MIL/uL   Hemoglobin 14.0 13.0 - 17.0 g/dL   HCT 08.6 57.8 - 46.9 %   MCV 98.3 80.0 - 100.0 fL   MCH 33.3 26.0 - 34.0 pg   MCHC 33.9 30.0 - 36.0 g/dL   RDW 62.9 52.8 - 41.3 %   Platelets 178 150 - 400 K/uL   nRBC 0.0 0.0 - 0.2 %    Comment: Performed at [facility], [address] [place], [place] [address]  Basic metabolic panel     Status: Abnormal   Collection Time: 10/11/22 10:30 AM  Result Value Ref Range   Sodium 134 (L) 135 - 145 mmol/L   Potassium 3.4 (L) 3.5 - 5.1 mmol/L   Chloride 102 98 - 111  mmol/L   CO2 22 22 - 32 mmol/L   Glucose, Bld 126 (H) 70 - 99 mg/dL    Comment: Glucose reference range applies only to samples taken after fasting for at least 8 hours.   BUN 11 8 - 23 mg/dL   Creatinine, Ser 0.27 0.61 - 1.24 mg/dL   Calcium 8.8 (L) 8.9 - 10.3 mg/dL   GFR, Estimated >25 >36 mL/min    Comment: (NOTE) Calculated using the CKD-EPI Creatinine Equation (2021)    Anion gap 10 5 - 15    Comment: Performed at [facility], [address] [place], [place] [address]  Type and screen [facility]     Status: None   Collection Time: 10/11/22  7:13 PM  Result Value Ref Range   ABO/RH(D) O POS    Antibody Screen NEG    Sample Expiration      10/14/2022,2359 Performed at [facility], [address] [place], [place] [address]   Magnesium     Status: None   Collection Time: 10/12/22  3:00 AM  Result Value Ref Range  Magnesium 2.1 1.7 - 2.4 mg/dL    Comment: Performed at [facility], [address], [place], [place] [address]  CBC     Status: Abnormal   Collection Time: 10/12/22  3:00 AM  Result Value Ref Range   WBC 8.3 4.0 - 10.5 K/uL   RBC 4.07 (L) 4.22 - 5.81 MIL/uL   Hemoglobin 13.5 13.0 - 17.0 g/dL   HCT 21.3 (L) 08.6 - 57.8 %   MCV 93.9 80.0 - 100.0 fL   MCH 33.2 26.0 - 34.0 pg   MCHC 35.3 30.0 - 36.0 g/dL   RDW 46.9 62.9 - 52.8 %   Platelets 174 150 - 400 K/uL   nRBC 0.0 0.0 - 0.2 %    Comment: Performed at [facility], [address], [place], [place] [address]  Basic metabolic panel     Status: Abnormal   Collection Time: 10/12/22  3:00 AM  Result Value Ref Range   Sodium 134 (L) 135 - 145 mmol/L   Potassium 3.4 (L) 3.5 - 5.1 mmol/L   Chloride 102 98 - 111 mmol/L   CO2 23 22 - 32 mmol/L   Glucose, Bld 111 (H) 70 - 99 mg/dL    Comment: Glucose reference range applies only to samples taken after fasting for at least 8 hours.   BUN 12 8 - 23 mg/dL   Creatinine, Ser 4.01 0.61 - 1.24 mg/dL   Calcium 8.5 (L) 8.9 -  10.3 mg/dL   GFR, Estimated >02 >72 mL/min    Comment: (NOTE) Calculated using the CKD-EPI Creatinine Equation (2021)    Anion gap 9 5 - 15    Comment: Performed at [facility], [address], [place], [place] [address]  Surgical pcr screen     Status: Abnormal   Collection Time: 10/12/22  3:25 AM   Specimen: Nasal Mucosa; Nasal Swab  Result Value Ref Range   MRSA, PCR NEGATIVE NEGATIVE   Staphylococcus aureus POSITIVE (A) NEGATIVE    Comment: (NOTE) The Xpert SA Assay (FDA approved for NASAL specimens in patients 1 years of age and older), is one component of a comprehensive surveillance program. It is not intended to diagnose infection nor to guide or monitor treatment. Performed at [facility], [address] [place], [place] [address]   Glucose, capillary     Status: Abnormal   Collection Time: 10/12/22  7:34 AM  Result Value Ref Range   Glucose-Capillary 118 (H) 70 - 99 mg/dL    Comment: Glucose reference range applies only to samples taken after fasting for at least 8 hours.    CT HEAD WO CONTRAST ( )  Result Date: 10/12/2022 CLINICAL DATA:  Headache, sudden and severe.  Altered mental status. EXAM: CT HEAD WITHOUT CONTRAST TECHNIQUE: Contiguous axial images were obtained from the base of the skull through the vertex without intravenous contrast. RADIATION DOSE REDUCTION: This exam was performed according to the departmental dose-optimization program which includes automated exposure control, adjustment of the mA and/or kV according to patient size and/or use of iterative reconstruction technique. COMPARISON:  Head CT from yesterday FINDINGS: Brain: Trace high-density hemorrhage at the occipital horn of the left lateral ventricle. No interval progression. Advanced brain atrophy with ventriculomegaly. No evidence of acute infarct, tract of hydrocephalus, mass, or collection. Vascular: No hyperdense vessel or unexpected calcification. Skull: Normal. Negative for  fracture or focal lesion. Sinuses/Orbits: No acute finding. IMPRESSION: No progression of trace hemorrhage at the left lateral  ventricle. Pronounced brain atrophy. Electronically Signed   By: Tiburcio Pea M.D.   On: 10/12/2022 06:33   CT Hip Right Wo Contrast  Result Date: 10/11/2022 CLINICAL DATA:  Hip trauma.  Fracture suspected.  Fall. EXAM: CT OF THE RIGHT HIP WITHOUT CONTRAST TECHNIQUE: Multidetector CT imaging of the right hip was performed according to the standard protocol. Multiplanar CT image reconstructions were also generated. RADIATION DOSE REDUCTION: This exam was performed according to the departmental dose-optimization program which includes automated exposure control, adjustment of the mA and/or kV according to patient size and/or use of iterative reconstruction technique. COMPARISON:  Pelvis and right hip radiographs 10/11/2022 FINDINGS: Bones/Joint/Cartilage There is an oblique fracture extending from the anterior inferior aspect of the greater trochanter through the anterior aspect of the lesser trochanter (coronal series 4 images 27 through 30). There is up to 9 mm transverse dimension diastasis within the anterior superior aspect of this fracture but no significant diastasis within the inferior aspect of this fracture. There is an additional nondisplaced oblique fracture within the lesser tuberosity (axial series 2, images 47-51). Acute fracture of the far posterior aspect of the femoral neck-greater trochanter junction (axial series 3, image 123 and coronal series 4, image 34). There is mild anterior apex angulation of the dominant anterior intertrochanteric and posterior distal femoral neck fractures. Mild-to-moderate right greater than left sacroiliac joint space narrowing, subchondral sclerosis, and anterior peripheral osteophytosis. The pubic symphysis joint space is maintained. Ligaments Suboptimally assessed by CT. Muscles and Tendons There is mildly decreased definition within the  just psoas musculotendinous junction which may be secondary to mild posttraumatic interstitial muscle tearing and/or blood products (axial series 2 images 45 through 48). No gross tendon tear is visualized. Soft tissues Moderate partially visualized sigmoid diverticulosis. IMPRESSION: 1. Acute oblique fracture extending from the anterior inferior aspect of the greater trochanter through the anterior aspect of the lesser trochanter. There is up to 9 mm transverse dimension diastasis within the anterior superior aspect of this fracture but no significant diastasis within the inferior aspect of this fracture. 2. Additional nondisplaced acute oblique fracture within the lesser tuberosity. 3. Acute fracture of the far posterior aspect of the distal femoral neck at the junction with the greater trochanter. There is mild anterior apex angulation of the dominant anterior intertrochanteric and posterior distal femoral neck fractures. Electronically Signed   By: Neita Garnet M.D.   On: 10/11/2022 16:40   CT Head Wo Contrast  Result Date: 10/11/2022 CLINICAL DATA:  67 year old male with head injury. EXAM: CT HEAD WITHOUT CONTRAST TECHNIQUE: Contiguous axial images were obtained from the base of the skull through the vertex without intravenous contrast. RADIATION DOSE REDUCTION: This exam was performed according to the departmental dose-optimization program which includes automated exposure control, adjustment of the mA and/or kV according to patient size and/or use of iterative reconstruction technique. COMPARISON:  None Available. FINDINGS: Brain: A 3 mm hyperdensity within the occipital horn of the LEFT LATERAL ventricle is noted compatible with a tiny amount of intraventricular hemorrhage. No other hemorrhage identified. There is no evidence of mass lesion or mass effect, hydrocephalus, extra-axial collection, acute infarct or midline shift. Atrophy and probable mild chronic small vessel white matter ischemic changes  are noted. Vascular: Carotid atherosclerotic calcifications are noted. Skull: Normal. Negative for fracture or focal lesion. Sinuses/Orbits: No acute finding. Other: None. IMPRESSION: 1. Tiny amount of intraventricular hemorrhage within LEFT occipital horn. No other acute abnormality identified. 2. Atrophy and probable mild chronic small vessel white  matter ischemic changes. Critical Value/emergent results were called by telephone at the time of interpretation on 10/11/2022 at 1:27 pm to provider [facility] [place] , who verbally acknowledged these results. Electronically Signed   By: Harmon Pier M.D.   On: 10/11/2022 13:27   CT Cervical Spine Wo Contrast  Result Date: 10/11/2022 CLINICAL DATA:  Neck trauma (Age >= 65y) EXAM: CT CERVICAL SPINE WITHOUT CONTRAST TECHNIQUE: Multidetector CT imaging of the cervical spine was performed without intravenous contrast. Multiplanar CT image reconstructions were also generated. RADIATION DOSE REDUCTION: This exam was performed according to the departmental dose-optimization program which includes automated exposure control, adjustment of the mA and/or kV according to patient size and/or use of iterative reconstruction technique. COMPARISON:  None Available. FINDINGS: Alignment: Mild straightening of the normal cervical lordosis. Skull base and vertebrae: No acute fracture. No primary bone lesion or focal pathologic process. Soft tissues and spinal canal: No prevertebral fluid or swelling. No visible canal hematoma. Disc levels:  No evidence of high-grade spinal canal stenosis Upper chest: Negative. Other: None. IMPRESSION: No acute fracture or traumatic subluxation of the cervical spine. Electronically Signed   By: Lorenza Cambridge M.D.   On: 10/11/2022 11:17   DG Hip Unilat W or Wo Pelvis 2-3 Views Right  Result Date: 10/11/2022 CLINICAL DATA:  Pain after fall EXAM: DG HIP (WITH OR WITHOUT PELVIS) 3V RIGHT COMPARISON:  None Available. FINDINGS: Oblique lucency along the  intertrochanteric region of the right hip. Nondisplaced fracture is possible. Recommend further characterization with CT. Otherwise no additional fracture or dislocation. Preserved joint spaces. There is global osteopenia. Well rounded densities in the seen in the pelvis consistent with phleboliths. There also other vascular calcifications noted. IMPRESSION: Oblique lucency along the intertrochanteric region of the right hip. Nondisplaced fracture is possible. Recommend further characterization with CT. Electronically Signed   By: Karen Kays M.D.   On: 10/11/2022 11:03    ROS ROS: I have reviewed the patient's review of systems thoroughly and there are no positive responses as relates to the HPI.  Blood pressure 135/85, pulse 84, temperature 97.9 F (36.6 C), temperature source Oral, resp. rate 18, height 6' (1.829 m), weight 75.5 kg, SpO2 100 %. Physical Exam Well-developed well-nourished patient in no acute distress. Alert and oriented x3 HEENT:within normal limits Cardiac: Regular rate and rhythm Pulmonary: Lungs clear to auscultation Abdomen: Soft and nontender.  Normal active bowel sounds  Musculoskeletal: (Right hip pain with internal and external rotation.  Neurovascular intact distally.  Slight external rotation.  Pain with all movement.)  Assessment/Plan: 67 year old male with history of dementia who had a fall and has minimally displaced right hip fracture.  We have a long discussion with the patient and I will have a discussion with his family about potential treatment options.  Ultimately fixation is going to be the best way to allow the patient to be mobile with the least amount of pain.  The patient is well aware of the risk of bleeding infection, need for further surgery, and failure of the surgery to work.  He is aware of the slight risk of death in and around the time of surgery and I discussed these risks with the family as well.  We will plan on proceeding with surgery later  today.  Patient will be kept n.p.o. at this point.  Harvie Junior 10/12/2022, 8:17 AM

## 2022-10-12 NOTE — Anesthesia Preprocedure Evaluation (Addendum)
Anesthesia Evaluation  Patient identified by MRN, date of birth, ID band Patient awake    Reviewed: Allergy & Precautions, NPO status , Patient's Chart, lab work & pertinent test results  Airway Mallampati: III  TM Distance: >3 FB Neck ROM: Full    Dental  (+) Dental Advisory Given, Chipped,    Pulmonary Current Smoker and Patient abstained from smoking.   Pulmonary exam normal breath sounds clear to auscultation       Cardiovascular hypertension, Normal cardiovascular exam Rhythm:Regular Rate:Normal     Neuro/Psych  PSYCHIATRIC DISORDERS     Dementia negative neurological ROS     GI/Hepatic negative GI ROS,,,(+) Cirrhosis     substance abuse  alcohol use  Endo/Other  negative endocrine ROS    Renal/GU negative Renal ROS  negative genitourinary   Musculoskeletal negative musculoskeletal ROS (+)    Abdominal   Peds  Hematology negative hematology ROS (+)   Anesthesia Other Findings medical history significant for advanced dementia with behavioral changes, s/p appendectomy, right ankle fracture treated nonsurgically, alcohol use disorder in remission, tobacco use, (spouse denies documented history of HTN), brought in by ambulance to [facility] ED on 10/11/2022 following unwitnessed fall at home on 10/11/2022 at approximately 6:30 AM.  Admitted for right hip fracture/minimally displaced intertrochanteric hip fractur  Reproductive/Obstetrics                             Anesthesia Physical Anesthesia Plan  ASA: 3  Anesthesia Plan: Spinal   Post-op Pain Management:    Induction: Intravenous  PONV Risk Score and Plan: Dexamethasone, Ondansetron and TIVA  Airway Management Planned: Natural Airway  Additional Equipment:   Intra-op Plan:   Post-operative Plan:   Informed Consent: I have reviewed the patients History and Physical, chart, labs and discussed the procedure including  the risks, benefits and alternatives for the proposed anesthesia with the patient or authorized representative who has indicated his/her understanding and acceptance.   Patient has DNR.  Discussed DNR with power of attorney and Suspend DNR.   Dental advisory given  Plan Discussed with: CRNA  Anesthesia Plan Comments:        Anesthesia Quick Evaluation

## 2022-10-12 NOTE — Progress Notes (Signed)
Initial Nutrition Assessment  DOCUMENTATION CODES:   Not applicable  INTERVENTION:  Once diet resumes, recommend: Regular diet  Ensure Enlive po BID, each supplement provides 350 kcal and 20 grams of protein.  NUTRITION DIAGNOSIS:   Increased nutrient needs related to post-op healing, hip fracture as evidenced by estimated needs.  GOAL:   Patient will meet greater than or equal to 90% of their needs  MONITOR:   Diet advancement, Labs, Weight trends, Skin  REASON FOR ASSESSMENT:   Consult Hip fracture protocol  ASSESSMENT:   Pt admitted from home with R hip fracture and small IVH after a fall. PMH significant or advanced dementia, R ankle fracture treated nonsurgically, alcohol use disorder (in remission), tobacco use.   Ortho plans for fixation of R hip today. NPO for procedure.   Oriented to person and place.  Pt being transported off unit at time of visit. Unable to obtain detailed nutrition related history at this time.   No meal completions on file to review at this time.   Unfortunately, there is no documented weight history on file to review within the last year. Current weight noted to be 75.5 kg, uncertain whether this is actual versus stated weight.   Medications: Vitamin D3, colace  Labs: reviewed  NUTRITION - FOCUSED PHYSICAL EXAM: Pt unavailable. Deferred to follow up.   Diet Order:   Diet Order             Diet NPO time specified  Diet effective now                   EDUCATION NEEDS:   No education needs have been identified at this time  Skin:  Skin Assessment: Reviewed RN Assessment  Last BM:  6/3  Height:   Ht Readings from Last 1 Encounters:  10/12/22 6' (1.829 m)    Weight:   Wt Readings from Last 1 Encounters:  10/12/22 75.5 kg   BMI:  Body mass index is 22.57 kg/m.  Estimated Nutritional Needs:   Kcal:  2000-2200  Protein:  100-115g  Fluid:  >/=2L  Drusilla Kanner, RDN, LDN Clinical Nutrition

## 2022-10-12 NOTE — Transfer of Care (Signed)
Immediate Anesthesia Transfer of Care Note  Patient: Tanner Bond  Procedure(s) Performed: INTRAMEDULLARY (IM) NAIL INTERTROCHANTERIC (Right: Hip)  Patient Location: PACU  Anesthesia Type:Spinal  Level of Consciousness: awake, drowsy, and patient cooperative  Airway & Oxygen Therapy: Patient Spontanous Breathing and Patient connected to face mask oxygen  Post-op Assessment: Report given to RN, Post -op Vital signs reviewed and stable, and Patient moving all extremities X 4  Post vital signs: Reviewed and stable  Last Vitals:  Vitals Value Taken Time  BP 88/63 10/12/22 1417  Temp    Pulse 72 10/12/22 1417  Resp 15 10/12/22 1417  SpO2 98 % 10/12/22 1417  Vitals shown include unvalidated device data.  Last Pain:  Vitals:   10/12/22 1043  TempSrc: Oral  PainSc:          Complications: No notable events documented.

## 2022-10-12 NOTE — Progress Notes (Signed)
I spoke with the patients wife and she will be coming to the hospital this morning to sign consent for surgery.

## 2022-10-12 NOTE — Anesthesia Procedure Notes (Signed)
Procedure Name: MAC Date/Time: 10/12/2022 1:11 PM  Performed by: Aundria Rud, CRNAPre-anesthesia Checklist: Patient identified, Emergency Drugs available, Suction available and Patient being monitored Patient Re-evaluated:Patient Re-evaluated prior to induction Oxygen Delivery Method: Simple face mask Preoxygenation: Pre-oxygenation with 100% oxygen Induction Type: IV induction Placement Confirmation: positive ETCO2 and CO2 detector Dental Injury: Teeth and Oropharynx as per pre-operative assessment

## 2022-10-13 ENCOUNTER — Encounter (HOSPITAL_COMMUNITY): Payer: Self-pay | Admitting: Orthopedic Surgery

## 2022-10-13 DIAGNOSIS — S72001D Fracture of unspecified part of neck of right femur, subsequent encounter for closed fracture with routine healing: Secondary | ICD-10-CM | POA: Diagnosis not present

## 2022-10-13 LAB — CBC
HCT: 34.8 % — ABNORMAL LOW (ref 39.0–52.0)
Hemoglobin: 12.2 g/dL — ABNORMAL LOW (ref 13.0–17.0)
MCH: 34.2 pg — ABNORMAL HIGH (ref 26.0–34.0)
MCHC: 35.1 g/dL (ref 30.0–36.0)
MCV: 97.5 fL (ref 80.0–100.0)
Platelets: 164 10*3/uL (ref 150–400)
RBC: 3.57 MIL/uL — ABNORMAL LOW (ref 4.22–5.81)
RDW: 12.2 % (ref 11.5–15.5)
WBC: 7.1 10*3/uL (ref 4.0–10.5)
nRBC: 0 % (ref 0.0–0.2)

## 2022-10-13 LAB — MISC LABCORP TEST (SEND OUT): Labcorp test code: 83935

## 2022-10-13 LAB — BASIC METABOLIC PANEL
Anion gap: 10 (ref 5–15)
BUN: 9 mg/dL (ref 8–23)
CO2: 25 mmol/L (ref 22–32)
Calcium: 8.3 mg/dL — ABNORMAL LOW (ref 8.9–10.3)
Chloride: 102 mmol/L (ref 98–111)
Creatinine, Ser: 0.74 mg/dL (ref 0.61–1.24)
GFR, Estimated: >60 mL/min (ref >60)
Glucose, Bld: 105 mg/dL — ABNORMAL HIGH (ref 70–99)
Potassium: 3.6 mmol/L (ref 3.5–5.1)
Sodium: 137 mmol/L (ref 135–145)

## 2022-10-13 MED ORDER — BUPIVACAINE IN DEXTROSE 0.75-8.25 % IT SOLN
INTRATHECAL | Status: DC | PRN
  Administered 2022-10-12: 1.8 mL via INTRATHECAL

## 2022-10-13 MED ORDER — VITAMIN D (ERGOCALCIFEROL) 1.25 MG (50000 UNIT) PO CAPS
50000.0000 [IU] | ORAL_CAPSULE | ORAL | Status: DC
  Administered 2022-10-13: 50000 [IU] via ORAL
  Filled 2022-10-13: qty 1

## 2022-10-13 MED ORDER — TRAMADOL HCL 50 MG PO TABS: 50.0000 mg | ORAL_TABLET | Freq: Four times a day (QID) | ORAL | 0 refills | Status: DC | PRN

## 2022-10-13 NOTE — Anesthesia Postprocedure Evaluation (Signed)
Anesthesia Post Note  Patient: Tanner Bond  Procedure(s) Performed: INTRAMEDULLARY (IM) NAIL INTERTROCHANTERIC (Right: Hip)     Patient location during evaluation: PACU Anesthesia Type: Spinal Level of consciousness: oriented and awake and alert Pain management: pain level controlled Vital Signs Assessment: post-procedure vital signs reviewed and stable Respiratory status: spontaneous breathing, respiratory function stable and patient connected to nasal cannula oxygen Cardiovascular status: blood pressure returned to baseline and stable Postop Assessment: no headache, no backache and no apparent nausea or vomiting Anesthetic complications: no  No notable events documented.  Last Vitals:  Vitals:   10/13/22 0725 10/13/22 1531  BP: 113/82 110/71  Pulse: 76 89  Resp: 18 19  Temp: 36.6 C 36.6 C  SpO2: 100%     Last Pain:  Vitals:   10/13/22 1000  TempSrc:   PainSc: 0-No pain                 Sanyah Molnar L Shaheen Mende

## 2022-10-13 NOTE — Progress Notes (Signed)
Patient ID: Tanner Bond, male   DOB: 08/15/1955, 67 y.o.   MRN: [id number] I was called about this patient briefly the other day just prior to his going to the OR for his hip replacement had a couple dots of intraventricular hemorrhage in the temporal horn of the left lateral ventricle.  This is stable on follow-up CT scans patient is okay for Lovenox to prophylax against DVT.

## 2022-10-13 NOTE — TOC Initial Note (Signed)
Transition of Care [facility]) - Initial/Assessment Note    Patient Details  Name: Tanner Bond MRN: [id number] Date of Birth: 03/16/1956  Transition of Care [facility]) CM/SW Contact:    Tanner Frederick, LCSW Phone Number: 10/13/2022, 4:10 PM  Clinical Narrative:     Pt oriented x1, CSW spoke with wife Tanner Bond in pt room.  Pt from home with Tanner Bond.  Pt is seen at [facility], does have HH aide through the [place].  Tanner Bond concerned about pt requiring long term care at this point.  He is not eligible for this through [place], per Tanner Bond.  She is requesting help with medicaid screening/application.  She is open to SNF placement, we are still awaiting PT recommendations, also agreeable to using medicare for SNF placement.  Medicare choice document provided.  Email sent to [place]/financial counseling for medicaid screening.                Expected Discharge Plan: Skilled Nursing Facility Barriers to Discharge: Continued Medical Work up, SNF Pending bed offer   Patient Goals and CMS Choice   CMS Medicare.gov Compare Post Acute Care list provided to:: Patient Represenative (must comment) (wife) Choice offered to / list presented to : Spouse      Expected Discharge Plan and Services In-house Referral: Clinical Social Work   Post Acute Care Choice:  (awaiting PT recs) Living arrangements for the past 2 months: Single Family Home                                      Prior Living Arrangements/Services Living arrangements for the past 2 months: Single Family Home Lives with:: Spouse          Need for Family Participation in Patient Care: Yes (Comment) Care giver support system in place?: Yes (comment) Current home services: Homehealth aide (from [place]) Criminal Activity/Legal Involvement Pertinent to Current Situation/Hospitalization: Yes - Comment as needed  Activities of Daily Living      Permission Sought/Granted                  Emotional Assessment Appearance::  Appears stated age Attitude/Demeanor/Rapport: Engaged Affect (typically observed): Pleasant Orientation: : Oriented to Self      Admission diagnosis:  Closed right hip fracture (HCC) [S72.001A] Intraventricular hemorrhage (HCC) [I61.5] Fall, initial encounter [W19.XXXA] Closed right hip fracture, initial encounter (HCC) [S72.001A] Patient Active Problem List   Diagnosis Date Noted   Closed right hip fracture (HCC) 10/11/2022   Intraventricular hemorrhage (HCC) 10/11/2022   Dementia with behavioral disturbance (HCC) 10/11/2022   Hypokalemia 10/11/2022   Tobacco use disorder 10/11/2022   Acne 09/26/2017   BPH associated with nocturia 07/06/2017   Insomnia 07/06/2017   Alcoholic liver disease (HCC) 07/05/2017   Hypertension 04/13/2017   Alcoholism in remission (HCC) 04/12/2017   History of adenomatous polyp of colon 04/12/2017   PCP:  [name]., MD Pharmacy:   [facility] [phone] - [place], [place] - [address] [place] [address] Phone: [phone] Fax: [phone]  [id number] - [place], [place] - [address] [place] [address] Phone: [phone] Fax: [phone]     Social Determinants of Health (SDOH) Social History: SDOH Screenings   Tobacco Use: High Risk (10/13/2022)   SDOH Interventions:  Readmission Risk Interventions     No data to display

## 2022-10-13 NOTE — Progress Notes (Addendum)
Subjective: 1 Day Post-Op Procedure(s) (LRB): INTRAMEDULLARY (IM) NAIL INTERTROCHANTERIC (Right) Patient reports pain as mild.  Patient has sitter at bedside.  Apparently he pulled out his Foley catheter this morning and his IV today.  Taking by mouth and voiding okay.  Was up to chair this afternoon.  Objective: Vital signs in last 24 hours: Temp:  [97.9 F (36.6 C)] 97.9 F (36.6 C) (06/05 1531) Pulse Rate:  [76-89] 89 (06/05 1531) Resp:  [18-19] 19 (06/05 1531) BP: (110-113)/(71-82) 110/71 (06/05 1531) SpO2:  [100 %] 100 % (06/05 0725)  Intake/Output from previous day: 06/04 0701 - 06/05 0700 In: 1400 [I.V.:1300; IV Piggyback:100] Out: 2525 [Urine:2450; Blood:75] Intake/Output this shift: Total I/O In: 240 [P.O.:240] Out: -   Recent Labs    10/11/22 1030 10/12/22 0300 10/13/22 0248  HGB 14.0 13.5 12.2*   Recent Labs    10/12/22 0300 10/13/22 0248  WBC 8.3 7.1  RBC 4.07* 3.57*  HCT 38.2* 34.8*  PLT 174 164   Recent Labs    10/12/22 0300 10/13/22 0248  NA 134* 137  K 3.4* 3.6  CL 102 102  CO2 23 25  BUN 12 9  CREATININE 0.71 0.74  GLUCOSE 111* 105*  CALCIUM 8.5* 8.3*   No results for input(s): "LABPT", "INR" in the last 72 hours. Right hip exam: Neurovascular intact Sensation intact distally Intact pulses distally Dorsiflexion/Plantar flexion intact Incision: dressing C/D/I No cellulitis present Compartment soft   Assessment/Plan: 1 Day Post-Op Procedure(s) (LRB): INTRAMEDULLARY (IM) NAIL INTERTROCHANTERIC (Right) Plan: Up with therapy Discharge to SNF when medically stable and facility arranged.  It would probably be difficult for his family to manage him at home although certainly that is not out of the question?  More than likely SNF is the best most appropriate management for him. Weight-bear as tolerated on right lower extremity.  Aspirin enteric-coated 325 mg 1 daily for DVT prophylaxis along with TED hose.  Would have him use the aspirin x  1 month postop for DVT prophylaxis. I will leave Rx for tramadol 50 mg as needed pain in his chart.  Would minimize narcotics usage due to dementia.  Can use Tylenol for pain and Rx for tramadol which has been written for him. Will need follow-up with Dr. Luiz Blare in 2 weeks in the office.    Matthew Folks 10/13/2022, 4:57 PM

## 2022-10-13 NOTE — Anesthesia Procedure Notes (Signed)
Spinal  Patient location during procedure: OR Start time: 10/12/2022 12:45 PM End time: 10/12/2022 12:47 PM Reason for block: surgical anesthesia Staffing Performed: anesthesiologist  Anesthesiologist: Elmer Picker, MD Performed by: Elmer Picker, MD Authorized by: Elmer Picker, MD   Preanesthetic Checklist Completed: patient identified, IV checked, risks and benefits discussed, surgical consent, monitors and equipment checked, pre-op evaluation and timeout performed Spinal Block Patient position: right lateral decubitus Prep: DuraPrep and site prepped and draped Patient monitoring: cardiac monitor, continuous pulse ox and blood pressure Approach: midline Location: L3-4 Injection technique: single-shot Needle Needle type: Pencan  Needle gauge: 24 G Needle length: 9 cm Assessment Sensory level: T6 Events: CSF return Additional Notes Functioning IV was confirmed and monitors were applied. Sterile prep and drape, including hand hygiene and sterile gloves were used. The patient was positioned and the spine was prepped. The skin was anesthetized with lidocaine.  Free flow of clear CSF was obtained prior to injecting local anesthetic into the CSF.  The spinal needle aspirated freely following injection.  The needle was carefully withdrawn.  The patient tolerated the procedure well.

## 2022-10-13 NOTE — Evaluation (Signed)
Physical Therapy Evaluation Patient Details Name: Tanner Bond MRN: [id number] DOB: March 18, 1956 Today's Date: 10/13/2022  History of Present Illness  67 year old married male, lives with his spouse, ambulates slowly but without assistance,  brought in by ambulance to [facility] ED on 10/11/2022 following unwitnessed fall at home on 10/11/2022 at approximately 6:30 AM.  Admitted for right hip fracture/minimally displaced intertrochanteric hip fracture.  Orthopedics performed surgical fixation 6/4, WBAT; medical history significant for advanced dementia with behavioral changes, s/p appendectomy, right ankle fracture treated nonsurgically, alcohol use disorder in remission, tobacco use, (spouse denies documented history of HTN),  Clinical Impression   Pt admitted with above diagnosis. Comes from home, where he lives with his wife, who works days; Prior to admission, pt was able to walk independently, depends on his wife for assist as needed with ADLs; Presents to PT with functional dependencies, R hip pain with standing and weight bearing which limits transfers and amb; Needs mod assist to rise to stand and take small pivot steps bed to chair with RW; Pt did participate well and follow all commands related to mobility well, he has solid physical rehab potential, and recommend post-acute rehab to maximize independence and safety with mobility;  Pt currently with functional limitations due to the deficits listed below (see PT Problem List). Pt will benefit from skilled PT to increase their independence and safety with mobility to allow discharge to the venue listed below.          Recommendations for follow up therapy are one component of a multi-disciplinary discharge planning process, led by the attending physician.  Recommendations may be updated based on patient status, additional functional criteria and insurance authorization.  Follow Up Recommendations Can patient physically be transported by  private vehicle: No     Assistance Recommended at Discharge    Patient can return home with the following       Equipment Recommendations Rolling walker (2 wheels);BSC/3in1  Recommendations for Other Services       Functional Status Assessment Patient has had a recent decline in their functional status and demonstrates the ability to make significant improvements in function in a reasonable and predictable amount of time.     Precautions / Restrictions Precautions Precautions: Fall Precaution Comments: also delirium Restrictions RLE Weight Bearing: Weight bearing as tolerated      Mobility  Bed Mobility Overal bed mobility: Needs Assistance Bed Mobility: Supine to Sit     Supine to sit: Mod assist     General bed mobility comments: Light mod assist to help move RLE towards EOB and handheld assist to pull to sit    Transfers Overall transfer level: Needs assistance Equipment used: Rolling walker (2 wheels) Transfers: Sit to/from Stand, Bed to chair/wheelchair/BSC Sit to Stand: Mod assist, +2 safety/equipment   Step pivot transfers: Mod assist, +2 safety/equipment       General transfer comment: Stands from EOB and BSC with light mod assist; Very good effort with taking pivot steps bed to [facility] and tehn BSC to recliner; difficulty with RLE stance due to pain, but with Cues to bear down on RW and unweigh RLE, he is able to step  LLE smooothly; this isn't quite consistent enough, and today, had to move [facility] form behind him an dbring the recliner to him    Ambulation/Gait                  Art therapist  Mobility    Modified Rankin (Stroke Patients Only)       Balance Overall balance assessment: Needs assistance   Sitting balance-Leahy Scale: Fair       Standing balance-Leahy Scale: Poor                               Pertinent Vitals/Pain Pain Assessment Pain Assessment: Faces Faces Pain Scale: Hurts even  more Pain Location: R hip with weight bearing Pain Descriptors / Indicators: Sore, Grimacing Pain Intervention(s): Monitored during session, Premedicated before session, Repositioned    Home Living Family/patient expects to be discharged to:: Private residence Living Arrangements: Spouse/significant other Available Help at Discharge: Family;Available PRN/intermittently Type of Home: House Home Access: Stairs to enter Entrance Stairs-Rails: Left Entrance Stairs-Number of Steps: 3   Home Layout: One level Home Equipment: None      Prior Function Prior Level of Function : Needs assist  Cognitive Assist : Mobility (cognitive);ADLs (cognitive)           Mobility Comments: Typically walks without an assistive device ADLs Comments: Wife assist as needed     Hand Dominance        Extremity/Trunk Assessment   Upper Extremity Assessment Upper Extremity Assessment: Overall WFL for tasks assessed    Lower Extremity Assessment Lower Extremity Assessment: RLE deficits/detail RLE Deficits / Details: Decr AROM and strength, limited by pain post fx and surgical repair; more pain with weigth bearing       Communication   Communication: No difficulties  Cognition Arousal/Alertness: Awake/alert Behavior During Therapy: WFL for tasks assessed/performed Overall Cognitive Status: History of cognitive impairments - at baseline                                 General Comments: Pleasantly confused; enjoys talking about his experinece in te[employer] Comments General comments (skin integrity, edema, etc.): Wife present and supportive    Exercises     Assessment/Plan    PT Assessment Patient needs continued PT services  PT Problem List Decreased strength;Decreased range of motion;Decreased activity tolerance;Decreased balance;Decreased mobility;Decreased coordination;Decreased cognition;Decreased knowledge of use of DME;Decreased safety awareness        PT Treatment Interventions DME instruction;Gait training;Stair training;Functional mobility training;Therapeutic activities;Therapeutic exercise;Balance training;Patient/family education    PT Goals (Current goals can be found in the Care Plan section)  Acute Rehab PT Goals Patient Stated Goal: did not state, but agrees to getting OOB PT Goal Formulation: With patient/family Time For Goal Achievement: 10/27/22 Potential to Achieve Goals: Good    Frequency Min 4X/week     Co-evaluation               AM-PAC PT "6 Clicks" Mobility  Outcome Measure Help needed turning from your back to your side while in a flat bed without using bedrails?: A Lot Help needed moving from lying on your back to sitting on the side of a flat bed without using bedrails?: A Lot Help needed moving to and from a bed to a chair (including a wheelchair)?: A Lot Help needed standing up from a chair using your arms (e.g., wheelchair or bedside chair)?: A Lot Help needed to walk in hospital room?: Total Help needed climbing 3-5 steps with a railing? : Total 6 Click Score: 10    End of Session Equipment Utilized During Treatment: Gait  belt Activity Tolerance: Patient tolerated treatment well Patient left: in chair;with call bell/phone within reach;with chair alarm set;with family/visitor present Nurse Communication: Mobility status;Other (comment) (OPting to leave mitts off for now) PT Visit Diagnosis: Other abnormalities of gait and mobility (R26.89);Pain Pain - Right/Left: Right Pain - part of body: Hip    Time: 2956-2130 PT Time Calculation (min) (ACUTE ONLY): 38 min   Charges:   PT Evaluation $PT Eval Moderate Complexity: 1 Mod PT Treatments $Therapeutic Activity: 23-37 mins        Van Clines, PT  Acute Rehabilitation Services Office [phone] Secure Chat welcomed   Levi Aland 10/13/2022, 5:19 PM

## 2022-10-13 NOTE — Progress Notes (Signed)
PROGRESS NOTE    Tanner Bond  ZOX:[id number] DOB: [birth year] DOA: 10/11/2022 PCP: [name]., MD   Brief Narrative:  This 67 year old male, lives with his spouse, ambulates slowly but without assistance, medical history significant for advanced dementia with behavioral changes, s/p appendectomy, right ankle fracture treated nonsurgically, alcohol use disorder in remission, tobacco use, (spouse denies documented history of HTN), brought in by ambulance to [facility] ED on 10/11/2022 following unwitnessed fall at home on 10/11/2022 at approximately 6:30 AM.  Admitted for right hip fracture/minimally displaced intertrochanteric hip fracture.  Orthopedics consulted and planning surgical fixation 6/4.   Assessment & Plan:   Principal Problem:   Closed right hip fracture (HCC) Active Problems:   Alcoholism in remission (HCC)   Hypertension   Intraventricular hemorrhage (HCC)   Dementia with behavioral disturbance (HCC)   Hypokalemia   Tobacco use disorder  Right hip fracture closed, acute: He presented  s/p mechanical fall at home on 10/11/2022 Xray right hip > Nondisplaced fracture is possible. CT of the right hip: Appears to have a complex right hip fracture.   As per orthopedics, this is a minimally displaced intertrochanteric hip fracture. Orthopedics/Dr. Luiz Blare has evaluated the patient and planning surgical fixation Continue Multimodality pain control, initiated scheduled Tylenol 1 g 3 times daily.   Minimize opioids as much as possible.  Pain appears to be controlled. No reported cardiac, respiratory history and patient is optimized to proceed with indicated surgery with anticipated risks. -Check vitamin D levels.   Intraventricular hemorrhage: CT head 10/11/2022: Tiny amount of intraventricular hemorrhage within the left occipital horn.  No other acute abnormality identified.  Likely related to fall and head trauma. Patient is neurologically intact, no focal deficits  noted. As per EDP discussion with Dr. Wynetta Emery /Neurosurgery, recommends repeating head CT in 12 to 24 hours.  Follow-up CT head 6/4: No progression of trace hemorrhage at the left lateral ventricle. Pronounced brain atrophy. Avoiding chemical DVT prophylaxis.  However will need to check with neurosurgery regarding timing of starting postop chemical DVT prophylaxis.  If mental status deteriorates, may need to repeat head CT stat.   S/p mechanical fall 10/11/2022: No syncope as per spouse's report PT and OT evaluation postsurgery and likely SNF for STR   Hypokalemia: Replaced.  Continue to monitor   Advanced dementia with behavioral changes: - Per spouse, etiology of dementia is unclear. - High risk for hospital delirium.  Delirium precautions - Continue prior home dose of Aricept, Namenda, buspirone, Zoloft and Seroquel. - If has agitation, may need safety sitter and as needed Haldol or Seroquel or Zyprexa.   Tobacco abuse: - Prior history of significant cigarette smoking but lately infrequent use of cigars.   Hypertension: Not on any antihypertensive medications.  BP controlled. Continue to monitor blood pressure   Alcohol use disorder in remission: - History as noted above.  Monitor.   Vitamin D deficiency: 25-hydroxy vitamin D levels 15.54 ng/ml.  Start vitamin D 50,000 IU  weekly.    Body mass index is 22.57 kg/m.   DVT prophylaxis: SCDs Code Status: DNR Family Communication: No family at bed side. Disposition Plan:    Status is: Inpatient Remains inpatient appropriate because: Awaiting right hip surgery    Consultants:  Orthopaedics  Procedures: CT head.  Antimicrobials:  Anti-infectives (From admission, onward)    Start     Dose/Rate Route Frequency Ordered Stop   10/12/22 1700  ceFAZolin (ANCEF) IVPB 2g/100 mL premix  2 g 200 mL/hr over 30 Minutes Intravenous Every 6 hours 10/12/22 1600 10/13/22 0031   10/12/22 1045  ceFAZolin (ANCEF) IVPB 2g/100 mL  premix        2 g 200 mL/hr over 30 Minutes Intravenous On call to O.R. 10/12/22 1044 10/12/22 1255   10/12/22 1040  ceFAZolin (ANCEF) 2-4 GM/100ML-% IVPB       Note to Pharmacy: Darrick Huntsman: cabinet override      10/12/22 1040 10/12/22 1305      Subjective: Patient was seen and examined at bedside. Overnight events noted.  Patient reports doing better. He reports having pain in the right hip area.  States he is not going to have surgery.  Objective: Vitals:   10/12/22 1445 10/12/22 1500 10/12/22 1530 10/13/22 0725  BP: (!) 93/59 104/61 100/66 113/82  Pulse: 69 75 68 76  Resp: 11 13 16 18   Temp:  97.9 F (36.6 C) 97.6 F (36.4 C) 97.9 F (36.6 C)  TempSrc:   Oral   SpO2: 98% 95% 100% 100%  Weight:      Height:        Intake/Output Summary (Last 24 hours) at 10/13/2022 1441 Last data filed at 10/13/2022 0700 Gross per 24 hour  Intake --  Output 2000 ml  Net -2000 ml   Filed Weights   10/11/22 1012 10/12/22 1043  Weight: 75.5 kg 75.5 kg    Examination:  General exam: Appears calm and comfortable, deconditioned, not in any acute distress. Respiratory system: CTA bilaterally. Respiratory effort normal.RR 15 Cardiovascular system: S1 & S2 heard, RRR. No JVD, murmurs, rubs, gallops or clicks. No pedal edema. Gastrointestinal system: Abdomen is soft non tender, non distended, bowel sounds present Central nervous system: Alert and oriented x 3. No focal neurological deficits. Extremities: Restricted movements right lower extremity.  Right lower extremity externally rotated Skin: No rashes, lesions or ulcers Psychiatry: Judgement and insight appear normal. Mood & affect appropriate.     Data Reviewed: I have personally reviewed following labs and imaging studies  CBC: Recent Labs  Lab 10/11/22 1030 10/12/22 0300 10/13/22 0248  WBC 13.4* 8.3 7.1  HGB 14.0 13.5 12.2*  HCT 41.3 38.2* 34.8*  MCV 98.3 93.9 97.5  PLT 178 174 164   Basic Metabolic  Panel: Recent Labs  Lab 10/11/22 1030 10/12/22 0300 10/13/22 0248  NA 134* 134* 137  K 3.4* 3.4* 3.6  CL 102 102 102  CO2 22 23 25   GLUCOSE 126* 111* 105*  BUN 11 12 9   CREATININE 0.81 0.71 0.74  CALCIUM 8.8* 8.5* 8.3*  MG  --  2.1  --    GFR: Estimated Creatinine Clearance: 94.4 mL/min (by C-G formula based on SCr of 0.74 mg/dL). Liver Function Tests: No results for input(s): "AST", "ALT", "ALKPHOS", "BILITOT", "PROT", "ALBUMIN" in the last 168 hours. No results for input(s): "LIPASE", "AMYLASE" in the last 168 hours. No results for input(s): "AMMONIA" in the last 168 hours. Coagulation Profile: No results for input(s): "INR", "PROTIME" in the last 168 hours. Cardiac Enzymes: No results for input(s): "CKTOTAL", "CKMB", "CKMBINDEX", "TROPONINI" in the last 168 hours. BNP (last 3 results) No results for input(s): "PROBNP" in the last 8760 hours. HbA1C: No results for input(s): "HGBA1C" in the last 72 hours. CBG: Recent Labs  Lab 10/12/22 0734  GLUCAP 118*   Lipid Profile: No results for input(s): "CHOL", "HDL", "LDLCALC", "TRIG", "CHOLHDL", "LDLDIRECT" in the last 72 hours. Thyroid Function Tests: No results for input(s): "TSH", "T4TOTAL", "FREET4", "T3FREE", "  THYROIDAB" in the last 72 hours. Anemia Panel: No results for input(s): "VITAMINB12", "FOLATE", "FERRITIN", "TIBC", "IRON", "RETICCTPCT" in the last 72 hours. Sepsis Labs: No results for input(s): "PROCALCITON", "LATICACIDVEN" in the last 168 hours.  Recent Results (from the past 240 hour(s))  Surgical pcr screen     Status: Abnormal   Collection Time: 10/12/22  3:25 AM   Specimen: Nasal Mucosa; Nasal Swab  Result Value Ref Range Status   MRSA, PCR NEGATIVE NEGATIVE Final   Staphylococcus aureus POSITIVE (A) NEGATIVE Final    Comment: (NOTE) The Xpert SA Assay (FDA approved for NASAL specimens in patients 90 years of age and older), is one component of a comprehensive surveillance program. It is not  intended to diagnose infection nor to guide or monitor treatment. Performed at [facility], [address] [place], [place] [address]     Radiology Studies: DG FEMUR, [place] 2 VIEWS RIGHT  Result Date: 10/12/2022 CLINICAL DATA:  Right intramedullary rod fixation. EXAM: RIGHT FEMUR 2 VIEWS; DG C-ARM 1-60 MIN-NO REPORT COMPARISON:  Right hip radiographs-10/11/2022 FINDINGS: Four spot intraoperative fluoroscopic images of the right hip and femur are provided for review and demonstrate the sequela of intramedullary rod fixation of the right femur and dynamic screw fixation of the right femoral neck. The distal end of the femoral rod is transfixed with a single cancellous screw. Near anatomic alignment of known right-sided intertrochanteric femur fracture. No evidence of failure hardware failure or loosening. Adjacent skin staples.  No radiopaque foreign body. IMPRESSION: Post intramedullary rod fixation of the right femur and dynamic screw fixation of the right femoral neck without evidence of complication. Electronically Signed   By: Simonne Come M.D.   On: 10/12/2022 15:52   DG C-Arm 1-60 Min-No Report  Result Date: 10/12/2022 Fluoroscopy was utilized by the requesting physician.  No radiographic interpretation.   CT HEAD WO CONTRAST ( )  Result Date: 10/12/2022 CLINICAL DATA:  Headache, sudden and severe.  Altered mental status. EXAM: CT HEAD WITHOUT CONTRAST TECHNIQUE: Contiguous axial images were obtained from the base of the skull through the vertex without intravenous contrast. RADIATION DOSE REDUCTION: This exam was performed according to the departmental dose-optimization program which includes automated exposure control, adjustment of the mA and/or kV according to patient size and/or use of iterative reconstruction technique. COMPARISON:  Head CT from yesterday FINDINGS: Brain: Trace high-density hemorrhage at the occipital horn of the left lateral ventricle. No interval progression.  Advanced brain atrophy with ventriculomegaly. No evidence of acute infarct, tract of hydrocephalus, mass, or collection. Vascular: No hyperdense vessel or unexpected calcification. Skull: Normal. Negative for fracture or focal lesion. Sinuses/Orbits: No acute finding. IMPRESSION: No progression of trace hemorrhage at the left lateral ventricle. Pronounced brain atrophy. Electronically Signed   By: Tiburcio Pea M.D.   On: 10/12/2022 06:33    Scheduled Meds:  [START ON 10/14/2022] acetaminophen  1,000 mg Oral TID   acetaminophen  500 mg Oral Q6H   aspirin EC  325 mg Oral Q breakfast   busPIRone  10 mg Oral TID   Chlorhexidine Gluconate Cloth  6 each Topical Q0600   cholecalciferol  1,000 Units Oral Daily   docusate sodium  100 mg Oral BID   donepezil  10 mg Oral QHS   memantine  5 mg Oral BID   mupirocin ointment  1 Application Nasal BID   QUEtiapine  25 mg Oral QHS   sertraline  100 mg Oral Daily   Vitamin D (Ergocalciferol)  50,000 Units Oral Q7 days   Continuous Infusions:  methocarbamol (ROBAXIN) IV       LOS: 2 days    Time spent: 50 mins    Willeen Niece, MD [facility]   If 7PM-7AM, please contact night-coverage

## 2022-10-14 DIAGNOSIS — S72001D Fracture of unspecified part of neck of right femur, subsequent encounter for closed fracture with routine healing: Secondary | ICD-10-CM | POA: Diagnosis not present

## 2022-10-14 LAB — BASIC METABOLIC PANEL
Anion gap: 8 (ref 5–15)
BUN: 14 mg/dL (ref 8–23)
CO2: 24 mmol/L (ref 22–32)
Calcium: 8.7 mg/dL — ABNORMAL LOW (ref 8.9–10.3)
Chloride: 102 mmol/L (ref 98–111)
Creatinine, Ser: 0.77 mg/dL (ref 0.61–1.24)
GFR, Estimated: >60 mL/min (ref >60)
Glucose, Bld: 105 mg/dL — ABNORMAL HIGH (ref 70–99)
Potassium: 3.7 mmol/L (ref 3.5–5.1)
Sodium: 134 mmol/L — ABNORMAL LOW (ref 135–145)

## 2022-10-14 LAB — CBC
HCT: 37.3 % — ABNORMAL LOW (ref 39.0–52.0)
Hemoglobin: 13.2 g/dL (ref 13.0–17.0)
MCH: 33.5 pg (ref 26.0–34.0)
MCHC: 35.4 g/dL (ref 30.0–36.0)
MCV: 94.7 fL (ref 80.0–100.0)
Platelets: 240 10*3/uL (ref 150–400)
RBC: 3.94 MIL/uL — ABNORMAL LOW (ref 4.22–5.81)
RDW: 12.1 % (ref 11.5–15.5)
WBC: 7.7 10*3/uL (ref 4.0–10.5)
nRBC: 0 % (ref 0.0–0.2)

## 2022-10-14 NOTE — Progress Notes (Signed)
Physical Therapy Treatment Patient Details Name: Tanner Bond MRN: [id number] DOB: 1956-01-20 Today's Date: 10/14/2022   History of Present Illness 67 year old married male, lives with his spouse, ambulates slowly but without assistance,  brought in by ambulance to [facility] ED on 10/11/2022 following unwitnessed fall at home on 10/11/2022 at approximately 6:30 AM.  Admitted for right hip fracture/minimally displaced intertrochanteric hip fracture.  Orthopedics performed surgical fixation 6/4, WBAT; medical history significant for advanced dementia with behavioral changes, s/p appendectomy, right ankle fracture treated nonsurgically, alcohol use disorder in remission, tobacco use, (spouse denies documented history of HTN),    PT Comments    Patient remains very confused and difficult to re-direct him to desired tasks. He is fixated on needing to go to the airport to pick up his wife and therefore did not want to leave the room when walking ("I don't want to miss my ride.") Attempted to instruct in RLE ROM exercises, however pt reports his hip hurts with these movements and "why would I do that?" Unable to redirect.     Recommendations for follow up therapy are one component of a multi-disciplinary discharge planning process, led by the attending physician.  Recommendations may be updated based on patient status, additional functional criteria and insurance authorization.  Follow Up Recommendations  Can patient physically be transported by private vehicle: No    Assistance Recommended at Discharge    Patient can return home with the following     Equipment Recommendations  Rolling walker (2 wheels);BSC/3in1    Recommendations for Other Services       Precautions / Restrictions Precautions Precautions: Fall Precaution Comments: also delirium Restrictions Weight Bearing Restrictions: No RLE Weight Bearing: Weight bearing as tolerated     Mobility  Bed Mobility Overal bed  mobility: Needs Assistance Bed Mobility: Sit to Supine       Sit to supine: Min assist   General bed mobility comments: sitting on EOB with RN in room on arrival; returned to supine at end of session with min assist to raise RLE onto bed and max cues for scooting laterally to middle of bed    Transfers Overall transfer level: Needs assistance Equipment used: Rolling walker (2 wheels) Transfers: Sit to/from Stand Sit to Stand: Min assist           General transfer comment: from EOB; boosting assist    Ambulation/Gait Ambulation/Gait assistance: Min guard Gait Distance (Feet): 25 Feet Assistive device: Rolling walker (2 wheels) Gait Pattern/deviations: Step-to pattern, Decreased stride length   Gait velocity interpretation: <1.8 ft/sec, indicate of risk for recurrent falls   General Gait Details: pt able to maneuver RW without assistance with running into object (lightly) on his left x 1; denies hip pain and unable to follow instructions for sequencing to minimize Rt hip pain   Stairs             Wheelchair Mobility    Modified Rankin (Stroke Patients Only)       Balance Overall balance assessment: Needs assistance   Sitting balance-Leahy Scale: Fair       Standing balance-Leahy Scale: Poor                              Cognition Arousal/Alertness: Awake/alert Behavior During Therapy: WFL for tasks assessed/performed Overall Cognitive Status: History of cognitive impairments - at baseline  General Comments: Pleasantly confused; enjoys talking about his experinece in [employer]        Exercises Other Exercises Other Exercises: Attempted LAQ, seated hip flexion and  pt then reports it hurts his hip and will not proceed with exercises with RLE. Did complete several reps of each with LLE "see, this side doesn't hurt"    General Comments General comments (skin integrity, edema, etc.): Thinks he is  needed at the airport to pick up his wife.      Pertinent Vitals/Pain Pain Assessment Pain Assessment: No/denies pain Faces Pain Scale: No hurt    Home Living                          Prior Function            PT Goals (current goals can now be found in the care plan section) Acute Rehab PT Goals Patient Stated Goal: did not state, but agrees to getting OOB Time For Goal Achievement: 10/27/22 Potential to Achieve Goals: Good Progress towards PT goals: Progressing toward goals    Frequency    Min 4X/week      PT Plan Current plan remains appropriate    Co-evaluation              AM-PAC PT "6 Clicks" Mobility   Outcome Measure  Help needed turning from your back to your side while in a flat bed without using bedrails?: A Lot Help needed moving from lying on your back to sitting on the side of a flat bed without using bedrails?: A Lot Help needed moving to and from a bed to a chair (including a wheelchair)?: A Little Help needed standing up from a chair using your arms (e.g., wheelchair or bedside chair)?: A Little Help needed to walk in hospital room?: A Little Help needed climbing 3-5 steps with a railing? : Total 6 Click Score: 14    End of Session Equipment Utilized During Treatment: Gait belt Activity Tolerance: Patient tolerated treatment well Patient left: with call bell/phone within reach;in bed;with bed alarm set   PT Visit Diagnosis: Other abnormalities of gait and mobility (R26.89);Pain Pain - Right/Left: Right Pain - part of body: Hip     Time: 5784-6962 PT Time Calculation (min) (ACUTE ONLY): 12 min  Charges:  $Gait Training: 8-22 mins                      Jerolyn Center, PT Acute Rehabilitation Services  Office [phone]    Zena Amos 10/14/2022, 3:28 PM

## 2022-10-14 NOTE — Progress Notes (Signed)
Subjective: 2 Days Post-Op Procedure(s) (LRB): INTRAMEDULLARY (IM) NAIL INTERTROCHANTERIC (Right) Patient reports pain as mild.    Objective: Vital signs in last 24 hours: Temp:  [97.9 F (36.6 C)-98.5 F (36.9 C)] 98.2 F (36.8 C) (06/06 0859) Pulse Rate:  [72-89] 72 (06/06 0859) Resp:  [16-19] 17 (06/06 0859) BP: (102-117)/(71-79) 102/79 (06/06 0859) SpO2:  [98 %] 98 % (06/06 0859)  Intake/Output from previous day: 06/05 0701 - 06/06 0700 In: 420 [P.O.:420] Out: 200 [Urine:200] Intake/Output this shift: No intake/output data recorded.  Recent Labs    10/12/22 0300 10/13/22 0248 10/14/22 0959  HGB 13.5 12.2* 13.2   Recent Labs    10/13/22 0248 10/14/22 0959  WBC 7.1 7.7  RBC 3.57* 3.94*  HCT 34.8* 37.3*  PLT 164 240   Recent Labs    10/13/22 0248 10/14/22 0959  NA 137 134*  K 3.6 3.7  CL 102 102  CO2 25 24  BUN 9 14  CREATININE 0.74 0.77  GLUCOSE 105* 105*  CALCIUM 8.3* 8.7*   No results for input(s): "LABPT", "INR" in the last 72 hours.  Neurologically intact ABD soft Neurovascular intact Sensation intact distally Intact pulses distally Dorsiflexion/Plantar flexion intact No cellulitis present Compartment soft   Assessment/Plan: 2 Days Post-Op Procedure(s) (LRB): INTRAMEDULLARY (IM) NAIL INTERTROCHANTERIC (Right) Advance diet Up with therapy WBAT Discharge per internal med service      Harvie Junior 10/14/2022, 1:42 PM

## 2022-10-14 NOTE — Op Note (Signed)
PREOPERATIVE DIAGNOSIS: right hip intertrochanteric fracture 3-part with varus displacement   POSTOPERATIVE DIAGNOSIS: Same   PROCEDURE: Open reduction internal fixation right hip intertrochanteric fracture using a Biomet x 11mm affixus nail , 115 mm lag screw keyed   SURGEON: Hadar Elgersma  ASSISTANT: Gus Puma PA-C  (present throughout entire procedure and necessary for timely completion of the procedure) ANESTHESIA: General   BLOOD LOSS: 150cc   FLUID REPLACEMENT: 500 cc crystalloid   DRAINS: Foley Catheter   URINE OUTPUT: 300cc   COMPLICATIONS: none   INDICATIONS FOR PROCEDURE: right hip intertrochanteric fracture, 3-part, sustained from a fall. Patient. presented to the emergency room, was admitted by the medicine service and orthopedic consultation was obtained. To decrease pain and increase function we have recommended open reduction internal fixation using locked trochanteric nail. The risks, benefits, and alternatives were discussed at length including but not limited to the risks of infection, bleeding, nerve injury, stiffness, blood clots, the need for revision surgery, cardiopulmonary complications, among others, and they were willing to proceed. Benefits have been discussed. Questions answered.   PROCEDURE IN DETAIL: The patient was identified by armband,   received preoperative IV antibiotics in the holding area, taken to the operating room , appropriate anesthetic monitors were attached and general endotracheal anesthesia induced. Pt. was then transferred to a radiolucent hannah and all bony prominences were padded and the left leg was placed in the well-leg holder.,  The right leg was then prepped and draped in the usual sterile fashion.  Under C-arm imaging control we then performed a closed reduction with abduction and internal rotation obtaining an acceptable reduction with the leg in full extension.  A timeout procedure was performed. Under C-arm image control a line was  drawn coaxially with the femur on the lateral view, a stab wound was made 8 cm proximal to the tip of the greater trochanter along the line that had been drawn, and a guide pin was inserted into the tip of the greater trochanter and then down the shaft of the femur pass intertrochanteric fracture. We then placed the ball-tipped guidewire, overreamed with the Biomet pilot reamer and then flexibly reamed up to 13 mm obtaining some early chatter at midshaft. With the ball-tipped guidewire in place we measured for 420 mm x 11 mm Biomet affixes trochanteric nail which was loaded on the inserter placed over the guidewire and down the femur to the appropriate depth that allowed [place] to place the lag bolt into the center of the femoral head, slightly inferior. With the C-arm set for the lateral view we then made it so that the tip of the guide pin bisected the femoral head and inserted the guide pin through the lateral flare of the inner trochanter fracture through the rod and up into the femoral head using the driving platform guide holes. We measured for a 115 mm lag screw and then reamed over the guidewire with the reamer set for 115 mm. We then loaded a 115 mm Biomet affixes lag screw and inserted it through the lateral flare of the femur through the rod and up into the femoral head to a point it was 8 mm below the subchondral bone on the AP and lateral views. At this point the locking screw was inserted and tightened and then backed off a quarter turn to allow compression. We then directed our attention towards the tip of the rod and using the perfect circle technique inserted a 54 mm locking screw in the proximal of  the 2 locking holes distally. Final C-arm images were taken of the construct the hip was taken through flexion and internal and external rotation under image intensification to assure that the lag bolt did not enter the joint. At this point stab wounds and the rod insertion incision were irrigated out normal  saline solution, and the subcutaneous tissue closed with 2-0 undyed Vicryl suture and the skin with staples. A dressing of Mepilex was then applied the patient was unclamped rolled supine awakened extubated and taken to the recovery room without difficulty.  Of note Rosanne Ashing both and was present for their entire case and assisted by retraction of tissues, manipulation of the leg, close and minimize over time

## 2022-10-14 NOTE — [place] FL2 (Signed)
[place] MEDICAID FL2 LEVEL OF CARE FORM     IDENTIFICATION  Patient Name: Tanner Bond Birthdate: [birth year] Sex: male Admission Date (Current Location): 10/11/2022  [facility] and [place] Number:  Producer, television/film/video and Address:  [facility], [address] [place], [place] [address]      Provider Number: [id number]  Attending Physician Name and Address:  Willeen Niece, MD  Relative Name and Phone Number:  [facility] Spouse [phone]    Current Level of Care: Hospital Recommended Level of Care: Skilled Nursing Facility Prior Approval Number:    Date Approved/Denied:   PASRR Number: [id number]  Discharge Plan: SNF    Current Diagnoses: Patient Active Problem List   Diagnosis Date Noted   Closed right hip fracture (HCC) 10/11/2022   Intraventricular hemorrhage (HCC) 10/11/2022   Dementia with behavioral disturbance (HCC) 10/11/2022   Hypokalemia 10/11/2022   Tobacco use disorder 10/11/2022   Acne 09/26/2017   BPH associated with nocturia 07/06/2017   Insomnia 07/06/2017   Alcoholic liver disease (HCC) 07/05/2017   Hypertension 04/13/2017   Alcoholism in remission (HCC) 04/12/2017   History of adenomatous polyp of colon 04/12/2017    Orientation RESPIRATION BLADDER Height & Weight     Self  Normal Incontinent, External catheter Weight: 166 lb 7.2 oz (75.5 kg) Height:  6' (182.9 cm)  BEHAVIORAL SYMPTOMS/MOOD NEUROLOGICAL BOWEL NUTRITION STATUS      Incontinent Diet (see discharge summary)  AMBULATORY STATUS COMMUNICATION OF NEEDS Skin   Total Care Verbally Surgical wounds                       Personal Care Assistance Level of Assistance  Bathing, Feeding, Dressing Bathing Assistance: Maximum assistance Feeding assistance: Limited assistance Dressing Assistance: Maximum assistance     Functional Limitations Info  Sight, Hearing, Speech Sight Info: Adequate Hearing Info: Impaired Speech Info: Adequate     SPECIAL CARE FACTORS FREQUENCY  PT (By licensed PT), OT (By licensed OT)     PT Frequency: 5x week OT Frequency: 5x week            Contractures Contractures Info: Not present    Additional Factors Info  Code Status, Allergies Code Status Info: DNR Allergies Info: NKA           Current Medications (10/14/2022):  This is the current hospital active medication list Current Facility-Administered Medications  Medication Dose Route Frequency Provider Last Rate Last Admin   acetaminophen (TYLENOL) tablet 1,000 mg  1,000 mg Oral TID Mosetta Anis, RPH   1,000 mg at 10/14/22 8469   acetaminophen (TYLENOL) tablet 325-650 mg  325-650 mg Oral Q6H PRN Marshia Ly, PA-C       aspirin EC tablet 325 mg  325 mg Oral Q breakfast Marshia Ly, PA-C   325 mg at 10/14/22 6295   bisacodyl (DULCOLAX) EC tablet 5 mg  5 mg Oral Daily PRN Marshia Ly, PA-C       busPIRone (BUSPAR) tablet 10 mg  10 mg Oral TID Marshia Ly, PA-C   10 mg at 10/14/22 2841   Chlorhexidine Gluconate Cloth 2 % PADS 6 each  6 each Topical Q0600 Marshia Ly, PA-C   6 each at 10/14/22 0557   cholecalciferol (VITAMIN D3) 25 MCG (1000 UNIT) tablet 1,000 Units  1,000 Units Oral Daily Marshia Ly, PA-C   1,000 Units at 10/14/22 3244   docusate sodium (COLACE) capsule 100 mg  100 mg Oral BID Marshia Ly,  PA-C   100 mg at 10/14/22 0835   donepezil (ARICEPT) tablet 10 mg  10 mg Oral QHS Marshia Ly, PA-C   10 mg at 10/13/22 2212   HYDROcodone-acetaminophen (NORCO/VICODIN) 5-325 MG per tablet 1 tablet  1 tablet Oral Q4H PRN Marshia Ly, PA-C   1 tablet at 10/12/22 2042   memantine (NAMENDA) tablet 5 mg  5 mg Oral BID Marshia Ly, PA-C   5 mg at 10/14/22 5784   methocarbamol (ROBAXIN) tablet 500 mg  500 mg Oral Q6H PRN Marshia Ly, PA-C   500 mg at 10/12/22 2043   Or   methocarbamol (ROBAXIN) 500 mg in dextrose 5 % 50 mL IVPB  500 mg Intravenous Q6H PRN Marshia Ly, PA-C       morphine (PF) 2  MG/ML injection 0.5-1 mg  0.5-1 mg Intravenous Q4H PRN Marshia Ly, PA-C       mupirocin ointment (BACTROBAN) 2 % 1 Application  1 Application Nasal BID Marshia Ly, PA-C   1 Application at 10/14/22 0837   ondansetron (ZOFRAN) tablet 4 mg  4 mg Oral Q6H PRN Marshia Ly, PA-C       Or   ondansetron [facility]) injection 4 mg  4 mg Intravenous Q6H PRN Marshia Ly, PA-C       polyethylene glycol (MIRALAX / GLYCOLAX) packet 17 g  17 g Oral Daily PRN Marshia Ly, PA-C       QUEtiapine (SEROQUEL) tablet 25 mg  25 mg Oral QHS Marshia Ly, PA-C   25 mg at 10/13/22 2215   sertraline (ZOLOFT) tablet 100 mg  100 mg Oral Daily Marshia Ly, PA-C   100 mg at 10/14/22 6962   Vitamin D (Ergocalciferol) (DRISDOL) 1.25 MG (50000 UNIT) capsule 50,000 Units  50,000 Units Oral Q7 days Willeen Niece, MD   50,000 Units at 10/13/22 1645     Discharge Medications: Please see discharge summary for a list of discharge medications.  Relevant Imaging Results:  Relevant Lab Results:   Additional Information SSN: [id number]  Lorri Frederick, LCSW

## 2022-10-14 NOTE — Plan of Care (Signed)
  Problem: Education: Goal: Knowledge of General Education information will improve Description: Including pain rating scale, medication(s)/side effects and non-pharmacologic comfort measures Outcome: Progressing   Problem: Health Behavior/Discharge Planning: Goal: Ability to manage health-related needs will improve Outcome: Progressing   Problem: Clinical Measurements: Goal: Ability to maintain clinical measurements within normal limits will improve Outcome: Progressing Goal: Will remain free from infection Outcome: Progressing Goal: Diagnostic test results will improve Outcome: Progressing Goal: Respiratory complications will improve Outcome: Progressing Goal: Cardiovascular complication will be avoided Outcome: Progressing   Problem: Activity: Goal: Risk for activity intolerance will decrease Outcome: Progressing   Problem: Nutrition: Goal: Adequate nutrition will be maintained Outcome: Progressing   Problem: Coping: Goal: Level of anxiety will decrease Outcome: Progressing   Problem: Elimination: Goal: Will not experience complications related to bowel motility Outcome: Progressing Goal: Will not experience complications related to urinary retention Outcome: Progressing   Problem: Pain Managment: Goal: General experience of comfort will improve Outcome: Progressing   Problem: Safety: Goal: Ability to remain free from injury will improve Outcome: Progressing   Problem: Skin Integrity: Goal: Risk for impaired skin integrity will decrease Outcome: Progressing   Problem: Education: Goal: Verbalization of understanding the information provided (i.e., activity precautions, restrictions, etc) will improve Outcome: Progressing Goal: Individualized Educational Video(s) Outcome: Progressing   Problem: Activity: Goal: Ability to ambulate and perform ADLs will improve Outcome: Progressing   Problem: Clinical Measurements: Goal: Postoperative complications will be  avoided or minimized Outcome: Progressing   Problem: Self-Concept: Goal: Ability to maintain and perform role responsibilities to the fullest extent possible will improve Outcome: Progressing   Problem: Pain Management: Goal: Pain level will decrease Outcome: Progressing   Problem: Safety: Goal: Non-violent Restraint(s) Outcome: Progressing   

## 2022-10-14 NOTE — TOC Progression Note (Addendum)
Transition of Care [facility]) - Progression Note    Patient Details  Name: Tanner Bond MRN: [id number] Date of Birth: 02-Oct-1955  Transition of Care [facility]) CM/SW Contact  Lorri Frederick, LCSW Phone Number: 10/14/2022, 10:01 AM  Clinical Narrative:   PT recommendation now in, referral sent out for SNF.  1500: Bed offers provided to wife over the phone.  She is requesting response from [facility], CSW reached out to that facility.     Expected Discharge Plan: Skilled Nursing Facility Barriers to Discharge: Continued Medical Work up, SNF Pending bed offer  Expected Discharge Plan and Services In-house Referral: Clinical Social Work   Post Acute Care Choice:  (awaiting PT recs) Living arrangements for the past 2 months: Single Family Home                                       Social Determinants of Health (SDOH) Interventions SDOH Screenings   Tobacco Use: High Risk (10/13/2022)    Readmission Risk Interventions     No data to display

## 2022-10-14 NOTE — Progress Notes (Signed)
PROGRESS NOTE    Tanner Bond  NWG:[id number] DOB: 21-Aug-1955 DOA: 10/11/2022 PCP: [name]., MD   Brief Narrative:  This 67 year old male, lives with his spouse, ambulates slowly but without assistance, medical history significant for advanced dementia with behavioral changes, s/p appendectomy, right ankle fracture treated nonsurgically, alcohol use disorder in remission, tobacco use, (spouse denies documented history of HTN), brought in by ambulance to [facility] ED on 10/11/2022 following unwitnessed fall at home on 10/11/2022 at approximately 6:30 AM.  Admitted for right hip fracture/minimally displaced intertrochanteric hip fracture.  Orthopedics consulted and He underwent ORIF on 10/13/22.   Assessment & Plan:   Principal Problem:   Closed right hip fracture (HCC) Active Problems:   Alcoholism in remission (HCC)   Hypertension   Intraventricular hemorrhage (HCC)   Dementia with behavioral disturbance (HCC)   Hypokalemia   Tobacco use disorder  Right hip fracture closed, acute: He presented  s/p mechanical fall at home on 10/11/2022. Xray right hip > Nondisplaced fracture is possible. CT of the right hip: Appears to have a complex right hip fracture.   As per orthopedics, this is a minimally displaced intertrochanteric hip fracture. Orthopedics/Dr. Luiz Blare has evaluated the patient and planning surgical fixation Continue Multimodality pain control, initiated scheduled Tylenol 1 g 3 times daily.   Minimize opioids as much as possible.  Pain appears to be controlled. No reported cardiac, respiratory history and patient is optimized to proceed with indicated surgery with anticipated risks. Patient underwent successful ORIF on 10/13/2022. PT and OT recommended SNF.  Aspirin 325 mg daily for 1 month postoperative for DVT prophylaxis.   Intraventricular hemorrhage: CT head 10/11/2022: Tiny amount of intraventricular hemorrhage within the left occipital horn.  No other acute  abnormality identified.  Likely related to fall and head trauma. Patient is neurologically intact, no focal deficits noted. As per EDP discussion with Dr. Wynetta Emery /Neurosurgery, recommends repeating head CT in 12 to 24 hours.  Follow-up CT head 6/4: No progression of trace hemorrhage at the left lateral ventricle. Pronounced brain atrophy. Discussed with neurosurgeon Dr. Gayla Doss, his repeat CT showed stable dots of ICH.  Okay to start Lovenox.  If mental status deteriorates, may need to repeat head CT stat.   S/p mechanical fall 10/11/2022: No syncope as per spouse's report. PT and OT evaluation post surgery and likely SNF for STR   Hypokalemia: Replaced.  Continue to monitor   Advanced dementia with behavioral changes: - Per spouse, etiology of dementia is unclear. - High risk for hospital delirium.  Delirium precautions - Continue prior home dose of Aricept, Namenda, buspirone, Zoloft and Seroquel. - If has agitation, may need safety sitter and as needed Haldol or Seroquel or Zyprexa.   Tobacco abuse: - Prior history of significant cigarette smoking but lately infrequent use of cigars.   Hypertension: Not on any antihypertensive medications.  BP controlled. Continue to monitor blood pressure   Alcohol use disorder in remission: - History as noted above.  Monitor.   Vitamin D deficiency: 25-hydroxy vitamin D levels 15.54 ng/ml.  Start vitamin D 50,000 IU  weekly.    Body mass index is 22.57 kg/m.   DVT prophylaxis: SCDs Code Status: DNR Family Communication: No family at bed side. Disposition Plan:    Status is: Inpatient Remains inpatient appropriate because: Status post mechanical fall with right hip fracture.  Underwent ORIF.  Tolerated well PT and OT recommended SNF.    Consultants:  Orthopaedics  Procedures: CT head.  Antimicrobials:  Anti-infectives (From admission, onward)    Start     Dose/Rate Route Frequency Ordered Stop   10/12/22 1700  ceFAZolin (ANCEF)  IVPB 2g/100 mL premix        2 g 200 mL/hr over 30 Minutes Intravenous Every 6 hours 10/12/22 1600 10/13/22 0031   10/12/22 1045  ceFAZolin (ANCEF) IVPB 2g/100 mL premix        2 g 200 mL/hr over 30 Minutes Intravenous On call to O.R. 10/12/22 1044 10/12/22 1255   10/12/22 1040  ceFAZolin (ANCEF) 2-4 GM/100ML-% IVPB       Note to Pharmacy: Darrick Huntsman: cabinet override      10/12/22 1040 10/12/22 1305      Subjective: Patient was seen and examined at bedside. Overnight events noted.   Patient reports doing better.  S/p ORIF POD 1.  Patient reports pain is reasonably controlled.   Objective: Vitals:   10/13/22 1531 10/13/22 2009 10/14/22 0443 10/14/22 0859  BP: 110/71 117/78  102/79  Pulse: 89 73 81 72  Resp: 19 18 16 17   Temp: 97.9 F (36.6 C) 98.2 F (36.8 C) 98.5 F (36.9 C) 98.2 F (36.8 C)  TempSrc:  Oral Oral Oral  SpO2:  98% 98% 98%  Weight:      Height:        Intake/Output Summary (Last 24 hours) at 10/14/2022 1313 Last data filed at 10/14/2022 0651 Gross per 24 hour  Intake 420 ml  Output 200 ml  Net 220 ml   Filed Weights   10/11/22 1012 10/12/22 1043  Weight: 75.5 kg 75.5 kg    Examination:  General exam: Appears comfortable, calm, deconditioned, not in any acute distress. Respiratory system: CTA bilaterally. Respiratory effort normal.RR 14 Cardiovascular system: S1 & S2 heard, regular rate and rhythm, no murmur. Gastrointestinal system: Abdomen is soft non tender, non distended, bowel sounds present Central nervous system: Alert and oriented x 2. No focal neurological deficits. Extremities: S/p Right hip ORIF, POD 1, tenderness noted. Skin: No rashes, lesions or ulcers Psychiatry: Judgement and insight appear normal. Mood & affect appropriate.     Data Reviewed: I have personally reviewed following labs and imaging studies  CBC: Recent Labs  Lab 10/11/22 1030 10/12/22 0300 10/13/22 0248 10/14/22 0959  WBC 13.4* 8.3 7.1 7.7  HGB  14.0 13.5 12.2* 13.2  HCT 41.3 38.2* 34.8* 37.3*  MCV 98.3 93.9 97.5 94.7  PLT 178 174 164 240   Basic Metabolic Panel: Recent Labs  Lab 10/11/22 1030 10/12/22 0300 10/13/22 0248 10/14/22 0959  NA 134* 134* 137 134*  K 3.4* 3.4* 3.6 3.7  CL 102 102 102 102  CO2 22 23 25 24   GLUCOSE 126* 111* 105* 105*  BUN 11 12 9 14   CREATININE 0.81 0.71 0.74 0.77  CALCIUM 8.8* 8.5* 8.3* 8.7*  MG  --  2.1  --   --    GFR: Estimated Creatinine Clearance: 94.4 mL/min (by C-G formula based on SCr of 0.77 mg/dL). Liver Function Tests: No results for input(s): "AST", "ALT", "ALKPHOS", "BILITOT", "PROT", "ALBUMIN" in the last 168 hours. No results for input(s): "LIPASE", "AMYLASE" in the last 168 hours. No results for input(s): "AMMONIA" in the last 168 hours. Coagulation Profile: No results for input(s): "INR", "PROTIME" in the last 168 hours. Cardiac Enzymes: No results for input(s): "CKTOTAL", "CKMB", "CKMBINDEX", "TROPONINI" in the last 168 hours. BNP (last 3 results) No results for input(s): "PROBNP" in the last 8760 hours. HbA1C: No results for input(s): "HGBA1C"  in the last 72 hours. CBG: Recent Labs  Lab 10/12/22 0734  GLUCAP 118*   Lipid Profile: No results for input(s): "CHOL", "HDL", "LDLCALC", "TRIG", "CHOLHDL", "LDLDIRECT" in the last 72 hours. Thyroid Function Tests: No results for input(s): "TSH", "T4TOTAL", "FREET4", "T3FREE", "THYROIDAB" in the last 72 hours. Anemia Panel: No results for input(s): "VITAMINB12", "FOLATE", "FERRITIN", "TIBC", "IRON", "RETICCTPCT" in the last 72 hours. Sepsis Labs: No results for input(s): "PROCALCITON", "LATICACIDVEN" in the last 168 hours.  Recent Results (from the past 240 hour(s))  Surgical pcr screen     Status: Abnormal   Collection Time: 10/12/22  3:25 AM   Specimen: Nasal Mucosa; Nasal Swab  Result Value Ref Range Status   MRSA, PCR NEGATIVE NEGATIVE Final   Staphylococcus aureus POSITIVE (A) NEGATIVE Final    Comment:  (NOTE) The Xpert SA Assay (FDA approved for NASAL specimens in patients 24 years of age and older), is one component of a comprehensive surveillance program. It is not intended to diagnose infection nor to guide or monitor treatment. Performed at [facility], [address] [place], [place] [address]     Radiology Studies: DG FEMUR, [place] 2 VIEWS RIGHT  Result Date: 10/12/2022 CLINICAL DATA:  Right intramedullary rod fixation. EXAM: RIGHT FEMUR 2 VIEWS; DG C-ARM 1-60 MIN-NO REPORT COMPARISON:  Right hip radiographs-10/11/2022 FINDINGS: Four spot intraoperative fluoroscopic images of the right hip and femur are provided for review and demonstrate the sequela of intramedullary rod fixation of the right femur and dynamic screw fixation of the right femoral neck. The distal end of the femoral rod is transfixed with a single cancellous screw. Near anatomic alignment of known right-sided intertrochanteric femur fracture. No evidence of failure hardware failure or loosening. Adjacent skin staples.  No radiopaque foreign body. IMPRESSION: Post intramedullary rod fixation of the right femur and dynamic screw fixation of the right femoral neck without evidence of complication. Electronically Signed   By: Simonne Come M.D.   On: 10/12/2022 15:52   DG C-Arm 1-60 Min-No Report  Result Date: 10/12/2022 Fluoroscopy was utilized by the requesting physician.  No radiographic interpretation.    Scheduled Meds:  acetaminophen  1,000 mg Oral TID   aspirin EC  325 mg Oral Q breakfast   busPIRone  10 mg Oral TID   Chlorhexidine Gluconate Cloth  6 each Topical Q0600   cholecalciferol  1,000 Units Oral Daily   docusate sodium  100 mg Oral BID   donepezil  10 mg Oral QHS   memantine  5 mg Oral BID   mupirocin ointment  1 Application Nasal BID   QUEtiapine  25 mg Oral QHS   sertraline  100 mg Oral Daily   Vitamin D (Ergocalciferol)  50,000 Units Oral Q7 days   Continuous Infusions:  methocarbamol (ROBAXIN)  IV       LOS: 3 days    Time spent: 35 mins    Willeen Niece, MD [facility]   If 7PM-7AM, please contact night-coverage

## 2022-10-15 DIAGNOSIS — S72001D Fracture of unspecified part of neck of right femur, subsequent encounter for closed fracture with routine healing: Secondary | ICD-10-CM | POA: Diagnosis not present

## 2022-10-15 LAB — CBC
HCT: 36.4 % — ABNORMAL LOW (ref 39.0–52.0)
Hemoglobin: 12.7 g/dL — ABNORMAL LOW (ref 13.0–17.0)
MCH: 33.1 pg (ref 26.0–34.0)
MCHC: 34.9 g/dL (ref 30.0–36.0)
MCV: 94.8 fL (ref 80.0–100.0)
Platelets: 218 10*3/uL (ref 150–400)
RBC: 3.84 MIL/uL — ABNORMAL LOW (ref 4.22–5.81)
RDW: 11.9 % (ref 11.5–15.5)
WBC: 7.9 10*3/uL (ref 4.0–10.5)
nRBC: 0 % (ref 0.0–0.2)

## 2022-10-15 LAB — PHOSPHORUS: Phosphorus: 2.5 mg/dL (ref 2.5–4.6)

## 2022-10-15 LAB — BASIC METABOLIC PANEL
Anion gap: 12 (ref 5–15)
BUN: 14 mg/dL (ref 8–23)
CO2: 24 mmol/L (ref 22–32)
Calcium: 8.6 mg/dL — ABNORMAL LOW (ref 8.9–10.3)
Chloride: 98 mmol/L (ref 98–111)
Creatinine, Ser: 0.87 mg/dL (ref 0.61–1.24)
GFR, Estimated: >60 mL/min (ref >60)
Glucose, Bld: 91 mg/dL (ref 70–99)
Potassium: 3.2 mmol/L — ABNORMAL LOW (ref 3.5–5.1)
Sodium: 134 mmol/L — ABNORMAL LOW (ref 135–145)

## 2022-10-15 LAB — MAGNESIUM: Magnesium: 1.8 mg/dL (ref 1.7–2.4)

## 2022-10-15 MED ORDER — POTASSIUM CHLORIDE 20 MEQ PO PACK
40.0000 meq | PACK | Freq: Once | ORAL | Status: AC
  Administered 2022-10-15: 40 meq via ORAL
  Filled 2022-10-15: qty 2

## 2022-10-15 NOTE — Plan of Care (Signed)
  Problem: Education: Goal: Knowledge of General Education information will improve Description: Including pain rating scale, medication(s)/side effects and non-pharmacologic comfort measures Outcome: Progressing   Problem: Health Behavior/Discharge Planning: Goal: Ability to manage health-related needs will improve Outcome: Progressing   Problem: Clinical Measurements: Goal: Ability to maintain clinical measurements within normal limits will improve Outcome: Progressing Goal: Will remain free from infection Outcome: Progressing Goal: Diagnostic test results will improve Outcome: Progressing Goal: Respiratory complications will improve Outcome: Progressing Goal: Cardiovascular complication will be avoided Outcome: Progressing   Problem: Activity: Goal: Risk for activity intolerance will decrease Outcome: Progressing   Problem: Nutrition: Goal: Adequate nutrition will be maintained Outcome: Progressing   Problem: Coping: Goal: Level of anxiety will decrease Outcome: Progressing   Problem: Elimination: Goal: Will not experience complications related to bowel motility Outcome: Progressing Goal: Will not experience complications related to urinary retention Outcome: Progressing   Problem: Pain Managment: Goal: General experience of comfort will improve Outcome: Progressing   Problem: Safety: Goal: Ability to remain free from injury will improve Outcome: Progressing   Problem: Skin Integrity: Goal: Risk for impaired skin integrity will decrease Outcome: Progressing   Problem: Education: Goal: Verbalization of understanding the information provided (i.e., activity precautions, restrictions, etc) will improve Outcome: Progressing Goal: Individualized Educational Video(s) Outcome: Progressing   Problem: Activity: Goal: Ability to ambulate and perform ADLs will improve Outcome: Progressing   Problem: Clinical Measurements: Goal: Postoperative complications will be  avoided or minimized Outcome: Progressing   Problem: Self-Concept: Goal: Ability to maintain and perform role responsibilities to the fullest extent possible will improve Outcome: Progressing   Problem: Pain Management: Goal: Pain level will decrease Outcome: Progressing   Problem: Safety: Goal: Non-violent Restraint(s) Outcome: Progressing   

## 2022-10-15 NOTE — TOC Progression Note (Signed)
Transition of Care [facility]) - Progression Note    Patient Details  Name: Miko Sirico MRN: [id number] Date of Birth: 15-Aug-1955  [facility]) CM/SW Contact  Mearl Latin, LCSW Phone Number: 10/15/2022, 4:13 PM  Clinical Narrative:    CSW received call from [facility] stating insurance approval has been received. Will plan to send patient tomorrow via PTAR. CSW updated MD and patient's spouse.     Expected Discharge Plan: Skilled Nursing Facility Barriers to Discharge: Continued Medical Work up, SNF Pending bed offer  Expected Discharge Plan and Services In-house Referral: Clinical Social Work   Post Acute Care Choice:  (awaiting PT recs) Living arrangements for the past 2 months: Single Family Home                                       Social Determinants of Health (SDOH) Interventions SDOH Screenings   Tobacco Use: High Risk (10/13/2022)    Readmission Risk Interventions     No data to display

## 2022-10-15 NOTE — TOC Progression Note (Addendum)
Transition of Care [facility]) - Progression Note    Patient Details  Name: Timmy Bubeck MRN: [id number] Date of Birth: 11-24-1955  Transition of Care [facility]) CM/SW Contact  Lorri Frederick, LCSW Phone Number: 10/15/2022, 8:40 AM  Clinical Narrative:   [facility] unable to offer.    TC with wife, updated on the above, she would like to accept offer [facility].  She also is meeting with [place]/financial counseling today to sign medicaid application paperwork.   [facility] does have bed, can accept pt today.  1000: unable to pull pt up in portal even by calling navi--auth started over the phone, clinical faxed. [id number].  1315: Auth remains pending in Navi: name does pull up now but says: [facility]   Expected Discharge Plan: Skilled Nursing Facility Barriers to Discharge: Continued Medical Work up, SNF Pending bed offer  Expected Discharge Plan and Services In-house Referral: Clinical Social Work   Post Acute Care Choice:  (awaiting PT recs) Living arrangements for the past 2 months: Single Family Home                                       Social Determinants of Health (SDOH) Interventions SDOH Screenings   Tobacco Use: High Risk (10/13/2022)    Readmission Risk Interventions     No data to display

## 2022-10-15 NOTE — Progress Notes (Signed)
PROGRESS NOTE    Tanner Bond  ZOX:[id number] DOB: Apr 08, 1956 DOA: 10/11/2022 PCP: [name]., MD   Brief Narrative:  This 67 year old male, lives with his spouse, ambulates slowly but without assistance, medical history significant for advanced dementia with behavioral changes, s/p appendectomy, right ankle fracture treated nonsurgically, alcohol use disorder in remission, tobacco use, (spouse denies documented history of HTN), brought in by ambulance to [facility] ED on 10/11/2022 following unwitnessed fall at home on 10/11/2022 at approximately 6:30 AM.  Admitted for right hip fracture/minimally displaced intertrochanteric hip fracture.  Orthopedics consulted and He underwent ORIF on 10/13/22.   Assessment & Plan:   Principal Problem:   Closed right hip fracture (HCC) Active Problems:   Alcoholism in remission (HCC)   Hypertension   Intraventricular hemorrhage (HCC)   Dementia with behavioral disturbance (HCC)   Hypokalemia   Tobacco use disorder  Right hip fracture closed, acute: He presented s/p mechanical fall at home on 10/11/2022. Xray right hip > Nondisplaced fracture is possible. CT of the right hip: Appears to have a complex right hip fracture.   As per orthopedics, this is a minimally displaced intertrochanteric hip fracture. Orthopedics / Dr. Luiz Blare has evaluated the patient and planning surgical fixation Continue Multimodality pain control, initiated scheduled Tylenol 1 g 3 times daily.   Minimize opioids as much as possible.  Pain appears to be controlled. No reported cardiac, respiratory history and patient is optimized to proceed with indicated surgery with anticipated risks. Patient underwent successful ORIF on 10/13/2022. POD # 2 PT and OT recommended SNF.  Aspirin 325 mg daily for 1 month postoperative for DVT prophylaxis.   Intraventricular hemorrhage: CT head 10/11/2022: Tiny amount of intraventricular hemorrhage within the left occipital horn.   No  other acute abnormality identified.  Likely related to fall and head trauma. Patient is neurologically intact, no focal deficits noted. As per EDP discussion with Dr. Wynetta Emery /Neurosurgery, recommends repeating head CT in 12 to 24 hours.   Follow-up CT head 6/4: No progression of trace hemorrhage at the left lateral ventricle. Pronounced brain atrophy. Discussed with neurosurgeon Dr. Wynetta Emery, his repeat CT showed stable dots of ICH.  Okay to start Lovenox. If mental status deteriorates, may need to repeat head CT stat.   S/p mechanical fall 10/11/2022: No syncope as per spouse's report. PT and OT evaluation post surgery and likely SNF for STR   Hypokalemia: Replaced.  Continue to monitor   Advanced dementia with behavioral changes: - Per spouse, etiology of dementia is unclear. - High risk for hospital delirium.  Delirium precautions - Continue prior home dose of Aricept, Namenda, buspirone, Zoloft and Seroquel. - If has agitation, may need safety sitter and as needed Haldol or Seroquel or Zyprexa.   Tobacco abuse: - Prior history of significant cigarette smoking but lately infrequent use of cigars.   Hypertension: Not on any antihypertensive medications.  BP controlled. Continue to monitor blood pressure   Alcohol use disorder in remission: - History as noted above.  Monitor.   Vitamin D deficiency: 25-hydroxy vitamin D levels 15.54 ng/ml.  Start vitamin D 50,000 IU  weekly.    Body mass index is 22.57 kg/m.   DVT prophylaxis: SCDs Code Status: DNR Family Communication: No family at bed side. Disposition Plan:    Status is: Inpatient Remains inpatient appropriate because: Status post mechanical fall with right hip fracture.  Underwent ORIF.  Tolerated well PT and OT recommended SNF. Pending insurance authorization for SNF placement.  Consultants:  Orthopaedics  Procedures: CT head.  Antimicrobials:  Anti-infectives (From admission, onward)    Start     Dose/Rate  Route Frequency Ordered Stop   10/12/22 1700  ceFAZolin (ANCEF) IVPB 2g/100 mL premix        2 g 200 mL/hr over 30 Minutes Intravenous Every 6 hours 10/12/22 1600 10/13/22 0031   10/12/22 1045  ceFAZolin (ANCEF) IVPB 2g/100 mL premix        2 g 200 mL/hr over 30 Minutes Intravenous On call to O.R. 10/12/22 1044 10/12/22 1255   10/12/22 1040  ceFAZolin (ANCEF) 2-4 GM/100ML-% IVPB       Note to Pharmacy: Darrick Huntsman: cabinet override      10/12/22 1040 10/12/22 1305      Subjective: Patient was seen and examined at bedside. Overnight events noted.   Patient reports doing better.  S/p ORIF POD 2.  Patient reports pain is reasonably controlled. Patient wants to be discharged to SNF.   Objective: Vitals:   10/14/22 1953 10/15/22 0414 10/15/22 0719 10/15/22 1251  BP: (!) 136/90 126/67 (!) 148/85 129/78  Pulse: 80 77 80 85  Resp: 18 19 16    Temp: 98 F (36.7 C) 98.2 F (36.8 C) 98.8 F (37.1 C) 98.9 F (37.2 C)  TempSrc: Oral Oral Oral Oral  SpO2: 98% 99% 94% 100%  Weight:      Height:        Intake/Output Summary (Last 24 hours) at 10/15/2022 1409 Last data filed at 10/15/2022 0900 Gross per 24 hour  Intake 120 ml  Output --  Net 120 ml   Filed Weights   10/11/22 1012 10/12/22 1043  Weight: 75.5 kg 75.5 kg    Examination:  General exam: Appears comfortable, deconditioned, not in any acute distress. Respiratory system: CTA bilaterally. Respiratory effort normal. RR 13 Cardiovascular system: S1 & S2 heard, regular rate and rhythm, no murmur. Gastrointestinal system: Abdomen is soft non tender, non distended, bowel sounds present Central nervous system: Alert and oriented x 2. No focal neurological deficits. Extremities: S/p Right hip ORIF, POD 2, tenderness noted. Skin: No rashes, lesions or ulcers Psychiatry: Judgement and insight appear normal. Mood & affect appropriate.     Data Reviewed: I have personally reviewed following labs and imaging  studies  CBC: Recent Labs  Lab 10/11/22 1030 10/12/22 0300 10/13/22 0248 10/14/22 0959 10/15/22 0804  WBC 13.4* 8.3 7.1 7.7 7.9  HGB 14.0 13.5 12.2* 13.2 12.7*  HCT 41.3 38.2* 34.8* 37.3* 36.4*  MCV 98.3 93.9 97.5 94.7 94.8  PLT 178 174 164 240 218   Basic Metabolic Panel: Recent Labs  Lab 10/11/22 1030 10/12/22 0300 10/13/22 0248 10/14/22 0959 10/15/22 0804  NA 134* 134* 137 134* 134*  K 3.4* 3.4* 3.6 3.7 3.2*  CL 102 102 102 102 98  CO2 22 23 25 24 24   GLUCOSE 126* 111* 105* 105* 91  BUN 11 12 9 14 14   CREATININE 0.81 0.71 0.74 0.77 0.87  CALCIUM 8.8* 8.5* 8.3* 8.7* 8.6*  MG  --  2.1  --   --  1.8  PHOS  --   --   --   --  2.5   GFR: Estimated Creatinine Clearance: 86.8 mL/min (by C-G formula based on SCr of 0.87 mg/dL). Liver Function Tests: No results for input(s): "AST", "ALT", "ALKPHOS", "BILITOT", "PROT", "ALBUMIN" in the last 168 hours. No results for input(s): "LIPASE", "AMYLASE" in the last 168 hours. No results for input(s): "AMMONIA" in the  last 168 hours. Coagulation Profile: No results for input(s): "INR", "PROTIME" in the last 168 hours. Cardiac Enzymes: No results for input(s): "CKTOTAL", "CKMB", "CKMBINDEX", "TROPONINI" in the last 168 hours. BNP (last 3 results) No results for input(s): "PROBNP" in the last 8760 hours. HbA1C: No results for input(s): "HGBA1C" in the last 72 hours. CBG: Recent Labs  Lab 10/12/22 0734  GLUCAP 118*   Lipid Profile: No results for input(s): "CHOL", "HDL", "LDLCALC", "TRIG", "CHOLHDL", "LDLDIRECT" in the last 72 hours. Thyroid Function Tests: No results for input(s): "TSH", "T4TOTAL", "FREET4", "T3FREE", "THYROIDAB" in the last 72 hours. Anemia Panel: No results for input(s): "VITAMINB12", "FOLATE", "FERRITIN", "TIBC", "IRON", "RETICCTPCT" in the last 72 hours. Sepsis Labs: No results for input(s): "PROCALCITON", "LATICACIDVEN" in the last 168 hours.  Recent Results (from the past 240 hour(s))  Surgical  pcr screen     Status: Abnormal   Collection Time: 10/12/22  3:25 AM   Specimen: Nasal Mucosa; Nasal Swab  Result Value Ref Range Status   MRSA, PCR NEGATIVE NEGATIVE Final   Staphylococcus aureus POSITIVE (A) NEGATIVE Final    Comment: (NOTE) The Xpert SA Assay (FDA approved for NASAL specimens in patients 32 years of age and older), is one component of a comprehensive surveillance program. It is not intended to diagnose infection nor to guide or monitor treatment. Performed at [facility], [address] [place], [place] [address]     Radiology Studies: No results found.  Scheduled Meds:  acetaminophen  1,000 mg Oral TID   aspirin EC  325 mg Oral Q breakfast   busPIRone  10 mg Oral TID   Chlorhexidine Gluconate Cloth  6 each Topical Q0600   cholecalciferol  1,000 Units Oral Daily   docusate sodium  100 mg Oral BID   donepezil  10 mg Oral QHS   memantine  5 mg Oral BID   mupirocin ointment  1 Application Nasal BID   potassium chloride  40 mEq Oral Once   QUEtiapine  25 mg Oral QHS   sertraline  100 mg Oral Daily   Vitamin D (Ergocalciferol)  50,000 Units Oral Q7 days   Continuous Infusions:  methocarbamol (ROBAXIN) IV       LOS: 4 days    Time spent: 35 mins    Willeen Niece, MD [facility]   If 7PM-7AM, please contact night-coverage

## 2022-10-15 NOTE — Progress Notes (Signed)
Mobility Specialist Progress Note   10/15/22 1330  Mobility  Activity Dangled on edge of bed;Ambulated with assistance in hallway  Level of Assistance Minimal assist, patient does 75% or more  Assistive Device Front wheel walker  Distance Ambulated (ft) 10 ft  Range of Motion/Exercises Active;All extremities  RLE Weight Bearing WBAT  Activity Response Tolerated fair   Patient received in supine and agreeable to participate with max encouragement. Session limited by pain. Required min A for bed mobility and to stand with cueing for hand placement. Ambulated short distance before requesting seated rest. Deferred further ambulation. Returned to supine with min A.Was left with all needs met, call bell in reach.  Tanner Bond, BS EXP Mobility Specialist Please contact via SecureChat or Rehab office at [phone]

## 2022-10-16 DIAGNOSIS — S72001D Fracture of unspecified part of neck of right femur, subsequent encounter for closed fracture with routine healing: Secondary | ICD-10-CM | POA: Diagnosis not present

## 2022-10-16 MED ORDER — VITAMIN D (ERGOCALCIFEROL) 1.25 MG (50000 UNIT) PO CAPS: 50000.0000 [IU] | ORAL_CAPSULE | ORAL | 0 refills | Status: AC

## 2022-10-16 MED ORDER — ASPIRIN 325 MG PO TBEC: 325.0000 mg | DELAYED_RELEASE_TABLET | Freq: Every day | ORAL | 0 refills | Status: AC

## 2022-10-16 MED ORDER — METHOCARBAMOL 500 MG PO TABS: 500.0000 mg | ORAL_TABLET | Freq: Four times a day (QID) | ORAL | 0 refills | Status: AC | PRN

## 2022-10-16 NOTE — Progress Notes (Signed)
Report given to [place] at [facility]. Patient waiting for PTAR at this time.

## 2022-10-16 NOTE — TOC Transition Note (Addendum)
Transition of Care [facility]) - CM/SW Discharge Note   Patient Details  Name: Tanner Bond MRN: [id number] Date of Birth: 25-Mar-1956  [facility] of [facility] CM/SW Contact:  Donnalee Curry, LCSWA Phone Number: 10/16/2022, 11:21 AM   Clinical Narrative:     SW spoke with Lowella Bandy [facility] [phone]) confirmed able to accept today.   Bed:108 Call Report: [phone]  SW spoke with pt's wife Tanner Bond [phone]) to update  PTAR called   Final next level of care: Skilled Nursing Facility Barriers to Discharge: Barriers Resolved   Patient Goals and CMS Choice CMS Medicare.gov Compare Post Acute Care list provided to:: Patient Represenative (must comment) (wife) Choice offered to / list presented to : Spouse  Discharge Placement                Patient chooses bed at: [facility] Patient to be transferred to facility by: PTAR Name of family member notified: Tanner Bond Patient and family notified of of transfer: 10/16/22  Discharge Plan and Services Additional resources added to the After Visit Summary for   In-house Referral: Clinical Social Work   Post Acute Care Choice:  (awaiting PT recs)                               Social Determinants of Health (SDOH) Interventions SDOH Screenings   Tobacco Use: High Risk (10/13/2022)     Readmission Risk Interventions     No data to display

## 2022-10-16 NOTE — Discharge Instructions (Signed)
Advised to follow-up with primary care physician in 1 week. Advised to take aspirin 325 mg daily for 4 weeks for postoperative DVT prophylaxis. Advised to take tramadol as needed for pain control. Advised to follow-up with orthopedics in 2 weeks.

## 2022-10-16 NOTE — Progress Notes (Signed)
PT Cancellation Note  Patient Details Name: Tanner Bond MRN: [id number] DOB: [birth year]   Cancelled Treatment:    Reason Eval/Treat Not Completed: (P) Patient declined, no reason specified (pt defers OOB mobility, encouraged him to continue ROMAT in supine and pt able to perform heel slides/ankle pumps on BLE without significant c/o pain. Pt defers ice pack, stating R hip "feels fine". Pt not oriented to R hip fx today.) When PTA attempted to reorient him to situation and R hip fx and benefits of mobility, pt states "I didn't break it, that was an old injury, this leg isn't my good leg" and not receptive to further instruction. Will continue efforts per PT plan of care as schedule permits.   Dorathy Kinsman Honestee Revard 10/16/2022, 3:47 PM

## 2022-10-16 NOTE — Discharge Summary (Addendum)
Physician Discharge Summary  Tanner Bond WGN:[id number] DOB: Dec 26, 1955 DOA: 10/11/2022  PCP: [name]., MD  Admit date: 10/11/2022  Discharge date: 10/16/2022  Admitted From: Home. Disposition:  SNF ( [facility])  Recommendations for Outpatient Follow-up:  Follow up with PCP in 1-2 weeks. Please obtain BMP/CBC in one week. Advised to take aspirin 325 mg daily for 4 weeks for postoperative DVT prophylaxis. Advised to take tramadol as needed for pain control. Advised to follow-up with orthopedics in 2 weeks.  Home Health: None Equipment/Devices:None  Discharge Condition: Stable CODE STATUS: DNR Diet recommendation: Heart Healthy   Brief Summary/ Hospital Course: This 67 year old male, lives with his spouse, ambulates slowly but without assistance, medical history significant for advanced dementia with behavioral changes, s/p appendectomy, right ankle fracture treated nonsurgically, alcohol use disorder in remission, tobacco use, (spouse denies documented history of HTN), brought in by ambulance to [facility] ED on 10/11/2022 following unwitnessed fall at home on 10/11/2022 at approximately 6:30 AM. Admitted for right hip fracture/minimally displaced intertrochanteric hip fracture. Orthopedics consulted and He underwent ORIF on 10/13/22.  Patient has made significant improvement with physical therapy.  PT recommended SNF.  Patient feels better, orthopedics signed off and recommended SNF for rehabilitation.  Patient is being discharged to SNF.   Discharge Diagnoses:  Principal Problem:   Closed right hip fracture (HCC) Active Problems:   Alcoholism in remission (HCC)   Hypertension   Intraventricular hemorrhage (HCC)   Dementia with behavioral disturbance (HCC)   Hypokalemia   Tobacco use disorder  Right hip fracture closed, acute: He presented s/p mechanical fall at home on 10/11/2022. Xray right hip > Nondisplaced fracture is possible. CT of the right hip: Appears  to have a complex right hip fracture.   As per orthopedics, this is a minimally displaced intertrochanteric hip fracture. Orthopedics / Dr. Luiz Blare has evaluated the patient and planning surgical fixation Continue Multimodality pain control, initiated scheduled Tylenol 1 g 3 times daily.   Minimize opioids as much as possible.  Pain appears to be controlled. No reported cardiac, respiratory history and patient is optimized to proceed with indicated surgery with anticipated risks. Patient underwent successful ORIF on 10/13/2022. POD # 3 PT and OT recommended SNF.  Aspirin 325 mg daily for 1 month postoperative for DVT prophylaxis. Patient will be discharged to SNF for rehab.   Intraventricular hemorrhage: CT head 10/11/2022: Tiny amount of intraventricular hemorrhage within the left occipital horn.   No other acute abnormality identified.  Likely related to fall and head trauma. Patient is neurologically intact, no focal deficits noted. As per EDP discussion with Dr. Wynetta Emery /Neurosurgery, recommends repeating head CT in 12 to 24 hours.   Follow-up CT head 6/4: No progression of trace hemorrhage at the left lateral ventricle. Pronounced brain atrophy. Discussed with neurosurgeon Dr. Wynetta Emery, his repeat CT showed stable dots of ICH.  Okay to start Lovenox. If mental status deteriorates, may need to repeat head CT stat.   S/p mechanical fall 10/11/2022: No syncope as per spouse's report. PT and OT evaluation post surgery and likely SNF for STR   Hypokalemia: Replaced.  Resolved.   Advanced dementia with behavioral changes: - Per spouse, etiology of dementia is unclear. - High risk for hospital delirium.  Delirium precautions - Continue prior home dose of Aricept, Namenda, buspirone, Zoloft and Seroquel.    Tobacco abuse: - Prior history of significant cigarette smoking but lately infrequent use of cigars.   Hypertension: Not on any antihypertensive medications.  BP controlled. Continue to  monitor blood pressure   Alcohol use disorder in remission: - History as noted above.  Monitor.   Vitamin D deficiency: 25-hydroxy vitamin D levels 15.54 ng/ml.  Start vitamin D 50,000 IU  weekly.     Body mass index is 22.57 kg/m.  Discharge Instructions  Discharge Instructions     Call MD for:  persistant dizziness or light-headedness   Complete by: As directed    Call MD for:  persistant nausea and vomiting   Complete by: As directed    Call MD for:  severe uncontrolled pain   Complete by: As directed    Diet - low sodium heart healthy   Complete by: As directed    Diet Carb Modified   Complete by: As directed    Discharge instructions   Complete by: As directed    Advised to follow-up with primary care physician in 1 week. Advised to take aspirin 325 mg daily for 4 weeks for postoperative DVT prophylaxis. Advised to take tramadol as needed for pain control. Advised to follow-up with orthopedics in 2 weeks.   Increase activity slowly   Complete by: As directed    Weight bearing as tolerated   Complete by: As directed    Laterality: right   Extremity: Lower      Allergies as of 10/16/2022   No Known Allergies      Medication List     TAKE these medications    aspirin EC 325 MG tablet Take 1 tablet (325 mg total) by mouth daily with breakfast for 24 days. Start taking on: October 17, 2022   busPIRone 10 MG tablet Commonly known as: BUSPAR Take 10 mg by mouth 3 (three) times daily.   Centrum Silver 50+Men Tabs Take 1 tablet by mouth daily.   donepezil 10 MG tablet Commonly known as: ARICEPT Take 10 mg by mouth at bedtime.   FISH OIL ADULT GUMMIES PO Take 1 capsule by mouth daily.   memantine 10 MG tablet Commonly known as: NAMENDA Take 5 mg by mouth 2 (two) times daily.   methocarbamol 500 MG tablet Commonly known as: ROBAXIN Take 1 tablet (500 mg total) by mouth every 6 (six) hours as needed for up to 15 days for muscle spasms.   QUEtiapine 25  MG tablet Commonly known as: SEROQUEL Take 25 mg by mouth at bedtime.   sertraline 100 MG tablet Commonly known as: ZOLOFT Take 100 mg by mouth daily.   traMADol 50 MG tablet Commonly known as: Ultram Take 1 tablet (50 mg total) by mouth every 6 (six) hours as needed for moderate pain.   Vitamin D (Ergocalciferol) 1.25 MG (50000 UNIT) Caps capsule Commonly known as: DRISDOL Take 1 capsule (50,000 Units total) by mouth every 7 (seven) days. Start taking on: October 20, 2022               Discharge Care Instructions  (From admission, onward)           Start     Ordered   10/12/22 0000  Weight bearing as tolerated       Question Answer Comment  Laterality right   Extremity Lower      10/12/22 1401            Contact information for follow-up providers     Jodi Geralds, MD. Schedule an appointment as soon as possible for a visit in 2 week(s).   Specialty: Orthopedic Surgery Why: WBAT ambulation walker as  needed. no restrictions to bed mobility Contact information: [address] [place] [address] [phone]         [name]., MD Follow up in 1 week(s).   Specialty: Internal Medicine Contact information: [address] [place] [address] [phone]              Contact information for after-discharge care     Destination     [facility] Preferred SNF .   Service: Skilled Nursing Contact information: [address] [place] [address] [phone]                    No Known Allergies  Consultations: Orthopeadics   Procedures/Studies: DG FEMUR, MIN 2 VIEWS RIGHT  Result Date: 10/12/2022 CLINICAL DATA:  Right intramedullary rod fixation. EXAM: RIGHT FEMUR 2 VIEWS; DG C-ARM 1-60 MIN-NO REPORT COMPARISON:  Right hip radiographs-10/11/2022 FINDINGS: Four spot intraoperative fluoroscopic images of the right hip and femur are provided for review and demonstrate the sequela of  intramedullary rod fixation of the right femur and dynamic screw fixation of the right femoral neck. The distal end of the femoral rod is transfixed with a single cancellous screw. Near anatomic alignment of known right-sided intertrochanteric femur fracture. No evidence of failure hardware failure or loosening. Adjacent skin staples.  No radiopaque foreign body. IMPRESSION: Post intramedullary rod fixation of the right femur and dynamic screw fixation of the right femoral neck without evidence of complication. Electronically Signed   By: Simonne Come M.D.   On: 10/12/2022 15:52   DG C-Arm 1-60 Min-No Report  Result Date: 10/12/2022 Fluoroscopy was utilized by the requesting physician.  No radiographic interpretation.   CT HEAD WO CONTRAST ( )  Result Date: 10/12/2022 CLINICAL DATA:  Headache, sudden and severe.  Altered mental status. EXAM: CT HEAD WITHOUT CONTRAST TECHNIQUE: Contiguous axial images were obtained from the base of the skull through the vertex without intravenous contrast. RADIATION DOSE REDUCTION: This exam was performed according to the departmental dose-optimization program which includes automated exposure control, adjustment of the mA and/or kV according to patient size and/or use of iterative reconstruction technique. COMPARISON:  Head CT from yesterday FINDINGS: Brain: Trace high-density hemorrhage at the occipital horn of the left lateral ventricle. No interval progression. Advanced brain atrophy with ventriculomegaly. No evidence of acute infarct, tract of hydrocephalus, mass, or collection. Vascular: No hyperdense vessel or unexpected calcification. Skull: Normal. Negative for fracture or focal lesion. Sinuses/Orbits: No acute finding. IMPRESSION: No progression of trace hemorrhage at the left lateral ventricle. Pronounced brain atrophy. Electronically Signed   By: Tiburcio Pea M.D.   On: 10/12/2022 06:33   CT Hip Right Wo Contrast  Result Date: 10/11/2022 CLINICAL DATA:  Hip  trauma.  Fracture suspected.  Fall. EXAM: CT OF THE RIGHT HIP WITHOUT CONTRAST TECHNIQUE: Multidetector CT imaging of the right hip was performed according to the standard protocol. Multiplanar CT image reconstructions were also generated. RADIATION DOSE REDUCTION: This exam was performed according to the departmental dose-optimization program which includes automated exposure control, adjustment of the mA and/or kV according to patient size and/or use of iterative reconstruction technique. COMPARISON:  Pelvis and right hip radiographs 10/11/2022 FINDINGS: Bones/Joint/Cartilage There is an oblique fracture extending from the anterior inferior aspect of the greater trochanter through the anterior aspect of the lesser trochanter (coronal series 4 images 27 through 30). There is up to 9 mm transverse dimension diastasis within the anterior superior aspect of this fracture but  no significant diastasis within the inferior aspect of this fracture. There is an additional nondisplaced oblique fracture within the lesser tuberosity (axial series 2, images 47-51). Acute fracture of the far posterior aspect of the femoral neck-greater trochanter junction (axial series 3, image 123 and coronal series 4, image 34). There is mild anterior apex angulation of the dominant anterior intertrochanteric and posterior distal femoral neck fractures. Mild-to-moderate right greater than left sacroiliac joint space narrowing, subchondral sclerosis, and anterior peripheral osteophytosis. The pubic symphysis joint space is maintained. Ligaments Suboptimally assessed by CT. Muscles and Tendons There is mildly decreased definition within the just psoas musculotendinous junction which may be secondary to mild posttraumatic interstitial muscle tearing and/or blood products (axial series 2 images 45 through 48). No gross tendon tear is visualized. Soft tissues Moderate partially visualized sigmoid diverticulosis. IMPRESSION: 1. Acute oblique  fracture extending from the anterior inferior aspect of the greater trochanter through the anterior aspect of the lesser trochanter. There is up to 9 mm transverse dimension diastasis within the anterior superior aspect of this fracture but no significant diastasis within the inferior aspect of this fracture. 2. Additional nondisplaced acute oblique fracture within the lesser tuberosity. 3. Acute fracture of the far posterior aspect of the distal femoral neck at the junction with the greater trochanter. There is mild anterior apex angulation of the dominant anterior intertrochanteric and posterior distal femoral neck fractures. Electronically Signed   By: Neita Garnet M.D.   On: 10/11/2022 16:40   CT Head Wo Contrast  Result Date: 10/11/2022 CLINICAL DATA:  67 year old male with head injury. EXAM: CT HEAD WITHOUT CONTRAST TECHNIQUE: Contiguous axial images were obtained from the base of the skull through the vertex without intravenous contrast. RADIATION DOSE REDUCTION: This exam was performed according to the departmental dose-optimization program which includes automated exposure control, adjustment of the mA and/or kV according to patient size and/or use of iterative reconstruction technique. COMPARISON:  None Available. FINDINGS: Brain: A 3 mm hyperdensity within the occipital horn of the LEFT LATERAL ventricle is noted compatible with a tiny amount of intraventricular hemorrhage. No other hemorrhage identified. There is no evidence of mass lesion or mass effect, hydrocephalus, extra-axial collection, acute infarct or midline shift. Atrophy and probable mild chronic small vessel white matter ischemic changes are noted. Vascular: Carotid atherosclerotic calcifications are noted. Skull: Normal. Negative for fracture or focal lesion. Sinuses/Orbits: No acute finding. Other: None. IMPRESSION: 1. Tiny amount of intraventricular hemorrhage within LEFT occipital horn. No other acute abnormality identified. 2.  Atrophy and probable mild chronic small vessel white matter ischemic changes. Critical Value/emergent results were called by telephone at the time of interpretation on 10/11/2022 at 1:27 pm to provider [facility] RAY , who verbally acknowledged these results. Electronically Signed   By: Harmon Pier M.D.   On: 10/11/2022 13:27   CT Cervical Spine Wo Contrast  Result Date: 10/11/2022 CLINICAL DATA:  Neck trauma (Age >= 65y) EXAM: CT CERVICAL SPINE WITHOUT CONTRAST TECHNIQUE: Multidetector CT imaging of the cervical spine was performed without intravenous contrast. Multiplanar CT image reconstructions were also generated. RADIATION DOSE REDUCTION: This exam was performed according to the departmental dose-optimization program which includes automated exposure control, adjustment of the mA and/or kV according to patient size and/or use of iterative reconstruction technique. COMPARISON:  None Available. FINDINGS: Alignment: Mild straightening of the normal cervical lordosis. Skull base and vertebrae: No acute fracture. No primary bone lesion or focal pathologic process. Soft tissues and spinal canal: No prevertebral fluid or swelling.  No visible canal hematoma. Disc levels:  No evidence of high-grade spinal canal stenosis Upper chest: Negative. Other: None. IMPRESSION: No acute fracture or traumatic subluxation of the cervical spine. Electronically Signed   By: Lorenza Cambridge M.D.   On: 10/11/2022 11:17   DG Hip Unilat W or Wo Pelvis 2-3 Views Right  Result Date: 10/11/2022 CLINICAL DATA:  Pain after fall EXAM: DG HIP (WITH OR WITHOUT PELVIS) 3V RIGHT COMPARISON:  None Available. FINDINGS: Oblique lucency along the intertrochanteric region of the right hip. Nondisplaced fracture is possible. Recommend further characterization with CT. Otherwise no additional fracture or dislocation. Preserved joint spaces. There is global osteopenia. Well rounded densities in the seen in the pelvis consistent with phleboliths. There  also other vascular calcifications noted. IMPRESSION: Oblique lucency along the intertrochanteric region of the right hip. Nondisplaced fracture is possible. Recommend further characterization with CT. Electronically Signed   By: Karen Kays M.D.   On: 10/11/2022 11:03     Subjective: Patient was seen and examined at bedside.  Overnight events noted.   Patient reports doing much better.  Still has mild pain but is reasonably controlled.   Patient is being discharged to SNF.  Discharge Exam: Vitals:   10/15/22 1912 10/16/22 0641  BP: 137/78 117/70  Pulse: 83 78  Resp: 16 18  Temp: 98.9 F (37.2 C) 98 F (36.7 C)  SpO2:  98%   Vitals:   10/15/22 0719 10/15/22 1251 10/15/22 1912 10/16/22 0641  BP: (!) 148/85 129/78 137/78 117/70  Pulse: 80 85 83 78  Resp: 16 18 16 18   Temp: 98.8 F (37.1 C) 98.9 F (37.2 C) 98.9 F (37.2 C) 98 F (36.7 C)  TempSrc: Oral Oral Oral Oral  SpO2: 94% 100%  98%  Weight:      Height:        General: Pt is alert, awake, not in acute distress Cardiovascular: RRR, S1/S2 +, no rubs, no gallops Respiratory: CTA bilaterally, no wheezing, no rhonchi Abdominal: Soft, NT, ND, bowel sounds + Extremities: no edema, no cyanosis    The results of significant diagnostics from this hospitalization (including imaging, microbiology, ancillary and laboratory) are listed below for reference.     Microbiology: Recent Results (from the past 240 hour(s))  Surgical pcr screen     Status: Abnormal   Collection Time: 10/12/22  3:25 AM   Specimen: Nasal Mucosa; Nasal Swab  Result Value Ref Range Status   MRSA, PCR NEGATIVE NEGATIVE Final   Staphylococcus aureus POSITIVE (A) NEGATIVE Final    Comment: (NOTE) The Xpert SA Assay (FDA approved for NASAL specimens in patients 9 years of age and older), is one component of a comprehensive surveillance program. It is not intended to diagnose infection nor to guide or monitor treatment. Performed at [facility], [address], [place], [place] [address]      Labs: BNP (last 3 results) No results for input(s): "BNP" in the last 8760 hours. Basic Metabolic Panel: Recent Labs  Lab 10/11/22 1030 10/12/22 0300 10/13/22 0248 10/14/22 0959 10/15/22 0804  NA 134* 134* 137 134* 134*  K 3.4* 3.4* 3.6 3.7 3.2*  CL 102 102 102 102 98  CO2 22 23 25 24 24   GLUCOSE 126* 111* 105* 105* 91  BUN 11 12 9 14 14   CREATININE 0.81 0.71 0.74 0.77 0.87  CALCIUM 8.8* 8.5* 8.3* 8.7* 8.6*  MG  --  2.1  --   --  1.8  PHOS  --   --   --   --  2.5   Liver Function Tests: No results for input(s): "AST", "ALT", "ALKPHOS", "BILITOT", "PROT", "ALBUMIN" in the last 168 hours. No results for input(s): "LIPASE", "AMYLASE" in the last 168 hours. No results for input(s): "AMMONIA" in the last 168 hours. CBC: Recent Labs  Lab 10/11/22 1030 10/12/22 0300 10/13/22 0248 10/14/22 0959 10/15/22 0804  WBC 13.4* 8.3 7.1 7.7 7.9  HGB 14.0 13.5 12.2* 13.2 12.7*  HCT 41.3 38.2* 34.8* 37.3* 36.4*  MCV 98.3 93.9 97.5 94.7 94.8  PLT 178 174 164 240 218   Cardiac Enzymes: No results for input(s): "CKTOTAL", "CKMB", "CKMBINDEX", "TROPONINI" in the last 168 hours. BNP: Invalid input(s): "POCBNP" CBG: Recent Labs  Lab 10/12/22 0734  GLUCAP 118*   D-Dimer No results for input(s): "DDIMER" in the last 72 hours. Hgb A1c No results for input(s): "HGBA1C" in the last 72 hours. Lipid Profile No results for input(s): "CHOL", "HDL", "LDLCALC", "TRIG", "CHOLHDL", "LDLDIRECT" in the last 72 hours. Thyroid function studies No results for input(s): "TSH", "T4TOTAL", "T3FREE", "THYROIDAB" in the last 72 hours.  Invalid input(s): "FREET3" Anemia work up No results for input(s): "VITAMINB12", "FOLATE", "FERRITIN", "TIBC", "IRON", "RETICCTPCT" in the last 72 hours. Urinalysis    Component Value Date/Time   BILIRUBINUR Negative 07/05/2017 1140   PROTEINUR Negative 07/05/2017 1140   UROBILINOGEN 0.2 07/05/2017 1140    NITRITE Negative 07/05/2017 1140   LEUKOCYTESUR Negative 07/05/2017 1140   Sepsis Labs Recent Labs  Lab 10/12/22 0300 10/13/22 0248 10/14/22 0959 10/15/22 0804  WBC 8.3 7.1 7.7 7.9   Microbiology Recent Results (from the past 240 hour(s))  Surgical pcr screen     Status: Abnormal   Collection Time: 10/12/22  3:25 AM   Specimen: Nasal Mucosa; Nasal Swab  Result Value Ref Range Status   MRSA, PCR NEGATIVE NEGATIVE Final   Staphylococcus aureus POSITIVE (A) NEGATIVE Final    Comment: (NOTE) The Xpert SA Assay (FDA approved for NASAL specimens in patients 15 years of age and older), is one component of a comprehensive surveillance program. It is not intended to diagnose infection nor to guide or monitor treatment. Performed at [facility], [address], [place], [place] [address]      Time coordinating discharge: Over 30 minutes  SIGNED:   Willeen Niece, MD  [facility] 10/16/2022, 10:52 AM Pager   If 7PM-7AM, please contact night-coverage

## 2023-05-12 ENCOUNTER — Other Ambulatory Visit: Payer: Self-pay

## 2023-05-12 ENCOUNTER — Emergency Department (HOSPITAL_COMMUNITY): Payer: No Typology Code available for payment source

## 2023-05-12 ENCOUNTER — Inpatient Hospital Stay (HOSPITAL_COMMUNITY): Payer: No Typology Code available for payment source

## 2023-05-12 ENCOUNTER — Inpatient Hospital Stay (HOSPITAL_COMMUNITY)
Admission: EM | Admit: 2023-05-12 | Discharge: 2023-05-17 | DRG: 521 | Disposition: A | Discharge status: Skilled Nursing Facility | Payer: No Typology Code available for payment source | Source: Skilled Nursing Facility | Attending: Internal Medicine | Admitting: Internal Medicine

## 2023-05-12 DIAGNOSIS — M898X9 Other specified disorders of bone, unspecified site: Secondary | ICD-10-CM | POA: Diagnosis present

## 2023-05-12 DIAGNOSIS — M810 Age-related osteoporosis without current pathological fracture: Secondary | ICD-10-CM | POA: Diagnosis present

## 2023-05-12 DIAGNOSIS — F1021 Alcohol dependence, in remission: Secondary | ICD-10-CM | POA: Diagnosis present

## 2023-05-12 DIAGNOSIS — I10 Essential (primary) hypertension: Secondary | ICD-10-CM | POA: Diagnosis present

## 2023-05-12 DIAGNOSIS — F17290 Nicotine dependence, other tobacco product, uncomplicated: Secondary | ICD-10-CM | POA: Diagnosis present

## 2023-05-12 DIAGNOSIS — S72032A Displaced midcervical fracture of left femur, initial encounter for closed fracture: Principal | ICD-10-CM | POA: Diagnosis present

## 2023-05-12 DIAGNOSIS — Z8673 Personal history of transient ischemic attack (TIA), and cerebral infarction without residual deficits: Secondary | ICD-10-CM

## 2023-05-12 DIAGNOSIS — Z66 Do not resuscitate: Secondary | ICD-10-CM | POA: Diagnosis present

## 2023-05-12 DIAGNOSIS — D509 Iron deficiency anemia, unspecified: Secondary | ICD-10-CM | POA: Diagnosis present

## 2023-05-12 DIAGNOSIS — Z79899 Other long term (current) drug therapy: Secondary | ICD-10-CM

## 2023-05-12 DIAGNOSIS — M79605 Pain in left leg: Secondary | ICD-10-CM | POA: Diagnosis present

## 2023-05-12 DIAGNOSIS — N401 Enlarged prostate with lower urinary tract symptoms: Secondary | ICD-10-CM | POA: Diagnosis present

## 2023-05-12 DIAGNOSIS — E876 Hypokalemia: Secondary | ICD-10-CM | POA: Diagnosis not present

## 2023-05-12 DIAGNOSIS — G9341 Metabolic encephalopathy: Secondary | ICD-10-CM | POA: Diagnosis present

## 2023-05-12 DIAGNOSIS — Y92129 Unspecified place in nursing home as the place of occurrence of the external cause: Secondary | ICD-10-CM

## 2023-05-12 DIAGNOSIS — F03918 Unspecified dementia, unspecified severity, with other behavioral disturbance: Secondary | ICD-10-CM | POA: Diagnosis present

## 2023-05-12 DIAGNOSIS — Z781 Physical restraint status: Secondary | ICD-10-CM

## 2023-05-12 DIAGNOSIS — D539 Nutritional anemia, unspecified: Secondary | ICD-10-CM | POA: Diagnosis present

## 2023-05-12 DIAGNOSIS — K709 Alcoholic liver disease, unspecified: Secondary | ICD-10-CM | POA: Diagnosis present

## 2023-05-12 DIAGNOSIS — D7589 Other specified diseases of blood and blood-forming organs: Secondary | ICD-10-CM | POA: Diagnosis present

## 2023-05-12 DIAGNOSIS — S72002A Fracture of unspecified part of neck of left femur, initial encounter for closed fracture: Principal | ICD-10-CM | POA: Diagnosis present

## 2023-05-12 DIAGNOSIS — W19XXXA Unspecified fall, initial encounter: Secondary | ICD-10-CM | POA: Diagnosis present

## 2023-05-12 LAB — CBC
HCT: 35.9 % — ABNORMAL LOW (ref 39.0–52.0)
Hemoglobin: 12.1 g/dL — ABNORMAL LOW (ref 13.0–17.0)
MCH: 34.4 pg — ABNORMAL HIGH (ref 26.0–34.0)
MCHC: 33.7 g/dL (ref 30.0–36.0)
MCV: 102 fL — ABNORMAL HIGH (ref 80.0–100.0)
Platelets: 153 10*3/uL (ref 150–400)
RBC: 3.52 MIL/uL — ABNORMAL LOW (ref 4.22–5.81)
RDW: 12.4 % (ref 11.5–15.5)
WBC: 11 10*3/uL — ABNORMAL HIGH (ref 4.0–10.5)
nRBC: 0 % (ref 0.0–0.2)

## 2023-05-12 LAB — BASIC METABOLIC PANEL
Anion gap: 8 (ref 5–15)
BUN: 17 mg/dL (ref 8–23)
CO2: 26 mmol/L (ref 22–32)
Calcium: 8.7 mg/dL — ABNORMAL LOW (ref 8.9–10.3)
Chloride: 106 mmol/L (ref 98–111)
Creatinine, Ser: 0.73 mg/dL (ref 0.61–1.24)
GFR, Estimated: >60 mL/min (ref >60)
Glucose, Bld: 108 mg/dL — ABNORMAL HIGH (ref 70–99)
Potassium: 3.6 mmol/L (ref 3.5–5.1)
Sodium: 140 mmol/L (ref 135–145)

## 2023-05-12 LAB — HIV ANTIBODY (ROUTINE TESTING W REFLEX): HIV Screen 4th Generation wRfx: NONREACTIVE

## 2023-05-12 MED ORDER — TRANEXAMIC ACID-NACL 1000-0.7 MG/100ML-% IV SOLN
1000.0000 mg | INTRAVENOUS | Status: AC
  Administered 2023-05-13: 1000 mg via INTRAVENOUS
  Filled 2023-05-12: qty 100

## 2023-05-12 MED ORDER — CHLORHEXIDINE GLUCONATE 4 % EX SOLN
60.0000 mL | Freq: Once | CUTANEOUS | Status: DC
  Filled 2023-05-12: qty 60
  Filled 2023-05-12: qty 15

## 2023-05-12 MED ORDER — HYDROCODONE-ACETAMINOPHEN 5-325 MG PO TABS: 1.0000 | ORAL_TABLET | Freq: Four times a day (QID) | ORAL | Status: DC | PRN

## 2023-05-12 MED ORDER — HYDROMORPHONE HCL 1 MG/ML IJ SOLN
0.5000 mg | INTRAMUSCULAR | Status: AC | PRN
Start: 2023-05-12 — End: ?

## 2023-05-12 MED ORDER — BUSPIRONE HCL 10 MG PO TABS
10.0000 mg | ORAL_TABLET | Freq: Three times a day (TID) | ORAL | Status: DC
  Administered 2023-05-12 – 2023-05-17 (×14): 10 mg via ORAL
  Filled 2023-05-12 (×14): qty 1

## 2023-05-12 MED ORDER — POLYETHYLENE GLYCOL 3350 17 G PO PACK: 17.0000 g | PACK | Freq: Every day | ORAL | Status: DC | PRN

## 2023-05-12 MED ORDER — SERTRALINE HCL 100 MG PO TABS
100.0000 mg | ORAL_TABLET | Freq: Every day | ORAL | Status: DC
  Administered 2023-05-14 – 2023-05-17 (×4): 100 mg via ORAL
  Filled 2023-05-12 (×4): qty 1

## 2023-05-12 MED ORDER — ACETAMINOPHEN 325 MG PO TABS
650.0000 mg | ORAL_TABLET | Freq: Four times a day (QID) | ORAL | Status: DC | PRN
  Administered 2023-05-12 – 2023-05-14 (×2): 650 mg via ORAL
  Filled 2023-05-12 (×2): qty 2

## 2023-05-12 MED ORDER — QUETIAPINE FUMARATE 25 MG PO TABS
25.0000 mg | ORAL_TABLET | Freq: Every day | ORAL | Status: DC
  Administered 2023-05-13 – 2023-05-16 (×4): 25 mg via ORAL
  Filled 2023-05-12 (×4): qty 1

## 2023-05-12 MED ORDER — MEMANTINE HCL 10 MG PO TABS
5.0000 mg | ORAL_TABLET | Freq: Two times a day (BID) | ORAL | Status: DC
  Administered 2023-05-12 – 2023-05-17 (×9): 5 mg via ORAL
  Filled 2023-05-12 (×9): qty 1

## 2023-05-12 MED ORDER — POVIDONE-IODINE 10 % EX SWAB
2.0000 | Freq: Once | CUTANEOUS | Status: AC
  Administered 2023-05-13: 2 via TOPICAL

## 2023-05-12 MED ORDER — CEFAZOLIN SODIUM-DEXTROSE 2-4 GM/100ML-% IV SOLN
2.0000 g | INTRAVENOUS | Status: AC
  Administered 2023-05-13: 2 g via INTRAVENOUS
  Filled 2023-05-12: qty 100

## 2023-05-12 MED ORDER — DONEPEZIL HCL 10 MG PO TABS
10.0000 mg | ORAL_TABLET | Freq: Every day | ORAL | Status: DC
  Administered 2023-05-12 – 2023-05-16 (×5): 10 mg via ORAL
  Filled 2023-05-12 (×5): qty 1

## 2023-05-12 NOTE — ED Notes (Signed)
 Patient changed by this RN

## 2023-05-12 NOTE — Consult Note (Signed)
 Reason for Consult:Left hip fx Referring Physician: Ozell Poisson Time called: 9144 Time at bedside: 9071   Tanner Bond is an 68 y.o. male.  HPI: Tanner Bond fell at the [facility] where [facility]. The fall was unwitnessed and the pt has dementia and cannot reliably contribute to history. He c/o hip pain and was brought to the ED where x-rays showed a left hip fx and orthopedic surgery was consulted.   Past Medical History:  Diagnosis Date   Alcohol abuse    Dementia (HCC)    Liver dysfunction     Past Surgical History:  Procedure Laterality Date   APPENDECTOMY     as child   INTRAMEDULLARY (IM) NAIL INTERTROCHANTERIC Right 10/12/2022   Procedure: INTRAMEDULLARY (IM) NAIL INTERTROCHANTERIC;  Surgeon: Yvone Rush, MD;  Location: [facility] OR;  Service: Orthopedics;  Laterality: Right;   URETHRAL DILATION     as child    Family History  Problem Relation Age of Onset   Dementia Mother    Prostate cancer Father     Social History:  reports that he has been smoking cigars. He has never used smokeless tobacco. He reports current alcohol use. He reports that he does not use drugs.  Allergies: No Known Allergies  Medications: I have reviewed the patient's current medications.  No results found for this or any previous visit (from the past 48 hours).  CT Head Wo Contrast Result Date: 05/12/2023 CLINICAL DATA:  Unwitnessed fall.  Head and neck trauma. EXAM: CT HEAD WITHOUT CONTRAST CT CERVICAL SPINE WITHOUT CONTRAST TECHNIQUE: Multidetector CT imaging of the head and cervical spine was performed following the standard protocol without intravenous contrast. Multiplanar CT image reconstructions of the cervical spine were also generated. RADIATION DOSE REDUCTION: This exam was performed according to the departmental dose-optimization program which includes automated exposure control, adjustment of the mA and/or kV according to patient size and/or use of iterative reconstruction technique. COMPARISON:  CT  scan head from 10/12/2022 and CT scan cervical spine from 10/11/2022 FINDINGS: CT HEAD FINDINGS Brain: No evidence of acute infarction, hemorrhage, hydrocephalus, extra-axial collection or mass lesion/mass effect. There is bilateral periventricular hypodensity, which is non-specific but most likely seen in the settings of microvascular ischemic changes. Mild in extent. Otherwise normal appearance of brain parenchyma. Ventricles are normal. Cerebral volume is age appropriate. Vascular: No hyperdense vessel or unexpected calcification. Intracranial arteriosclerosis. Skull: Normal. Negative for fracture or focal lesion. Sinuses/Orbits: No acute finding. Other: Visualized mastoid air cells are unremarkable. No mastoid effusion. CT CERVICAL SPINE FINDINGS Alignment: There is loss of cervical lordosis. There is grade 1 retrolisthesis of C4 over C5, C5 over C6 and C6 over C7, most likely degenerative. This examination does not assess for ligamentous injury or stability. Skull base and vertebrae: No acute fracture. No primary bone lesion or focal pathologic process. Soft tissues and spinal canal: No prevertebral fluid or swelling. No visible canal hematoma. Disc levels: There are moderate-to-severe multilevel degenerative changes characterized by reduced intervertebral disc height (most pronounced at C5-C6 level), vacuum phenomena, facet arthropathy and marginal osteophyte formation. Upper chest: Negative. Other: None. IMPRESSION: 1. No acute intracranial abnormality. 2. No acute osseous injury or traumatic listhesis of the cervical spine. 3. Multiple other nonacute observations, as described above. Electronically Signed   By: Ree Molt M.D.   On: 05/12/2023 08:21   CT Cervical Spine Wo Contrast Result Date: 05/12/2023 CLINICAL DATA:  Unwitnessed fall.  Head and neck trauma. EXAM: CT HEAD WITHOUT CONTRAST CT CERVICAL SPINE WITHOUT CONTRAST  TECHNIQUE: Multidetector CT imaging of the head and cervical spine was  performed following the standard protocol without intravenous contrast. Multiplanar CT image reconstructions of the cervical spine were also generated. RADIATION DOSE REDUCTION: This exam was performed according to the departmental dose-optimization program which includes automated exposure control, adjustment of the mA and/or kV according to patient size and/or use of iterative reconstruction technique. COMPARISON:  CT scan head from 10/12/2022 and CT scan cervical spine from 10/11/2022 FINDINGS: CT HEAD FINDINGS Brain: No evidence of acute infarction, hemorrhage, hydrocephalus, extra-axial collection or mass lesion/mass effect. There is bilateral periventricular hypodensity, which is non-specific but most likely seen in the settings of microvascular ischemic changes. Mild in extent. Otherwise normal appearance of brain parenchyma. Ventricles are normal. Cerebral volume is age appropriate. Vascular: No hyperdense vessel or unexpected calcification. Intracranial arteriosclerosis. Skull: Normal. Negative for fracture or focal lesion. Sinuses/Orbits: No acute finding. Other: Visualized mastoid air cells are unremarkable. No mastoid effusion. CT CERVICAL SPINE FINDINGS Alignment: There is loss of cervical lordosis. There is grade 1 retrolisthesis of C4 over C5, C5 over C6 and C6 over C7, most likely degenerative. This examination does not assess for ligamentous injury or stability. Skull base and vertebrae: No acute fracture. No primary bone lesion or focal pathologic process. Soft tissues and spinal canal: No prevertebral fluid or swelling. No visible canal hematoma. Disc levels: There are moderate-to-severe multilevel degenerative changes characterized by reduced intervertebral disc height (most pronounced at C5-C6 level), vacuum phenomena, facet arthropathy and marginal osteophyte formation. Upper chest: Negative. Other: None. IMPRESSION: 1. No acute intracranial abnormality. 2. No acute osseous injury or traumatic  listhesis of the cervical spine. 3. Multiple other nonacute observations, as described above. Electronically Signed   By: Ree Molt M.D.   On: 05/12/2023 08:21   DG Hip Unilat W or Wo Pelvis 2-3 Views Left Result Date: 05/12/2023 CLINICAL DATA:  Fall.  Left leg pain. EXAM: DG HIP (WITH OR WITHOUT PELVIS) 2-3V LEFT COMPARISON:  None Available. FINDINGS: There is mildly displaced and proximally migrated transcervical fracture of left femoral neck. No other acute fracture or dislocation. No aggressive osseous lesion. Metallic hardware noted in the proximal right femur. There is small surrounding heterotopic ossification. Visualized sacral arcuate lines are unremarkable. There are changes of chronic pubic symphisitis. There are mild degenerative changes of bilateral hip joints without significant joint space narrowing. Osteophytosis of the superior acetabulum. No radiopaque foreign bodies. IMPRESSION: *Mildly displaced transcervical left femoral neck fracture. Electronically Signed   By: Ree Molt M.D.   On: 05/12/2023 08:12    Review of Systems  HENT:  Negative for ear discharge, ear pain, hearing loss and tinnitus.   Eyes:  Negative for photophobia and pain.  Respiratory:  Negative for cough and shortness of breath.   Cardiovascular:  Negative for chest pain.  Gastrointestinal:  Negative for abdominal pain, nausea and vomiting.  Genitourinary:  Negative for dysuria, flank pain, frequency and urgency.  Musculoskeletal:  Positive for arthralgias (Left hip). Negative for back pain, myalgias and neck pain.  Neurological:  Negative for dizziness and headaches.  Hematological:  Does not bruise/bleed easily.  Psychiatric/Behavioral:  The patient is not nervous/anxious.    Blood pressure 110/63, pulse 71, temperature 98.6 F (37 C), temperature source Oral, resp. rate 17, SpO2 98%. Physical Exam Constitutional:      General: He is not in acute distress.    Appearance: He is well-developed. He  is not diaphoretic.  HENT:     Head: Normocephalic  and atraumatic.  Eyes:     General: No scleral icterus.       Right eye: No discharge.        Left eye: No discharge.     Conjunctiva/sclera: Conjunctivae normal.  Cardiovascular:     Rate and Rhythm: Normal rate and regular rhythm.  Pulmonary:     Effort: Pulmonary effort is normal. No respiratory distress.  Musculoskeletal:     Cervical back: Normal range of motion.     Comments: LLE No traumatic wounds, ecchymosis, or rash  Mild TTP hip  No knee or ankle effusion  Knee stable to varus/ valgus and anterior/posterior stress  Sens DPN, SPN, TN intact  Motor EHL, ext, flex, evers 5/5  DP 2+, PT 2+, No significant edema  Skin:    General: Skin is warm and dry.  Neurological:     Mental Status: He is alert.  Psychiatric:        Mood and Affect: Mood normal.        Behavior: Behavior normal.    Assessment/Plan: Left hip fx -- Plan hip hemi tomorrow with Dr. Celena. Please keep NPO after MN.    Ozell DOROTHA Ned, PA-C Orthopedic Surgery [phone] 05/12/2023, 9:30 AM

## 2023-05-12 NOTE — ED Notes (Signed)
Pt incontinent of urine; pt cleaned, linens changed

## 2023-05-12 NOTE — ED Notes (Signed)
 Patient transported to X-ray

## 2023-05-12 NOTE — ED Provider Notes (Signed)
 [facility] Provider Note   CSN: [id number] Arrival date & time: 05/12/23  9357     History  Chief Complaint  Patient presents with   Tanner    Tanner Bond is a 68 y.o. male with past medical history seen for alcoholism in remission, hypertension, dementia, previous intraventricular hemorrhage who presents from [facility].  Patient had an unwitnessed fall around midnight last night, and facility reports that he hit his head.  He endorses some mild left leg pain, but no other complaints at this time.   Fall       Home Medications Prior to Admission medications   Medication Sig Start Date End Date Taking? Authorizing Provider  busPIRone  (BUSPAR ) 10 MG tablet Take 10 mg by mouth 3 (three) times daily.   Yes [provider]  donepezil  (ARICEPT ) 10 MG tablet Take 10 mg by mouth at bedtime.   Yes [provider]  memantine  (NAMENDA ) 5 MG tablet Take 5 mg by mouth 2 (two) times daily. 04/03/23  Yes [provider]  Multiple Vitamins-Minerals (CENTRUM SILVER 50+MEN) TABS Take 1 tablet by mouth daily.   Yes [provider]  Nutritional Supplements (NUTRITIONAL SUPPLEMENT PO) Take 120 mLs by mouth 2 (two) times daily.   Yes [provider]  Omega-3 Fatty Acids (FISH OIL ADULT GUMMIES PO) Take 1 capsule by mouth daily.   Yes [provider]  QUEtiapine  (SEROQUEL ) 25 MG tablet Take 25 mg by mouth at bedtime.   Yes [provider]  sertraline  (ZOLOFT ) 100 MG tablet Take 100 mg by mouth daily.   Yes [provider]  Vitamin D , Ergocalciferol , (DRISDOL ) 1.25 MG (50000 UNIT) CAPS capsule Take 1 capsule (50,000 Units total) by mouth every 7 (seven) days. 10/20/22  Yes Tanner Bogus, MD      Allergies    Patient has no known allergies.    Review of Systems   Review of Systems  All other systems reviewed and are negative.   Physical Exam Updated Vital Signs BP 109/68    Pulse 76   Temp 98.6 F (37 C) (Oral)   Resp 18   SpO2 94%  Physical Exam Vitals and nursing note reviewed.  Constitutional:      General: He is not in acute distress.    Appearance: Normal appearance.  HENT:     Head: Normocephalic and atraumatic.  Eyes:     General:        Right eye: No discharge.        Left eye: No discharge.  Cardiovascular:     Rate and Rhythm: Normal rate and regular rhythm.  Pulmonary:     Effort: Pulmonary effort is normal. No respiratory distress.  Musculoskeletal:        General: No deformity.     Comments: Mild ttp without stepoff, he does have an external rotation of the left lower extremity.  Decree strength of the left lower extremity secondary to pain, intact strength 5/5 of right lower extremity, and bilateral upper extremities.  Skin:    General: Skin is warm and dry.  Neurological:     Mental Status: He is alert. Mental status is at baseline.     Comments: Moves all 4 limbs spontaneously although hesitant to move left leg secondary to pain, likely fracture, CN II through XII grossly intact, intact sensation throughout.  Psychiatric:        Mood and Affect: Mood normal.  Behavior: Behavior normal.     ED Results / Procedures / Treatments   Labs (all labs ordered are listed, but only abnormal results are displayed) Labs Reviewed  CBC - Abnormal; Notable for the following components:      Result Value   WBC 11.0 (*)    RBC 3.52 (*)    Hemoglobin 12.1 (*)    HCT 35.9 (*)    MCV 102.0 (*)    MCH 34.4 (*)    All other components within normal limits  BASIC METABOLIC PANEL - Abnormal; Notable for the following components:   Glucose, Bld 108 (*)    Calcium 8.7 (*)    All other components within normal limits  HIV ANTIBODY (ROUTINE TESTING W REFLEX)    EKG None  Radiology CT Head Wo Contrast Result Date: 05/12/2023 CLINICAL DATA:  Unwitnessed fall.  Head and neck trauma. EXAM: CT HEAD WITHOUT CONTRAST CT CERVICAL SPINE WITHOUT  CONTRAST TECHNIQUE: Multidetector CT imaging of the head and cervical spine was performed following the standard protocol without intravenous contrast. Multiplanar CT image reconstructions of the cervical spine were also generated. RADIATION DOSE REDUCTION: This exam was performed according to the departmental dose-optimization program which includes automated exposure control, adjustment of the mA and/or kV according to patient size and/or use of iterative reconstruction technique. COMPARISON:  CT scan head from 10/12/2022 and CT scan cervical spine from 10/11/2022 FINDINGS: CT HEAD FINDINGS Brain: No evidence of acute infarction, hemorrhage, hydrocephalus, extra-axial collection or mass lesion/mass effect. There is bilateral periventricular hypodensity, which is non-specific but most likely seen in the settings of microvascular ischemic changes. Mild in extent. Otherwise normal appearance of brain parenchyma. Ventricles are normal. Cerebral volume is age appropriate. Vascular: No hyperdense vessel or unexpected calcification. Intracranial arteriosclerosis. Skull: Normal. Negative for fracture or focal lesion. Sinuses/Orbits: No acute finding. Other: Visualized mastoid air cells are unremarkable. No mastoid effusion. CT CERVICAL SPINE FINDINGS Alignment: There is loss of cervical lordosis. There is grade 1 retrolisthesis of C4 over C5, C5 over C6 and C6 over C7, most likely degenerative. This examination does not assess for ligamentous injury or stability. Skull base and vertebrae: No acute fracture. No primary bone lesion or focal pathologic process. Soft tissues and spinal canal: No prevertebral fluid or swelling. No visible canal hematoma. Disc levels: There are moderate-to-severe multilevel degenerative changes characterized by reduced intervertebral disc height (most pronounced at C5-C6 level), vacuum phenomena, facet arthropathy and marginal osteophyte formation. Upper chest: Negative. Other: None.  IMPRESSION: 1. No acute intracranial abnormality. 2. No acute osseous injury or traumatic listhesis of the cervical spine. 3. Multiple other nonacute observations, as described above. Electronically Signed   By: Ree Molt M.D.   On: 05/12/2023 08:21   CT Cervical Spine Wo Contrast Result Date: 05/12/2023 CLINICAL DATA:  Unwitnessed fall.  Head and neck trauma. EXAM: CT HEAD WITHOUT CONTRAST CT CERVICAL SPINE WITHOUT CONTRAST TECHNIQUE: Multidetector CT imaging of the head and cervical spine was performed following the standard protocol without intravenous contrast. Multiplanar CT image reconstructions of the cervical spine were also generated. RADIATION DOSE REDUCTION: This exam was performed according to the departmental dose-optimization program which includes automated exposure control, adjustment of the mA and/or kV according to patient size and/or use of iterative reconstruction technique. COMPARISON:  CT scan head from 10/12/2022 and CT scan cervical spine from 10/11/2022 FINDINGS: CT HEAD FINDINGS Brain: No evidence of acute infarction, hemorrhage, hydrocephalus, extra-axial collection or mass lesion/mass effect. There is bilateral periventricular hypodensity, which  is non-specific but most likely seen in the settings of microvascular ischemic changes. Mild in extent. Otherwise normal appearance of brain parenchyma. Ventricles are normal. Cerebral volume is age appropriate. Vascular: No hyperdense vessel or unexpected calcification. Intracranial arteriosclerosis. Skull: Normal. Negative for fracture or focal lesion. Sinuses/Orbits: No acute finding. Other: Visualized mastoid air cells are unremarkable. No mastoid effusion. CT CERVICAL SPINE FINDINGS Alignment: There is loss of cervical lordosis. There is grade 1 retrolisthesis of C4 over C5, C5 over C6 and C6 over C7, most likely degenerative. This examination does not assess for ligamentous injury or stability. Skull base and vertebrae: No acute  fracture. No primary bone lesion or focal pathologic process. Soft tissues and spinal canal: No prevertebral fluid or swelling. No visible canal hematoma. Disc levels: There are moderate-to-severe multilevel degenerative changes characterized by reduced intervertebral disc height (most pronounced at C5-C6 level), vacuum phenomena, facet arthropathy and marginal osteophyte formation. Upper chest: Negative. Other: None. IMPRESSION: 1. No acute intracranial abnormality. 2. No acute osseous injury or traumatic listhesis of the cervical spine. 3. Multiple other nonacute observations, as described above. Electronically Signed   By: Ree Molt M.D.   On: 05/12/2023 08:21   DG Hip Unilat W or Wo Pelvis 2-3 Views Left Result Date: 05/12/2023 CLINICAL DATA:  Fall.  Left leg pain. EXAM: DG HIP (WITH OR WITHOUT PELVIS) 2-3V LEFT COMPARISON:  None Available. FINDINGS: There is mildly displaced and proximally migrated transcervical fracture of left femoral neck. No other acute fracture or dislocation. No aggressive osseous lesion. Metallic hardware noted in the proximal right femur. There is small surrounding heterotopic ossification. Visualized sacral arcuate lines are unremarkable. There are changes of chronic pubic symphisitis. There are mild degenerative changes of bilateral hip joints without significant joint space narrowing. Osteophytosis of the superior acetabulum. No radiopaque foreign bodies. IMPRESSION: *Mildly displaced transcervical left femoral neck fracture. Electronically Signed   By: Ree Molt M.D.   On: 05/12/2023 08:12    Procedures Procedures    Medications Ordered in ED Medications  chlorhexidine  (HIBICLENS ) 4 % liquid 4 Application (has no administration in time range)  povidone-iodine  10 % swab 2 Application (has no administration in time range)  ceFAZolin  (ANCEF ) IVPB 2g/100 mL premix (has no administration in time range)  tranexamic acid  (CYKLOKAPRON ) IVPB 1,000 mg (has no  administration in time range)  sertraline  (ZOLOFT ) tablet 100 mg (has no administration in time range)  QUEtiapine  (SEROQUEL ) tablet 25 mg (has no administration in time range)  memantine  (NAMENDA ) tablet 5 mg (has no administration in time range)  donepezil  (ARICEPT ) tablet 10 mg (has no administration in time range)  busPIRone  (BUSPAR ) tablet 10 mg (has no administration in time range)  HYDROcodone -acetaminophen  (NORCO/VICODIN) 5-325 MG per tablet 1 tablet (has no administration in time range)  polyethylene glycol (MIRALAX  / GLYCOLAX ) packet 17 g (has no administration in time range)  HYDROmorphone  (DILAUDID ) injection 0.5 mg (has no administration in time range)  acetaminophen  (TYLENOL ) tablet 650 mg (has no administration in time range)    ED Course/ Medical Decision Making/ A&P                                 Medical Decision Making Amount and/or Complexity of Data Reviewed Labs: ordered. Radiology: ordered.  Risk Decision regarding hospitalization.   This patient is a 68 y.o. male  who presents to the ED for concern of fall, head injury, hip pain.  Differential diagnoses prior to evaluation: The emergent differential diagnosis includes, but is not limited to,  epidural hematoma, subdural hematoma, skull fracture, subarachnoid hemorrhage, unstable cervical spine fracture, concussion vs other MSK injury   . This is not an exhaustive differential.   Past Medical History / Co-morbidities / Social History: Alcoholism in remission, hypertension, dementia  Additional history: Chart reviewed. Pertinent results include: Reviewed lab work, imaging from recent previous emergency department visits  Physical Exam: Physical exam performed. The pertinent findings include: Focal tenderness to patient, external rotation of the left hip, dementia seems to be at baseline with no focal neurologic abnormalities.  Lab Tests/Imaging studies: I personally interpreted labs/imaging and the  pertinent results include: Notable for mild leukocytosis, likely secondary to trauma, mild anemia, hemoglobin 12.1.  BMP unremarkable.  [place] interpreted CT of the head and C-spine as well as plain film radiograph of the left hip which shows left-sided femoral neck fracture, no evidence of acute intracranial abnormality. I agree with the radiologist interpretation.  Medications: Spoke with Ozell Purchase, PA-C with orthopedics who will help to manage this patient from orthopedic perspective, recommends hospital admission, spoke with Dr. Seena who agrees to hospital admission at this time.   Disposition: After consideration of the diagnostic results and the patients response to treatment, I feel that patient after hospital admission for new hip fracture.   Final Clinical Impression(s) / ED Diagnoses Final diagnoses:  None    Rx / DC Orders ED Discharge Orders     None         Rosan Sherlean DEL, PA-C 05/12/23 1513    Theadore Ozell HERO, MD 05/16/23 [phone]

## 2023-05-12 NOTE — ED Triage Notes (Signed)
 Per [facility], pt from [facility], pt had an unwitnessed fall around 12:30 last night, "they heard him hit his head."  No thinners.  Left leg pain, no complaints at this time.  Baseline dementia.    122/80 65 HR 16RR

## 2023-05-12 NOTE — ED Notes (Signed)
 ED TO INPATIENT HANDOFF REPORT  ED Nurse Name and Phone #:   S Name/Age/Gender Tanner Bond 68 y.o. male Room/Bed: H009C/H009C  Code Status   Code Status: Limited: Do not attempt resuscitation (DNR) -DNR-LIMITED -Do Not Intubate/DNI   Home/SNF/Other Skilled nursing facility Patient oriented to: self, place, and situation Is this baseline? Yes   Triage Complete: Triage complete  Chief Complaint Closed displaced fracture of neck of left femur (HCC) [S72.002A]  Triage Note Per [facility], pt from [facility], pt had an unwitnessed fall around 12:30 last night, they heard him hit his head.  No thinners.  Left leg pain, no complaints at this time.  Baseline dementia.    122/80 65 HR 16RR   Allergies No Known Allergies  Level of Care/Admitting Diagnosis ED Disposition     ED Disposition  Admit   Condition  --   Comment  Hospital Area: [facility] [[id number]]  Level of Care: Med-Surg [16]  May admit patient to [facility] or [facility] if equivalent level of care is available:: No  Covid Evaluation: Asymptomatic - no recent exposure (last 10 days) testing not required  Diagnosis: Closed displaced fracture of neck of left femur [facility]) [[id number]]  Admitting Physician: MELVIN, ALEXANDER B [[id number]]  Attending Physician: MELVIN, ALEXANDER B [[id number]]  Certification:: I certify this patient will need inpatient services for at least 2 midnights  Expected Medical Readiness: 05/14/2023          B Medical/Surgery History Past Medical History:  Diagnosis Date   Alcohol abuse    Dementia (HCC)    Liver dysfunction    Past Surgical History:  Procedure Laterality Date   APPENDECTOMY     as child   INTRAMEDULLARY (IM) NAIL INTERTROCHANTERIC Right 10/12/2022   Procedure: INTRAMEDULLARY (IM) NAIL INTERTROCHANTERIC;  Surgeon: Yvone Rush, MD;  Location: [facility] OR;  Service: Orthopedics;  Laterality: Right;   URETHRAL DILATION     as child     A IV  Location/Drains/Wounds Patient Lines/Drains/Airways Status     Active Line/Drains/Airways     Name Placement date Placement time Site Days   Peripheral IV 05/12/23 20 G Anterior;Left Forearm 05/12/23  1141  Forearm  less than 1   External Urinary Catheter 05/12/23  1142  --  less than 1            Intake/Output Last 24 hours No intake or output data in the 24 hours ending 05/12/23 1335  Labs/Imaging Results for orders placed or performed during the hospital encounter of 05/12/23 (from the past 48 hours)  CBC     Status: Abnormal   Collection Time: 05/12/23 10:13 AM  Result Value Ref Range   WBC 11.0 (H) 4.0 - 10.5 K/uL   RBC 3.52 (L) 4.22 - 5.81 MIL/uL   Hemoglobin 12.1 (L) 13.0 - 17.0 g/dL   HCT 64.0 (L) 60.9 - 47.9 %   MCV 102.0 (H) 80.0 - 100.0 fL   MCH 34.4 (H) 26.0 - 34.0 pg   MCHC 33.7 30.0 - 36.0 g/dL   RDW 87.5 88.4 - 84.4 %   Platelets 153 150 - 400 K/uL   nRBC 0.0 0.0 - 0.2 %    Comment: Performed at [facility], [address] [place], [place] [address]  Basic metabolic panel     Status: Abnormal   Collection Time: 05/12/23 10:13 AM  Result Value Ref Range   Sodium 140 135 - 145 mmol/L   Potassium 3.6 3.5 - 5.1 mmol/L  Chloride 106 98 - 111 mmol/L   CO2 26 22 - 32 mmol/L   Glucose, Bld 108 (H) 70 - 99 mg/dL    Comment: Glucose reference range applies only to samples taken after fasting for at least 8 hours.   BUN 17 8 - 23 mg/dL   Creatinine, Ser 9.26 0.61 - 1.24 mg/dL   Calcium 8.7 (L) 8.9 - 10.3 mg/dL   GFR, Estimated >39 >39 mL/min    Comment: (NOTE) Calculated using the CKD-EPI Creatinine Equation (2021)    Anion gap 8 5 - 15    Comment: Performed at [facility], [address] [place], [place] [address]   CT Head Wo Contrast Result Date: 05/12/2023 CLINICAL DATA:  Unwitnessed fall.  Head and neck trauma. EXAM: CT HEAD WITHOUT CONTRAST CT CERVICAL SPINE WITHOUT CONTRAST TECHNIQUE: Multidetector CT imaging of the head and cervical  spine was performed following the standard protocol without intravenous contrast. Multiplanar CT image reconstructions of the cervical spine were also generated. RADIATION DOSE REDUCTION: This exam was performed according to the departmental dose-optimization program which includes automated exposure control, adjustment of the mA and/or kV according to patient size and/or use of iterative reconstruction technique. COMPARISON:  CT scan head from 10/12/2022 and CT scan cervical spine from 10/11/2022 FINDINGS: CT HEAD FINDINGS Brain: No evidence of acute infarction, hemorrhage, hydrocephalus, extra-axial collection or mass lesion/mass effect. There is bilateral periventricular hypodensity, which is non-specific but most likely seen in the settings of microvascular ischemic changes. Mild in extent. Otherwise normal appearance of brain parenchyma. Ventricles are normal. Cerebral volume is age appropriate. Vascular: No hyperdense vessel or unexpected calcification. Intracranial arteriosclerosis. Skull: Normal. Negative for fracture or focal lesion. Sinuses/Orbits: No acute finding. Other: Visualized mastoid air cells are unremarkable. No mastoid effusion. CT CERVICAL SPINE FINDINGS Alignment: There is loss of cervical lordosis. There is grade 1 retrolisthesis of C4 over C5, C5 over C6 and C6 over C7, most likely degenerative. This examination does not assess for ligamentous injury or stability. Skull base and vertebrae: No acute fracture. No primary bone lesion or focal pathologic process. Soft tissues and spinal canal: No prevertebral fluid or swelling. No visible canal hematoma. Disc levels: There are moderate-to-severe multilevel degenerative changes characterized by reduced intervertebral disc height (most pronounced at C5-C6 level), vacuum phenomena, facet arthropathy and marginal osteophyte formation. Upper chest: Negative. Other: None. IMPRESSION: 1. No acute intracranial abnormality. 2. No acute osseous injury or  traumatic listhesis of the cervical spine. 3. Multiple other nonacute observations, as described above. Electronically Signed   By: Ree Molt M.D.   On: 05/12/2023 08:21   CT Cervical Spine Wo Contrast Result Date: 05/12/2023 CLINICAL DATA:  Unwitnessed fall.  Head and neck trauma. EXAM: CT HEAD WITHOUT CONTRAST CT CERVICAL SPINE WITHOUT CONTRAST TECHNIQUE: Multidetector CT imaging of the head and cervical spine was performed following the standard protocol without intravenous contrast. Multiplanar CT image reconstructions of the cervical spine were also generated. RADIATION DOSE REDUCTION: This exam was performed according to the departmental dose-optimization program which includes automated exposure control, adjustment of the mA and/or kV according to patient size and/or use of iterative reconstruction technique. COMPARISON:  CT scan head from 10/12/2022 and CT scan cervical spine from 10/11/2022 FINDINGS: CT HEAD FINDINGS Brain: No evidence of acute infarction, hemorrhage, hydrocephalus, extra-axial collection or mass lesion/mass effect. There is bilateral periventricular hypodensity, which is non-specific but most likely seen in the settings of microvascular ischemic changes. Mild in extent. Otherwise normal appearance  of brain parenchyma. Ventricles are normal. Cerebral volume is age appropriate. Vascular: No hyperdense vessel or unexpected calcification. Intracranial arteriosclerosis. Skull: Normal. Negative for fracture or focal lesion. Sinuses/Orbits: No acute finding. Other: Visualized mastoid air cells are unremarkable. No mastoid effusion. CT CERVICAL SPINE FINDINGS Alignment: There is loss of cervical lordosis. There is grade 1 retrolisthesis of C4 over C5, C5 over C6 and C6 over C7, most likely degenerative. This examination does not assess for ligamentous injury or stability. Skull base and vertebrae: No acute fracture. No primary bone lesion or focal pathologic process. Soft tissues and spinal  canal: No prevertebral fluid or swelling. No visible canal hematoma. Disc levels: There are moderate-to-severe multilevel degenerative changes characterized by reduced intervertebral disc height (most pronounced at C5-C6 level), vacuum phenomena, facet arthropathy and marginal osteophyte formation. Upper chest: Negative. Other: None. IMPRESSION: 1. No acute intracranial abnormality. 2. No acute osseous injury or traumatic listhesis of the cervical spine. 3. Multiple other nonacute observations, as described above. Electronically Signed   By: Ree Molt M.D.   On: 05/12/2023 08:21   DG Hip Unilat W or Wo Pelvis 2-3 Views Left Result Date: 05/12/2023 CLINICAL DATA:  Fall.  Left leg pain. EXAM: DG HIP (WITH OR WITHOUT PELVIS) 2-3V LEFT COMPARISON:  None Available. FINDINGS: There is mildly displaced and proximally migrated transcervical fracture of left femoral neck. No other acute fracture or dislocation. No aggressive osseous lesion. Metallic hardware noted in the proximal right femur. There is small surrounding heterotopic ossification. Visualized sacral arcuate lines are unremarkable. There are changes of chronic pubic symphisitis. There are mild degenerative changes of bilateral hip joints without significant joint space narrowing. Osteophytosis of the superior acetabulum. No radiopaque foreign bodies. IMPRESSION: *Mildly displaced transcervical left femoral neck fracture. Electronically Signed   By: Ree Molt M.D.   On: 05/12/2023 08:12    Pending Labs Unresulted Labs (From admission, onward)     Start     Ordered   05/13/23 0500  CBC  Tomorrow morning,   R        05/12/23 1243   05/13/23 0500  Basic metabolic panel  Tomorrow morning,   R        05/12/23 1243   05/13/23 0500  Type and screen [facility]  Once,   R       Comments: [facility]    05/12/23 1243   05/12/23 1236  HIV Antibody (routine testing w rflx)  (HIV Antibody (Routine testing w reflex)  panel)  Once,   R        05/12/23 1243            Vitals/Pain Today's Vitals   05/12/23 0643 05/12/23 1149 05/12/23 1334  BP: 110/63 109/68   Pulse: 71 76   Resp: 17 18   Temp: 98.6 F (37 C) 98.6 F (37 C)   TempSrc: Oral Oral   SpO2: 98% 94%   PainSc:   0-No pain    Isolation Precautions No active isolations  Medications Medications  chlorhexidine  (HIBICLENS ) 4 % liquid 4 Application (has no administration in time range)  povidone-iodine  10 % swab 2 Application (has no administration in time range)  ceFAZolin  (ANCEF ) IVPB 2g/100 mL premix (has no administration in time range)  tranexamic acid  (CYKLOKAPRON ) IVPB 1,000 mg (has no administration in time range)  sertraline  (ZOLOFT ) tablet 100 mg (has no administration in time range)  QUEtiapine  (SEROQUEL ) tablet 25 mg (has no administration in time range)  memantine  (NAMENDA ) tablet 5 mg (has no administration in time range)  donepezil  (ARICEPT ) tablet 10 mg (has no administration in time range)  busPIRone  (BUSPAR ) tablet 10 mg (has no administration in time range)  HYDROcodone -acetaminophen  (NORCO/VICODIN) 5-325 MG per tablet 1 tablet (has no administration in time range)  polyethylene glycol (MIRALAX  / GLYCOLAX ) packet 17 g (has no administration in time range)  HYDROmorphone  (DILAUDID ) injection 0.5 mg (has no administration in time range)  acetaminophen  (TYLENOL ) tablet 650 mg (has no administration in time range)    Mobility non-ambulatory     Focused Assessments    R Recommendations: See Admitting Provider Note  Report given to:   Additional Notes:

## 2023-05-12 NOTE — H&P (Signed)
 History and Physical   Tanner Bond FMW:[id number] DOB: 1955-08-23 DOA: 05/12/2023  PCP: Henry Tanda FORBES Mickey., MD   Patient coming from: [facility]  Chief Complaint: [place]  HPI: Tanner Bond is a 68 y.o. male with medical history significant of hypertension, dementia, BPH, alcohol use, fatty liver presenting after fall at facility.  History obtained assistance of chart review given patient's history of dementia.  Patient reportedly had an unwitnessed fall around midnight at facility.  Reportedly he did hit his head and has been complaining of left leg pain.  Denies significant pain currently.  Otherwise unable to provide detailed review of systems due to baseline dementia.  No other complaints low.  ED Course: Vitals in the ED stable.  Lab workup included BMP with glucose 108, calcium 8.7.  CBC showed mild leukocytosis to 11, hemoglobin stable at 12.1.  Imaging workup showed left hip x-ray with mild displaced transcervical femur neck fracture.  CT head and CT C-spine showed no acute abnormality.  Orthopedics consulted and plan for surgery in the morning asked for medicine admission.  Review of Systems: Limited by patient's dementia as above.  Past Medical History:  Diagnosis Date   Alcohol abuse    Dementia (HCC)    Liver dysfunction     Past Surgical History:  Procedure Laterality Date   APPENDECTOMY     as child   INTRAMEDULLARY (IM) NAIL INTERTROCHANTERIC Right 10/12/2022   Procedure: INTRAMEDULLARY (IM) NAIL INTERTROCHANTERIC;  Surgeon: Yvone Rush, MD;  Location: [facility] OR;  Service: Orthopedics;  Laterality: Right;   URETHRAL DILATION     as child    Social History  reports that he has been smoking cigars. He has never used smokeless tobacco. He reports current alcohol use. He reports that he does not use drugs.  No Known Allergies  Family History  Problem Relation Age of Onset   Dementia Mother    Prostate cancer Father   Reviewed on admission  Prior to  Admission medications   Medication Sig Start Date End Date Taking? Authorizing Provider  busPIRone  (BUSPAR ) 10 MG tablet Take 10 mg by mouth 3 (three) times daily.   Yes [provider]  donepezil  (ARICEPT ) 10 MG tablet Take 10 mg by mouth at bedtime.   Yes [provider]  memantine  (NAMENDA ) 5 MG tablet Take 5 mg by mouth 2 (two) times daily. 04/03/23  Yes [provider]  Multiple Vitamins-Minerals (CENTRUM SILVER 50+MEN) TABS Take 1 tablet by mouth daily.   Yes [provider]  Nutritional Supplements (NUTRITIONAL SUPPLEMENT PO) Take 120 mLs by mouth 2 (two) times daily.   Yes [provider]  Omega-3 Fatty Acids (FISH OIL ADULT GUMMIES PO) Take 1 capsule by mouth daily.   Yes [provider]  QUEtiapine  (SEROQUEL ) 25 MG tablet Take 25 mg by mouth at bedtime.   Yes [provider]  sertraline  (ZOLOFT ) 100 MG tablet Take 100 mg by mouth daily.   Yes [provider]  Vitamin D , Ergocalciferol , (DRISDOL ) 1.25 MG (50000 UNIT) CAPS capsule Take 1 capsule (50,000 Units total) by mouth every 7 (seven) days. 10/20/22  Yes Leotis Bogus, MD    Physical Exam: Vitals:   05/12/23 0643 05/12/23 1149  BP: 110/63 109/68  Pulse: 71 76  Resp: 17 18  Temp: 98.6 F (37 C) 98.6 F (37 C)  TempSrc: Oral Oral  SpO2: 98% 94%    Physical Exam Constitutional:      General: He is not in acute distress.  Appearance: Normal appearance.     Comments: Pleasantly Demented  HENT:     Head: Normocephalic and atraumatic.     Mouth/Throat:     Mouth: Mucous membranes are moist.     Pharynx: Oropharynx is clear.  Eyes:     Extraocular Movements: Extraocular movements intact.     Pupils: Pupils are equal, round, and reactive to light.  Cardiovascular:     Rate and Rhythm: Normal rate and regular rhythm.     Pulses: Normal pulses.     Heart sounds: Normal heart sounds.  Pulmonary:     Effort: Pulmonary effort is normal. No  respiratory distress.     Breath sounds: Normal breath sounds.  Abdominal:     General: Bowel sounds are normal. There is no distension.     Palpations: Abdomen is soft.     Tenderness: There is no abdominal tenderness.  Musculoskeletal:        General: No swelling or deformity.     Comments: Externally rotated left lower extremity  Skin:    General: Skin is warm and dry.  Neurological:     General: No focal deficit present.     Mental Status: Mental status is at baseline.     Labs on Admission: I have personally reviewed following labs and imaging studies  CBC: Recent Labs  Lab 05/12/23 1013  WBC 11.0*  HGB 12.1*  HCT 35.9*  MCV 102.0*  PLT 153    Basic Metabolic Panel: Recent Labs  Lab 05/12/23 1013  NA 140  K 3.6  CL 106  CO2 26  GLUCOSE 108*  BUN 17  CREATININE 0.73  CALCIUM 8.7*    GFR: CrCl cannot be calculated (Unknown ideal weight.).  Liver Function Tests: No results for input(s): AST, ALT, ALKPHOS, BILITOT, PROT, ALBUMIN in the last 168 hours.  Urine analysis:    Component Value Date/Time   BILIRUBINUR Negative 07/05/2017 1140   PROTEINUR Negative 07/05/2017 1140   UROBILINOGEN 0.2 07/05/2017 1140   NITRITE Negative 07/05/2017 1140   LEUKOCYTESUR Negative 07/05/2017 1140    Radiological Exams on Admission: CT Head Wo Contrast Result Date: 05/12/2023 CLINICAL DATA:  Unwitnessed fall.  Head and neck trauma. EXAM: CT HEAD WITHOUT CONTRAST CT CERVICAL SPINE WITHOUT CONTRAST TECHNIQUE: Multidetector CT imaging of the head and cervical spine was performed following the standard protocol without intravenous contrast. Multiplanar CT image reconstructions of the cervical spine were also generated. RADIATION DOSE REDUCTION: This exam was performed according to the departmental dose-optimization program which includes automated exposure control, adjustment of the mA and/or kV according to patient size and/or use of iterative reconstruction  technique. COMPARISON:  CT scan head from 10/12/2022 and CT scan cervical spine from 10/11/2022 FINDINGS: CT HEAD FINDINGS Brain: No evidence of acute infarction, hemorrhage, hydrocephalus, extra-axial collection or mass lesion/mass effect. There is bilateral periventricular hypodensity, which is non-specific but most likely seen in the settings of microvascular ischemic changes. Mild in extent. Otherwise normal appearance of brain parenchyma. Ventricles are normal. Cerebral volume is age appropriate. Vascular: No hyperdense vessel or unexpected calcification. Intracranial arteriosclerosis. Skull: Normal. Negative for fracture or focal lesion. Sinuses/Orbits: No acute finding. Other: Visualized mastoid air cells are unremarkable. No mastoid effusion. CT CERVICAL SPINE FINDINGS Alignment: There is loss of cervical lordosis. There is grade 1 retrolisthesis of C4 over C5, C5 over C6 and C6 over C7, most likely degenerative. This examination does not assess for ligamentous injury or stability. Skull base and vertebrae: No acute fracture. No  primary bone lesion or focal pathologic process. Soft tissues and spinal canal: No prevertebral fluid or swelling. No visible canal hematoma. Disc levels: There are moderate-to-severe multilevel degenerative changes characterized by reduced intervertebral disc height (most pronounced at C5-C6 level), vacuum phenomena, facet arthropathy and marginal osteophyte formation. Upper chest: Negative. Other: None. IMPRESSION: 1. No acute intracranial abnormality. 2. No acute osseous injury or traumatic listhesis of the cervical spine. 3. Multiple other nonacute observations, as described above. Electronically Signed   By: Ree Molt M.D.   On: 05/12/2023 08:21   CT Cervical Spine Wo Contrast Result Date: 05/12/2023 CLINICAL DATA:  Unwitnessed fall.  Head and neck trauma. EXAM: CT HEAD WITHOUT CONTRAST CT CERVICAL SPINE WITHOUT CONTRAST TECHNIQUE: Multidetector CT imaging of the head and  cervical spine was performed following the standard protocol without intravenous contrast. Multiplanar CT image reconstructions of the cervical spine were also generated. RADIATION DOSE REDUCTION: This exam was performed according to the departmental dose-optimization program which includes automated exposure control, adjustment of the mA and/or kV according to patient size and/or use of iterative reconstruction technique. COMPARISON:  CT scan head from 10/12/2022 and CT scan cervical spine from 10/11/2022 FINDINGS: CT HEAD FINDINGS Brain: No evidence of acute infarction, hemorrhage, hydrocephalus, extra-axial collection or mass lesion/mass effect. There is bilateral periventricular hypodensity, which is non-specific but most likely seen in the settings of microvascular ischemic changes. Mild in extent. Otherwise normal appearance of brain parenchyma. Ventricles are normal. Cerebral volume is age appropriate. Vascular: No hyperdense vessel or unexpected calcification. Intracranial arteriosclerosis. Skull: Normal. Negative for fracture or focal lesion. Sinuses/Orbits: No acute finding. Other: Visualized mastoid air cells are unremarkable. No mastoid effusion. CT CERVICAL SPINE FINDINGS Alignment: There is loss of cervical lordosis. There is grade 1 retrolisthesis of C4 over C5, C5 over C6 and C6 over C7, most likely degenerative. This examination does not assess for ligamentous injury or stability. Skull base and vertebrae: No acute fracture. No primary bone lesion or focal pathologic process. Soft tissues and spinal canal: No prevertebral fluid or swelling. No visible canal hematoma. Disc levels: There are moderate-to-severe multilevel degenerative changes characterized by reduced intervertebral disc height (most pronounced at C5-C6 level), vacuum phenomena, facet arthropathy and marginal osteophyte formation. Upper chest: Negative. Other: None. IMPRESSION: 1. No acute intracranial abnormality. 2. No acute osseous  injury or traumatic listhesis of the cervical spine. 3. Multiple other nonacute observations, as described above. Electronically Signed   By: Ree Molt M.D.   On: 05/12/2023 08:21   DG Hip Unilat W or Wo Pelvis 2-3 Views Left Result Date: 05/12/2023 CLINICAL DATA:  Fall.  Left leg pain. EXAM: DG HIP (WITH OR WITHOUT PELVIS) 2-3V LEFT COMPARISON:  None Available. FINDINGS: There is mildly displaced and proximally migrated transcervical fracture of left femoral neck. No other acute fracture or dislocation. No aggressive osseous lesion. Metallic hardware noted in the proximal right femur. There is small surrounding heterotopic ossification. Visualized sacral arcuate lines are unremarkable. There are changes of chronic pubic symphisitis. There are mild degenerative changes of bilateral hip joints without significant joint space narrowing. Osteophytosis of the superior acetabulum. No radiopaque foreign bodies. IMPRESSION: *Mildly displaced transcervical left femoral neck fracture. Electronically Signed   By: Ree Molt M.D.   On: 05/12/2023 08:12   EKG: Not yet performed  Assessment/Plan Principal Problem:   Closed displaced fracture of neck of left femur (HCC) Active Problems:   Alcoholism in remission (HCC)   Hypertension   Alcoholic liver disease (HCC)  BPH associated with nocturia   Dementia with behavioral disturbance (HCC)   Left femur neck fracture Fall > Unwitnessed fall at facility.  Did hit his head but imaging negative for any intracranial or cervical abnormality. > The been complaining of left leg pain not yet requiring significant pain medication.  X-ray did show a mildly displaced transcervical femur neck fracture. > Orthopedics consulted and plan for surgery in the morning, request n.p.o. midnight. - Monitor MedSurg unit with continuous pulse ox - Appreciate orthopedic surgery recommendations and assistance - Hip fracture protocol - As needed Norco for moderate to severe  pain with low-dose Dilaudid  for breakthrough severe pain to start later this evening - N.p.o. at midnight - Trend labs - A.m. type and screen  Dementia - Continue home sertraline , BuSpar , donepezil , Namenda , Seroquel    Anemia Leukocytosis > Mild stable anemia with hemoglobin 12.1.  Mild leukocytosis to 11 suspected to be reactive in the setting of fall and fracture. - Trend CBC  DVT prophylaxis: SCDs Code Status:   DNR/DNI Family Communication:  Updated at bedside  Disposition Plan:   Patient is from:  [facility]  Anticipated DC to:  As above  Anticipated DC date:  2-3 days  Anticipated DC barriers: None  Consults called:  Orthopedic surgery Admission status:  Inpatient, MedSurg  Severity of Illness: The appropriate patient status for this patient is INPATIENT. Inpatient status is judged to be reasonable and necessary in order to provide the required intensity of service to ensure the patient's safety. The patient's presenting symptoms, physical exam findings, and initial radiographic and laboratory data in the context of their chronic comorbidities is felt to place them at high risk for further clinical deterioration. Furthermore, it is not anticipated that the patient will be medically stable for discharge from the hospital within 2 midnights of admission.   * I certify that at the point of admission it is my clinical judgment that the patient will require inpatient hospital care spanning beyond 2 midnights from the point of admission due to high intensity of service, high risk for further deterioration and high frequency of surveillance required.DEWAINE Marsa KATHEE Seena MD [facility]  How to contact the [facility] Attending or Consulting provider 7A - 7P or covering provider during after hours 7P -7A, for this patient?   Check the care team in [facility] and look for a) attending/consulting [facility] provider listed and b) the [facility] team listed Log into [email or web address] and use [place]'s  universal password to access. If you do not have the password, please contact the hospital operator. Locate the [facility] provider you are looking for under [facility] and page to a number that you can be directly reached. If you still have difficulty reaching the provider, please page the [facility] (Director on Call) for the Hospitalists listed on amion for assistance.  05/12/2023, 12:44 PM

## 2023-05-13 ENCOUNTER — Inpatient Hospital Stay (HOSPITAL_COMMUNITY): Payer: No Typology Code available for payment source | Admitting: Anesthesiology

## 2023-05-13 ENCOUNTER — Inpatient Hospital Stay (HOSPITAL_COMMUNITY): Payer: No Typology Code available for payment source

## 2023-05-13 ENCOUNTER — Encounter (HOSPITAL_COMMUNITY)
Admission: EM | Disposition: A | Discharge status: Skilled Nursing Facility | Payer: Self-pay | Source: Skilled Nursing Facility | Attending: Internal Medicine

## 2023-05-13 ENCOUNTER — Other Ambulatory Visit: Payer: Self-pay

## 2023-05-13 ENCOUNTER — Encounter (HOSPITAL_COMMUNITY): Payer: Self-pay | Admitting: Internal Medicine

## 2023-05-13 DIAGNOSIS — S72002A Fracture of unspecified part of neck of left femur, initial encounter for closed fracture: Secondary | ICD-10-CM | POA: Diagnosis not present

## 2023-05-13 HISTORY — PX: HIP ARTHROPLASTY: SHX981

## 2023-05-13 LAB — CBC
HCT: 37 % — ABNORMAL LOW (ref 39.0–52.0)
HCT: 36.7 % — ABNORMAL LOW (ref 39.0–52.0)
Hemoglobin: 12.4 g/dL — ABNORMAL LOW (ref 13.0–17.0)
Hemoglobin: 12.5 g/dL — ABNORMAL LOW (ref 13.0–17.0)
MCH: 33.9 pg (ref 26.0–34.0)
MCH: 34 pg (ref 26.0–34.0)
MCHC: 33.5 g/dL (ref 30.0–36.0)
MCHC: 34.1 g/dL (ref 30.0–36.0)
MCV: 101.1 fL — ABNORMAL HIGH (ref 80.0–100.0)
MCV: 99.7 fL (ref 80.0–100.0)
Platelets: 167 10*3/uL (ref 150–400)
Platelets: 160 10*3/uL (ref 150–400)
RBC: 3.66 MIL/uL — ABNORMAL LOW (ref 4.22–5.81)
RBC: 3.68 MIL/uL — ABNORMAL LOW (ref 4.22–5.81)
RDW: 12.3 % (ref 11.5–15.5)
RDW: 12.4 % (ref 11.5–15.5)
WBC: 9.9 10*3/uL (ref 4.0–10.5)
WBC: 11.3 10*3/uL — ABNORMAL HIGH (ref 4.0–10.5)
nRBC: 0 % (ref 0.0–0.2)
nRBC: 0 % (ref 0.0–0.2)

## 2023-05-13 LAB — BASIC METABOLIC PANEL
Anion gap: 11 (ref 5–15)
BUN: 14 mg/dL (ref 8–23)
CO2: 26 mmol/L (ref 22–32)
Calcium: 8.8 mg/dL — ABNORMAL LOW (ref 8.9–10.3)
Chloride: 102 mmol/L (ref 98–111)
Creatinine, Ser: 0.79 mg/dL (ref 0.61–1.24)
GFR, Estimated: >60 mL/min (ref >60)
Glucose, Bld: 108 mg/dL — ABNORMAL HIGH (ref 70–99)
Potassium: 3.1 mmol/L — ABNORMAL LOW (ref 3.5–5.1)
Sodium: 139 mmol/L (ref 135–145)

## 2023-05-13 LAB — TYPE AND SCREEN
ABO/RH(D): O POS
Antibody Screen: NEGATIVE

## 2023-05-13 LAB — CREATININE, SERUM
Creatinine, Ser: 0.76 mg/dL (ref 0.61–1.24)
GFR, Estimated: >60 mL/min (ref >60)

## 2023-05-13 LAB — SURGICAL PCR SCREEN
MRSA, PCR: NEGATIVE
Staphylococcus aureus: POSITIVE — AB

## 2023-05-13 SURGERY — HEMIARTHROPLASTY, HIP, DIRECT ANTERIOR APPROACH, FOR FRACTURE
Anesthesia: Monitor Anesthesia Care | Site: Hip | Laterality: Left

## 2023-05-13 MED ORDER — ENOXAPARIN SODIUM 40 MG/0.4ML IJ SOSY
40.0000 mg | PREFILLED_SYRINGE | INTRAMUSCULAR | Status: DC
Start: 2023-05-14 — End: 2023-05-17
  Administered 2023-05-14 – 2023-05-17 (×4): 40 mg via SUBCUTANEOUS
  Filled 2023-05-13 (×4): qty 0.4

## 2023-05-13 MED ORDER — CHLORHEXIDINE GLUCONATE 0.12 % MT SOLN: 15.0000 mL | Freq: Once | OROMUCOSAL | Status: AC

## 2023-05-13 MED ORDER — ONDANSETRON HCL 4 MG PO TABS: 4.0000 mg | ORAL_TABLET | Freq: Four times a day (QID) | ORAL | Status: DC | PRN

## 2023-05-13 MED ORDER — FENTANYL CITRATE (PF) 250 MCG/5ML IJ SOLN
INTRAMUSCULAR | Status: DC | PRN
  Administered 2023-05-13 (×2): 25 ug via INTRAVENOUS

## 2023-05-13 MED ORDER — OXYCODONE HCL 5 MG/5ML PO SOLN: 5.0000 mg | Freq: Once | ORAL | Status: DC | PRN

## 2023-05-13 MED ORDER — TRANEXAMIC ACID-NACL 1000-0.7 MG/100ML-% IV SOLN
1000.0000 mg | Freq: Once | INTRAVENOUS | Status: AC
  Administered 2023-05-13: 1000 mg via INTRAVENOUS
  Filled 2023-05-13: qty 100

## 2023-05-13 MED ORDER — ONDANSETRON HCL 4 MG/2ML IJ SOLN: 4.0000 mg | Freq: Four times a day (QID) | INTRAMUSCULAR | Status: DC | PRN

## 2023-05-13 MED ORDER — PHENYLEPHRINE 80 MCG/ML (10ML) SYRINGE FOR IV PUSH (FOR BLOOD PRESSURE SUPPORT)
PREFILLED_SYRINGE | INTRAVENOUS | Status: DC | PRN
  Administered 2023-05-13: 80 ug via INTRAVENOUS

## 2023-05-13 MED ORDER — CHLORHEXIDINE GLUCONATE 4 % EX SOLN: 1.0000 | CUTANEOUS | 1 refills | Status: DC

## 2023-05-13 MED ORDER — ACETAMINOPHEN 160 MG/5ML PO SOLN: 1000.0000 mg | Freq: Once | ORAL | Status: DC | PRN

## 2023-05-13 MED ORDER — TRANEXAMIC ACID-NACL 1000-0.7 MG/100ML-% IV SOLN
INTRAVENOUS | Status: AC
  Filled 2023-05-13: qty 100

## 2023-05-13 MED ORDER — DEXAMETHASONE SODIUM PHOSPHATE 10 MG/ML IJ SOLN
INTRAMUSCULAR | Status: DC | PRN
  Administered 2023-05-13: 5 mg via INTRAVENOUS

## 2023-05-13 MED ORDER — CEFAZOLIN SODIUM-DEXTROSE 2-4 GM/100ML-% IV SOLN
2.0000 g | Freq: Four times a day (QID) | INTRAVENOUS | Status: AC
  Administered 2023-05-13 (×2): 2 g via INTRAVENOUS
  Filled 2023-05-13 (×2): qty 100

## 2023-05-13 MED ORDER — FENTANYL CITRATE (PF) 250 MCG/5ML IJ SOLN
INTRAMUSCULAR | Status: AC
  Filled 2023-05-13: qty 5

## 2023-05-13 MED ORDER — DEXAMETHASONE SODIUM PHOSPHATE 10 MG/ML IJ SOLN
INTRAMUSCULAR | Status: AC
  Filled 2023-05-13: qty 1

## 2023-05-13 MED ORDER — CHLORHEXIDINE GLUCONATE 0.12 % MT SOLN
OROMUCOSAL | Status: AC
  Administered 2023-05-13: 15 mL via OROMUCOSAL
  Filled 2023-05-13: qty 15

## 2023-05-13 MED ORDER — CHLORHEXIDINE GLUCONATE CLOTH 2 % EX PADS
6.0000 | MEDICATED_PAD | Freq: Every day | CUTANEOUS | Status: DC
  Administered 2023-05-15 – 2023-05-17 (×3): 6 via TOPICAL

## 2023-05-13 MED ORDER — FENTANYL CITRATE (PF) 100 MCG/2ML IJ SOLN
25.0000 ug | INTRAMUSCULAR | Status: DC | PRN
Start: 2023-05-13 — End: 2023-05-13

## 2023-05-13 MED ORDER — 0.9 % SODIUM CHLORIDE (POUR BTL) OPTIME
TOPICAL | Status: DC | PRN
  Administered 2023-05-13: 1000 mL

## 2023-05-13 MED ORDER — MUPIROCIN 2 % EX OINT
1.0000 | TOPICAL_OINTMENT | Freq: Two times a day (BID) | CUTANEOUS | Status: DC
  Administered 2023-05-13 – 2023-05-17 (×9): 1 via NASAL
  Filled 2023-05-13: qty 22

## 2023-05-13 MED ORDER — PROPOFOL 10 MG/ML IV BOLUS
INTRAVENOUS | Status: AC
  Filled 2023-05-13: qty 20

## 2023-05-13 MED ORDER — ORAL CARE MOUTH RINSE: 15.0000 mL | Freq: Once | OROMUCOSAL | Status: AC

## 2023-05-13 MED ORDER — LIDOCAINE 2% (20 MG/ML) 5 ML SYRINGE
INTRAMUSCULAR | Status: AC
  Filled 2023-05-13: qty 5

## 2023-05-13 MED ORDER — BUPIVACAINE IN DEXTROSE 0.75-8.25 % IT SOLN
INTRATHECAL | Status: DC | PRN
  Administered 2023-05-13: 1.8 mL via INTRATHECAL

## 2023-05-13 MED ORDER — ACETAMINOPHEN 10 MG/ML IV SOLN: 1000.0000 mg | Freq: Once | INTRAVENOUS | Status: DC | PRN

## 2023-05-13 MED ORDER — DOCUSATE SODIUM 100 MG PO CAPS
100.0000 mg | ORAL_CAPSULE | Freq: Two times a day (BID) | ORAL | Status: DC
  Administered 2023-05-13 – 2023-05-14 (×2): 100 mg via ORAL
  Filled 2023-05-13 (×3): qty 1

## 2023-05-13 MED ORDER — PHENYLEPHRINE HCL-NACL 20-0.9 MG/250ML-% IV SOLN
INTRAVENOUS | Status: DC | PRN
  Administered 2023-05-13: 30 ug/min via INTRAVENOUS

## 2023-05-13 MED ORDER — ACETAMINOPHEN 500 MG PO TABS: 1000.0000 mg | ORAL_TABLET | Freq: Once | ORAL | Status: DC | PRN

## 2023-05-13 MED ORDER — MENTHOL 3 MG MT LOZG: 1.0000 | LOZENGE | OROMUCOSAL | Status: DC | PRN

## 2023-05-13 MED ORDER — METOCLOPRAMIDE HCL 5 MG/ML IJ SOLN: 5.0000 mg | Freq: Three times a day (TID) | INTRAMUSCULAR | Status: DC | PRN

## 2023-05-13 MED ORDER — MUPIROCIN 2 % EX OINT: 1.0000 | TOPICAL_OINTMENT | Freq: Two times a day (BID) | CUTANEOUS | 0 refills | Status: AC

## 2023-05-13 MED ORDER — ONDANSETRON HCL 4 MG/2ML IJ SOLN
INTRAMUSCULAR | Status: DC | PRN
  Administered 2023-05-13: 4 mg via INTRAVENOUS

## 2023-05-13 MED ORDER — ONDANSETRON HCL 4 MG/2ML IJ SOLN
INTRAMUSCULAR | Status: AC
  Filled 2023-05-13: qty 2

## 2023-05-13 MED ORDER — LACTATED RINGERS IV SOLN: INTRAVENOUS | Status: DC

## 2023-05-13 MED ORDER — SODIUM CHLORIDE 0.9 % IR SOLN
Status: DC | PRN
  Administered 2023-05-13: 3000 mL

## 2023-05-13 MED ORDER — ROCURONIUM BROMIDE 10 MG/ML (PF) SYRINGE
PREFILLED_SYRINGE | INTRAVENOUS | Status: AC
  Filled 2023-05-13: qty 10

## 2023-05-13 MED ORDER — EPHEDRINE SULFATE-NACL 50-0.9 MG/10ML-% IV SOSY
PREFILLED_SYRINGE | INTRAVENOUS | Status: DC | PRN
  Administered 2023-05-13: 5 mg via INTRAVENOUS

## 2023-05-13 MED ORDER — LORAZEPAM 2 MG/ML IJ SOLN
1.0000 mg | Freq: Four times a day (QID) | INTRAMUSCULAR | Status: DC | PRN
  Administered 2023-05-13 – 2023-05-15 (×3): 1 mg via INTRAVENOUS
  Filled 2023-05-13 (×3): qty 1

## 2023-05-13 MED ORDER — OXYCODONE HCL 5 MG PO TABS: 5.0000 mg | ORAL_TABLET | Freq: Once | ORAL | Status: DC | PRN

## 2023-05-13 MED ORDER — PHENOL 1.4 % MT LIQD: 1.0000 | OROMUCOSAL | Status: DC | PRN

## 2023-05-13 MED ORDER — GLYCOPYRROLATE PF 0.2 MG/ML IJ SOSY
PREFILLED_SYRINGE | INTRAMUSCULAR | Status: DC | PRN
  Administered 2023-05-13 (×2): .1 mg via INTRAVENOUS

## 2023-05-13 MED ORDER — METOCLOPRAMIDE HCL 5 MG PO TABS: 5.0000 mg | ORAL_TABLET | Freq: Three times a day (TID) | ORAL | Status: DC | PRN

## 2023-05-13 MED ORDER — PROPOFOL 10 MG/ML IV BOLUS
INTRAVENOUS | Status: DC | PRN
  Administered 2023-05-13: 50 ug/kg/min via INTRAVENOUS
  Administered 2023-05-13: 40 mg via INTRAVENOUS
  Administered 2023-05-13: 50 mg via INTRAVENOUS

## 2023-05-13 SURGICAL SUPPLY — 43 items
BAG COUNTER SPONGE SURGICOUNT (BAG) ×1 IMPLANT
BLADE SAW SGTL 73X25 THK (BLADE) ×1 IMPLANT
BRUSH SCRUB EZ PLAIN DRY (MISCELLANEOUS) ×2 IMPLANT
COVER SURGICAL LIGHT HANDLE (MISCELLANEOUS) ×1 IMPLANT
DRAPE INCISE IOBAN 85X60 (DRAPES) ×1 IMPLANT
DRAPE SURG ORHT 6 SPLT 77X108 (DRAPES) ×2 IMPLANT
DRAPE U-SHAPE 47X51 STRL (DRAPES) ×1 IMPLANT
DRSG AQUACEL AG ADV 3.5X10 (GAUZE/BANDAGES/DRESSINGS) IMPLANT
DRSG MEPILEX POST OP 4X8 (GAUZE/BANDAGES/DRESSINGS) ×1 IMPLANT
ELECT BLADE 6.5 EXT (BLADE) IMPLANT
ELECT CAUTERY BLADE 6.4 (BLADE) IMPLANT
ELECT REM PT RETURN 9FT ADLT (ELECTROSURGICAL) ×1
ELECTRODE REM PT RTRN 9FT ADLT (ELECTROSURGICAL) ×1 IMPLANT
GLOVE BIO SURGEON STRL SZ7.5 (GLOVE) ×1 IMPLANT
GLOVE BIO SURGEON STRL SZ8 (GLOVE) ×1 IMPLANT
GLOVE BIOGEL PI IND STRL 8 (GLOVE) ×2 IMPLANT
GLOVE SURG ORTHO LTX SZ7.5 (GLOVE) ×2 IMPLANT
GOWN STRL REUS W/ TWL LRG LVL3 (GOWN DISPOSABLE) ×1 IMPLANT
GOWN STRL REUS W/ TWL XL LVL3 (GOWN DISPOSABLE) ×1 IMPLANT
HEAD FEM UNIPOLAR 54 OD STRL (Hips) IMPLANT
KIT BASIN OR (CUSTOM PROCEDURE TRAY) ×1 IMPLANT
KIT TURNOVER KIT B (KITS) ×1 IMPLANT
MANIFOLD NEPTUNE II (INSTRUMENTS) ×1 IMPLANT
NDL 1/2 CIR MAYO (NEEDLE) IMPLANT
NEEDLE 1/2 CIR MAYO (NEEDLE)
NS IRRIG 1000ML POUR BTL (IV SOLUTION) ×1 IMPLANT
PACK TOTAL JOINT (CUSTOM PROCEDURE TRAY) ×1 IMPLANT
PAD ARMBOARD 7.5X6 YLW CONV (MISCELLANEOUS) ×2 IMPLANT
PILLOW ABDUCTION MEDIUM (MISCELLANEOUS) IMPLANT
RETRIEVER SUT HEWSON (MISCELLANEOUS) ×1 IMPLANT
SET HNDPC FAN SPRY TIP SCT (DISPOSABLE) IMPLANT
SPACER FEM TAPERED +0 12/14 (Hips) IMPLANT
STAPLER VISISTAT 35W (STAPLE) ×1 IMPLANT
STEM FEM SZ 7 42X155 STD (Stem) IMPLANT
SUT ETHILON 2 0 PSLX (SUTURE) ×2 IMPLANT
SUT FIBERWIRE #2 38 T-5 BLUE (SUTURE) ×2
SUT VIC AB 1 CT1 18XCR BRD 8 (SUTURE) ×1 IMPLANT
SUT VIC AB 1 CT1 27XBRD ANBCTR (SUTURE) ×2 IMPLANT
SUT VIC AB 2-0 CT1 TAPERPNT 27 (SUTURE) ×2 IMPLANT
SUTURE FIBERWR #2 38 T-5 BLUE (SUTURE) ×2 IMPLANT
TOWEL GREEN STERILE (TOWEL DISPOSABLE) ×1 IMPLANT
TOWEL GREEN STERILE FF (TOWEL DISPOSABLE) ×1 IMPLANT
WATER STERILE IRR 1000ML POUR (IV SOLUTION) ×1 IMPLANT

## 2023-05-13 NOTE — Transfer of Care (Signed)
 Immediate Anesthesia Transfer of Care Note  Patient: Kori Colin  Procedure(s) Performed: ARTHROPLASTY BIPOLAR HIP (HEMIARTHROPLASTY) (Left: Hip)  Patient Location: PACU  Anesthesia Type:General  Level of Consciousness: sedated  Airway & Oxygen Therapy: Patient Spontanous Breathing  Post-op Assessment: Report given to RN and Post -op Vital signs reviewed and stable  Post vital signs: Reviewed and stable  Last Vitals:  Vitals Value Taken Time  BP 111/69 05/13/23 1332  Temp    Pulse 66 05/13/23 1337  Resp 16 05/13/23 1337  SpO2 96 % 05/13/23 1337  Vitals shown include unfiled device data.  Last Pain:  Vitals:   05/13/23 0911  TempSrc: Oral  PainSc:          Complications: No notable events documented.

## 2023-05-13 NOTE — Anesthesia Preprocedure Evaluation (Signed)
 Anesthesia Evaluation  Patient identified by MRN, date of birth, ID band Patient confused    Reviewed: Allergy & Precautions, NPO status , Patient's Chart, lab work & pertinent test results  History of Anesthesia Complications Negative for: history of anesthetic complications  Airway Mallampati: III  TM Distance: >3 FB Neck ROM: Full    Dental  (+) Dental Advisory Given, Teeth Intact, Missing,    Pulmonary Current Smoker   breath sounds clear to auscultation       Cardiovascular  Rhythm:Regular     Neuro/Psych  PSYCHIATRIC DISORDERS     Dementia    GI/Hepatic   Endo/Other    Renal/GU      Musculoskeletal   Abdominal   Peds  Hematology Lab Results      Component                Value               Date                      WBC                      9.9                 05/13/2023                HGB                      12.4 (L)            05/13/2023                HCT                      37.0 (L)            05/13/2023                MCV                      101.1 (H)           05/13/2023                PLT                      167                 05/13/2023             No blood thinners   Anesthesia Other Findings   Reproductive/Obstetrics                              Anesthesia Physical Anesthesia Plan  ASA: 3  Anesthesia Plan: MAC and Spinal   Post-op Pain Management: Minimal or no pain anticipated   Induction: Intravenous  PONV Risk Score and Plan: 1 and Propofol  infusion  Airway Management Planned: Nasal Cannula, Natural Airway and Simple Face Mask  Additional Equipment: None  Intra-op Plan:   Post-operative Plan:   Informed Consent:    Patient has DNR.  Discussed DNR with power of attorney and Suspend DNR.   Consent reviewed with POA and History available from chart only  Plan Discussed with: CRNA  Anesthesia Plan Comments:          Anesthesia Quick  Evaluation

## 2023-05-13 NOTE — Progress Notes (Signed)
 Patient very disoriented and combative. He thinks he is at home and pulling at foley catheter and iv tubing. Patient has been placed in hand mittens for safety. Wife was at bedside, but is now gone for the day.

## 2023-05-13 NOTE — Plan of Care (Signed)
   Problem: Education: Goal: Knowledge of General Education information will improve Description: Including pain rating scale, medication(s)/side effects and non-pharmacologic comfort measures Outcome: Not Progressing   Problem: Health Behavior/Discharge Planning: Goal: Ability to manage health-related needs will improve Outcome: Not Progressing

## 2023-05-13 NOTE — Progress Notes (Signed)
 Pt is alert, oriented to self only, pleasant and cooperative. Spoke to pt's wife Vernona Rieger, consented for surgery by telephone witnessed by another nurse. She requests to speak to anesthesiologist. Will let anesthesia and surgeon know.

## 2023-05-13 NOTE — Anesthesia Procedure Notes (Signed)
 Procedure Name: LMA Insertion Date/Time: 05/13/2023 12:36 PM  Performed by: Afrah Burlison A, CRNAPre-anesthesia Checklist: Patient identified, Emergency Drugs available, Suction available and Patient being monitored Patient Re-evaluated:Patient Re-evaluated prior to induction Oxygen Delivery Method: Circle System Utilized Preoxygenation: Pre-oxygenation with 100% oxygen Induction Type: IV induction Ventilation: Mask ventilation without difficulty LMA: LMA inserted LMA Size: 4.0 Number of attempts: 1 Airway Equipment and Method: Bite block Placement Confirmation: positive ETCO2 Tube secured with: Tape Dental Injury: Teeth and Oropharynx as per pre-operative assessment

## 2023-05-13 NOTE — Plan of Care (Signed)
   Problem: Education: Goal: Knowledge of General Education information will improve Description Including pain rating scale, medication(s)/side effects and non-pharmacologic comfort measures Outcome: Progressing   Problem: Health Behavior/Discharge Planning: Goal: Ability to manage health-related needs will improve Outcome: Progressing

## 2023-05-13 NOTE — Op Note (Signed)
 05/13/2023  PATIENT:  Tanner Bond  October 19, 1955 male   MEDICAL RECORD NUMBER: [id number]  PRE-OPERATIVE DIAGNOSIS:  DISPLACED LEFT FEMORAL NECK FRACTURE  POST-OPERATIVE DIAGNOSIS:  DISPLACED LEFT FEMORAL NECK FRACTURE  PROCEDURE:  Procedure(s): UNIPOLAR HEMIARTHROPLASTY OF THE LEFT HIP with DePuy [place] Basic #7 femoral stem, standard neck, 54 mm head  SURGEON:  Surgeon(s) and Role:    DEWAINE Bond Sharper, MD - Primary  PHYSICIAN ASSISTANT: FRANCIS MT, PA-C  ANESTHESIA:   initially spinal then converted to general  EBL:  200 mL   BLOOD ADMINISTERED:none  DRAINS: none   LOCAL MEDICATIONS USED:  NONE  SPECIMEN:  No Specimen  DISPOSITION OF SPECIMEN:  N/A  COUNTS:  YES  TOURNIQUET:  * No tourniquets in log *  DICTATION: Note written in EPIC  PLAN OF CARE: Admit to inpatient   PATIENT DISPOSITION:  PACU - hemodynamically stable.   Delay start of Pharmacological VTE agent (>24hrs) due to surgical blood loss or risk of bleeding: no  BRIEF SUMMARY OF INDICATION FOR PROCEDURE:  Tanner Bond is a very pleasant 68 y.o. with dementia, who sustained an unwitnessed fall producing inability to bear weight, shortening, and external rotation of the extremity.  She was seen and evaluated with the recommendation for hemiarthroplasty. I discussed with the patient and family the risks and benefits, inclding the potential for leg length inequality, dislocation or instability, arthritis, loss of motion, DVT, PE, heart attack, stroke, and death.  Consent was given to proceed.  BRIEF SUMMARY OF PROCEDURE:  The patient was taken to the operating room where general anesthesia was induced and after administration of preoperative antibiotics consisting of 2 g of Ancef .  He was positioned with the left side up and all prominences were padded appropriately.  We made a 10 cm incision after the time-out, carrying dissection down to the IT band, was split in line with the skin.  Cerebellar  retractor was placed and we were able to then flex and internally rotate the hip releasing the piriformis and short rotators at their insertions.  The capsule was then T'd, tagging the corners with #1 Vicryl.  The neck cut was refined using a cutting guide and then this was followed by removal of the head, which sized perfectly to 54 mm. Mueller and Cobra retractors were placed along the proximal femur, which was then prepared with the canal finder,  then lateralizer, followed by reamers up to #8, and the broaches, achieving  outstanding fit and fill with the #7 broach. During reaming some muscle contraction was noted and so anesthesia proceed with induction of general anesthesia in addition to the spinal. The calcar reamer was used to refine the cut.  The canal was irrigated thoroughly and the acetabulum once again searched multiple times for fragments and irrigated thoroughly.  Trial components were placed and the patient had outstanding stability in combine 90 degrees of flexion, adduction, and internal rotation as well as in external rotation and extension.  Consequently, actual components were placed.  My assistant Francis Mt, was necessary for delivery and control of the proximal femur during preparation, also during relocation and dislocation of the trial components as well as relocation of the actual components.  He assisted me with wound closure as well.  I did repair the capsule with #1 Vicryl and then used #2 FiberWire through bone tunnels to repair the short rotators and piriformis.  This was followed by a #1 Vicryl for the IT band and lastly 2-0 Vicryl and nylon for the  subcutaneous and skin.  Sterile gently compressive dressing was applied.  The patient was awakened from anesthesia and transported to the PACU in stable condition.  PROGNOSIS:  The patient will be weightbearing as tolerated with posterior hip precautions.  Patient has an elevated risk of complications related to  declining overall health and mobility.  Tanner Bond remains on the Medical Service and will be on DVT prophylaxis mechanically and with Lovenox  while in the hospital and two weeks post discharge, but will not be a candidate for long-term prophylaxis given the dementia.     Tanner Bond, M.D.

## 2023-05-13 NOTE — Progress Notes (Addendum)
 [facility] Pre-op Review  Pre-op checklist: Partially completed    NPO: Ordered  Labs: T&S ordered, PCR + for staph and MRSA,   Consent: Ordered. Pt has dementia. Will need to get consent from legal guardian  H&P: Internal Med  Vitals: BP soft  O2 requirements: RA  MAR/PTA review: Mupirocin  started (1 dose), No BB, No anticoags, No GLP  IV: 22g  Floor nurse name:    Additional info:   Wife will be here to sign consent and to go down with pt to pre-op

## 2023-05-13 NOTE — TOC Initial Note (Addendum)
 Transition of Care [facility]) - Initial/Assessment Note    Patient Details  Name: Tanner Bond MRN: [id number] Date of Birth: March 20, 1956  [facility] of [facility]) CM/SW Contact:    Bridget Cordella Simmonds, LCSW Phone Number: 05/13/2023, 3:29 PM  Clinical Narrative:     CSW informed wife Leita requesting to speak, spoke with pt and wife in room.  Pt oriented x1 per epic, but friendly and talkative.  Pt here in June, DC STR to [facility].  Eventually ended up LTC at [facility], where he has fallen several times prior to this admission.  Wife would like new SNF and has been in touch with [facility] regarding STR to LTC care.  Per wife, Kristin[facility] indicated this is possible.  Pt had surgery today, still pending PT eval.  CSW agreed to pursue this once DC plan is clear.  CSW attempted to call Kristin[facility] but no answer.    1600: TC [facility].  They do have LTC bed available, please send FL2 and she will review.                Expected Discharge Plan: Skilled Nursing Facility Barriers to Discharge: Continued Medical Work up, Other (must enter comment) (pending PT eval)   Patient Goals and CMS Choice     Choice offered to / list presented to : Spouse (wife Leita)      Expected Discharge Plan and Services In-house Referral: Clinical Social Work     Living arrangements for the past 2 months: [name]                                      Prior Living Arrangements/Services Living arrangements for the past 2 months: Skilled Nursing Facility [name] Lives with:: Facility Resident Patient language and need for interpreter reviewed:: Yes        Need for Family Participation in Patient Care: Yes (Comment) Care giver support system in place?: Yes (comment) Current home services: Other (comment) (na) Criminal Activity/Legal Involvement Pertinent to Current Situation/Hospitalization: No - Comment as needed  Activities of Daily  Living   ADL Screening (condition at time of admission) Independently performs ADLs?: No Does the patient have a NEW difficulty with bathing/dressing/toileting/self-feeding that is expected to last >3 days?: Yes (Initiates electronic notice to provider for possible OT consult) Does the patient have a NEW difficulty with getting in/out of bed, walking, or climbing stairs that is expected to last >3 days?: Yes (Initiates electronic notice to provider for possible PT consult) Does the patient have a NEW difficulty with communication that is expected to last >3 days?: Yes (Initiates electronic notice to provider for possible SLP consult) Is the patient deaf or have difficulty hearing?: No Does the patient have difficulty seeing, even when wearing glasses/contacts?: No Does the patient have difficulty concentrating, remembering, or making decisions?: Yes  Permission Sought/Granted                  Emotional Assessment Appearance:: Appears older than stated age Attitude/Demeanor/Rapport: Engaged Affect (typically observed): Pleasant Orientation: : Oriented to Self      Admission diagnosis:  Closed displaced fracture of neck of left femur (HCC) [S72.002A] Patient Active Problem List   Diagnosis Date Noted   Closed displaced fracture of neck of left femur (HCC) 05/12/2023   Closed right hip fracture (HCC) 10/11/2022   Intraventricular hemorrhage (HCC) 10/11/2022   Dementia with behavioral disturbance (HCC)  10/11/2022   Hypokalemia 10/11/2022   Tobacco use disorder 10/11/2022   Acne 09/26/2017   BPH associated with nocturia 07/06/2017   Insomnia 07/06/2017   Alcoholic liver disease (HCC) 07/05/2017   Hypertension 04/13/2017   Alcoholism in remission (HCC) 04/12/2017   History of adenomatous polyp of colon 04/12/2017   PCP:  Henry Tanda FORBES Mickey., MD Pharmacy:   [facility] [phone] - [place], [place] - [address] [place] [address] Phone: [phone] Fax: [phone]  [id number] - [place], [place] - [address] [place] [address] Phone: [phone] Fax: [phone]     Social Drivers of Health (SDOH) Social History: SDOH Screenings   Food Insecurity: No Food Insecurity (05/12/2023)  Housing: Low Risk  (05/12/2023)  Transportation Needs: No Transportation Needs (05/12/2023)  Utilities: Not At Risk (05/12/2023)  Social Connections: Patient Unable To Answer (05/12/2023)  Tobacco Use: High Risk (05/13/2023)   SDOH Interventions:     Readmission Risk Interventions     No data to display
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
## 2023-05-13 NOTE — Anesthesia Procedure Notes (Signed)
 Spinal  Patient location during procedure: OR Start time: 05/13/2023 11:14 AM End time: 05/13/2023 11:18 AM Reason for block: surgical anesthesia Staffing Performed: anesthesiologist  Anesthesiologist: Keneth Lynwood POUR, MD Performed by: Keneth Lynwood POUR, MD Authorized by: Leopoldo Bruckner, MD   Preanesthetic Checklist Completed: patient identified, IV checked, site marked, risks and benefits discussed, surgical consent, monitors and equipment checked, pre-op evaluation and timeout performed Spinal Block Patient position: right lateral decubitus Prep: Betadine  Patient monitoring: heart rate, continuous pulse ox, blood pressure and cardiac monitor Approach: right paramedian Location: L4-5 Injection technique: single-shot Needle Needle type: Sprotte  Needle gauge: 24 G Needle length: 9 cm Assessment Events: CSF return Additional Notes

## 2023-05-14 DIAGNOSIS — S72002A Fracture of unspecified part of neck of left femur, initial encounter for closed fracture: Secondary | ICD-10-CM | POA: Diagnosis not present

## 2023-05-14 LAB — BASIC METABOLIC PANEL
Anion gap: 9 (ref 5–15)
BUN: 14 mg/dL (ref 8–23)
CO2: 26 mmol/L (ref 22–32)
Calcium: 8.6 mg/dL — ABNORMAL LOW (ref 8.9–10.3)
Chloride: 104 mmol/L (ref 98–111)
Creatinine, Ser: 0.75 mg/dL (ref 0.61–1.24)
GFR, Estimated: >60 mL/min (ref >60)
Glucose, Bld: 107 mg/dL — ABNORMAL HIGH (ref 70–99)
Potassium: 3.5 mmol/L (ref 3.5–5.1)
Sodium: 139 mmol/L (ref 135–145)

## 2023-05-14 LAB — CBC
HCT: 35.8 % — ABNORMAL LOW (ref 39.0–52.0)
Hemoglobin: 12.1 g/dL — ABNORMAL LOW (ref 13.0–17.0)
MCH: 34.2 pg — ABNORMAL HIGH (ref 26.0–34.0)
MCHC: 33.8 g/dL (ref 30.0–36.0)
MCV: 101.1 fL — ABNORMAL HIGH (ref 80.0–100.0)
Platelets: 165 10*3/uL (ref 150–400)
RBC: 3.54 MIL/uL — ABNORMAL LOW (ref 4.22–5.81)
RDW: 12.1 % (ref 11.5–15.5)
WBC: 9.6 10*3/uL (ref 4.0–10.5)
nRBC: 0 % (ref 0.0–0.2)

## 2023-05-14 MED ORDER — MORPHINE SULFATE (PF) 2 MG/ML IV SOLN: 1.0000 mg | INTRAVENOUS | Status: DC | PRN

## 2023-05-14 MED ORDER — ACETAMINOPHEN 500 MG PO TABS
1000.0000 mg | ORAL_TABLET | Freq: Three times a day (TID) | ORAL | Status: DC
Start: 2023-05-14 — End: 2023-05-17
  Administered 2023-05-14 – 2023-05-17 (×10): 1000 mg via ORAL
  Filled 2023-05-14 (×10): qty 2

## 2023-05-14 MED ORDER — SENNOSIDES-DOCUSATE SODIUM 8.6-50 MG PO TABS
1.0000 | ORAL_TABLET | Freq: Every day | ORAL | Status: DC
  Administered 2023-05-14 – 2023-05-16 (×3): 1 via ORAL
  Filled 2023-05-14 (×3): qty 1

## 2023-05-14 MED ORDER — OXYCODONE HCL 5 MG PO TABS
5.0000 mg | ORAL_TABLET | Freq: Four times a day (QID) | ORAL | Status: DC | PRN
  Administered 2023-05-16: 5 mg via ORAL
  Filled 2023-05-14: qty 1

## 2023-05-14 MED ORDER — MELATONIN 3 MG PO TABS
3.0000 mg | ORAL_TABLET | Freq: Every day | ORAL | Status: DC
  Administered 2023-05-14 – 2023-05-16 (×3): 3 mg via ORAL
  Filled 2023-05-14 (×3): qty 1

## 2023-05-14 NOTE — Progress Notes (Signed)
 Transition of Care [facility]) - CAGE-AID Screening   Patient Details  Name: Tanner Bond MRN: [id number] Date of Birth: 01/21/1956  Darice [name], RN Trauma Response Nurse Phone Number: [phone] 05/14/2023, 3:55 PM       CAGE-AID Screening: Substance Abuse Screening unable to be completed due to: : (S) Patient unable to participate (pt is confused, has a hx of ETOH use, living at a SNF currently)

## 2023-05-14 NOTE — Evaluation (Signed)
 Occupational Therapy Evaluation Patient Details Name: Tanner Bond MRN: [id number] DOB: 1955/06/11 Today's Date: 05/14/2023   History of Present Illness Patient is a 68 yo male that presents to the hospital for fall resulting in displaced transcervical femur fx s/p hemi arthroplasty.  PMHx: dementia, chronic ETOH abuse, BPH, fatty liver   Clinical Impression   Patient has been a resident of [facility] for the past 6 months and reports that he completing functional mobility with no AD and Independent in ADLs although patient does have dementia and is a questionable historian.  It is likely that patient required A for ADLs and has a known history of frequent falls.Patient current required max A x2 for supine<>sit and minA  x2 for safety and AD mgmt along with verbal/tactile cues for posterior hip precautions.  Patient would benefit from additional OT intervention to address functional deficits of bed mobility, falls, LB ADLs with AE, adhering to posterior hip precautions and increase independence in ADL transfers.. Patient would benefit from SNF placement to further increase functional independence in ADLs.      If plan is discharge home, recommend the following: Two people to help with walking and/or transfers;A lot of help with bathing/dressing/bathroom;Assistance with cooking/housework;Assistance with feeding;Assist for transportation;Help with stairs or ramp for entrance    Functional Status Assessment  Patient has had a recent decline in their functional status and demonstrates the ability to make significant improvements in function in a reasonable and predictable amount of time.  Equipment Recommendations  BSC/3in1;Toilet riser    Recommendations for Other Services       Precautions / Restrictions Precautions Precautions: Posterior Hip;Fall Restrictions Weight Bearing Restrictions Per Provider Order: No LLE Weight Bearing Per Provider Order: Weight bearing as tolerated  (WBAT/posterior hip precautions)      Mobility Bed Mobility Overal bed mobility: Needs Assistance Bed Mobility: Supine to Sit     Supine to sit: Max assist, +2 for physical assistance, +2 for safety/equipment, HOB elevated, Used rails          Transfers Overall transfer level: Needs assistance Equipment used: Rolling walker (2 wheels) Transfers: Sit to/from Stand Sit to Stand: Min assist, +2 physical assistance, +2 safety/equipment (verbal cues to place L LE in front of him before completing sit<>stand)                  Balance Overall balance assessment: Needs assistance, History of Falls Sitting-balance support: Feet supported, Bilateral upper extremity supported       Standing balance support: Bilateral upper extremity supported                               ADL either performed or assessed with clinical judgement   ADL Overall ADL's : Needs assistance/impaired Eating/Feeding: Set up;Minimal assistance (for opening containers and cutting meats) Eating/Feeding Details (indicate cue type and reason): Independent s/p setup Grooming: Wash/dry face;Wash/dry hands;Supervision/safety   Upper Body Bathing: Moderate assistance;Sitting   Lower Body Bathing: Total assistance   Upper Body Dressing : Moderate assistance;Sitting   Lower Body Dressing: Total assistance;+2 for physical assistance;+2 for safety/equipment;Cueing for safety;Cueing for sequencing   Toilet Transfer: +2 for physical assistance;+2 for safety/equipment;Minimal assistance;Rollator (4 wheels);BSC/3in1 Toilet Transfer Details (indicate cue type and reason):  (tactile cues for kicking L LE forward before transitioning from sit<>sit) Toileting- Clothing Manipulation and Hygiene: Total assistance       Functional mobility during ADLs: Minimal assistance;+2 for physical assistance;+2 for safety/equipment;Rolling  walker (2 wheels)       Vision Ability to See in Adequate Light: 0  Adequate Patient Visual Report: No change from baseline Vision Assessment?: No apparent visual deficits     Perception Perception: Not tested       Praxis Praxis: Not tested       Pertinent Vitals/Pain Pain Assessment Pain Assessment: 0-10 Pain Score: 0-No pain     Extremity/Trunk Assessment Upper Extremity Assessment Upper Extremity Assessment: Generalized weakness   Lower Extremity Assessment Lower Extremity Assessment: Defer to PT evaluation   Cervical / Trunk Assessment Cervical / Trunk Assessment: Normal   Communication Communication Communication: No apparent difficulties Cueing Techniques: Verbal cues;Tactile cues (requries verbal and tactile cues for sit to stand to place L LE forward to adhere to hip precautions along with  overall sequencing of RW and side stepping to HOB)   Cognition Arousal: Alert Behavior During Therapy: WFL for tasks assessed/performed Overall Cognitive Status: History of cognitive impairments - at baseline                                 General Comments:  (known dementia)     General Comments       Exercises     Shoulder Instructions      Home Living Family/patient expects to be discharged to:: Skilled nursing facility                                 Additional Comments: Patient is a resident at [facility] (memory care)      Prior Functioning/Environment Prior Level of Function : Needs assist  Cognitive Assist : Mobility (cognitive);ADLs (cognitive) Mobility (Cognitive): Step by step cues ADLs (Cognitive): Step by step cues Physical Assist : Mobility (physical);ADLs (physical) Mobility (physical): Bed mobility;Transfers;Gait ADLs (physical): Grooming;Bathing;Feeding;Dressing;Toileting;IADLs Mobility Comments: reports that he dooesnt use AD for mobility          OT Problem List: Decreased strength;Decreased activity tolerance;Decreased safety awareness;Decreased knowledge of use of DME or  AE;Decreased knowledge of precautions      OT Treatment/Interventions: Self-care/ADL training;Therapeutic exercise;Neuromuscular education;Energy conservation;DME and/or AE instruction;Therapeutic activities;Cognitive remediation/compensation;Patient/family education    OT Goals(Current goals can be found in the care plan section) Acute Rehab OT Goals OT Goal Formulation: With patient Time For Goal Achievement: 05/28/23 Potential to Achieve Goals: Good  OT Frequency: Min 1X/week    Co-evaluation PT/OT/SLP Co-Evaluation/Treatment: Yes Reason for Co-Treatment: For patient/therapist safety;To address functional/ADL transfers PT goals addressed during session: Mobility/safety with mobility OT goals addressed during session: ADL's and self-care      AM-PAC OT 6 Clicks Daily Activity     Outcome Measure Help from another person eating meals?: A Little Help from another person taking care of personal grooming?: A Little Help from another person toileting, which includes using toliet, bedpan, or urinal?: A Lot Help from another person bathing (including washing, rinsing, drying)?: A Lot Help from another person to put on and taking off regular upper body clothing?: A Lot Help from another person to put on and taking off regular lower body clothing?: Total 6 Click Score: 13   End of Session Equipment Utilized During Treatment: Gait belt;Rolling walker (2 wheels) Nurse Communication: Mobility status  Activity Tolerance: Patient tolerated treatment well Patient left: in bed;with call bell/phone within reach;with bed alarm set  OT Visit Diagnosis: Unsteadiness on feet (R26.81);Repeated falls (R29.6);History  of falling (Z91.81);Muscle weakness (generalized) (M62.81)                Time: 8856-8776 OT Time Calculation (min): 40 min Charges:  OT General Charges $OT Visit: 1 Visit OT Evaluation $OT Eval Moderate Complexity: 1 Mod OT Treatments $Self Care/Home Management : 23-37  mins Lamarr Pouch OT/L  Lamarr JONETTA Pouch 05/14/2023, 1:59 PM

## 2023-05-14 NOTE — Evaluation (Signed)
 Physical Therapy Evaluation Patient Details Name: Tanner Bond MRN: [id number] DOB: 08-26-1955 Today's Date: 05/14/2023  History of Present Illness  Patient is a 68 yo male that presents to the hospital for fall resulting in displaced transcervical femur fx s/p hemi arthroplasty.  PMHx: dementia, chronic ETOH abuse, BPH, fatty liver   Clinical Impression  Tanner Bond is 68 y.o. male admitted with above HPI and diagnosis. Patient is currently limited by functional impairments below (see PT problem list). Patient is a resident at SNF (memory care unit) and reports independent with no AD for mobility however unreliable historian and no family present to confirm baseline. Per chart pt with multiple falls at facility. Currently pt requires max cuing and assist to maintain posterior hip precautions and Max+2 for supine>sit and for return to supine at EOS. EOB elevated for sit<>stand and pt demonstrates good ability to initiate stand, min assist +2 to complete rise and stabilize balance. Pt able to take small side steps along EOB prior to return to sit, cues needed to extend Lt LE forward for post hip precautions with return to sitting. Patient will benefit from continued skilled PT interventions to address impairments and progress independence with mobility. Patient will benefit from continued inpatient follow up therapy, <3 hours/day. Acute PT will follow and progress as able.         If plan is discharge home, recommend the following: A lot of help with walking and/or transfers;A lot of help with bathing/dressing/bathroom;Assistance with cooking/housework;Assistance with feeding;Direct supervision/assist for medications management;Assist for transportation;Help with stairs or ramp for entrance;Supervision due to cognitive status   Can travel by private vehicle   No    Equipment Recommendations  (defer to next venue)  Recommendations for Other Services       Functional Status Assessment Patient  has had a recent decline in their functional status and demonstrates the ability to make significant improvements in function in a reasonable and predictable amount of time.     Precautions / Restrictions Precautions Precautions: Posterior Hip;Fall Restrictions Weight Bearing Restrictions Per Provider Order: No LLE Weight Bearing Per Provider Order: Weight bearing as tolerated Other Position/Activity Restrictions: posterior hip precautions      Mobility  Bed Mobility Overal bed mobility: Needs Assistance Bed Mobility: Supine to Sit     Supine to sit: Max assist, +2 for physical assistance, +2 for safety/equipment, HOB elevated, Used rails          Transfers Overall transfer level: Needs assistance Equipment used: Rolling walker (2 wheels) Transfers: Sit to/from Stand Sit to Stand: Min assist, +2 physical assistance, +2 safety/equipment (verbal cues to place L LE in front of him before completing sit<>stand)                Ambulation/Gait                  Stairs            Wheelchair Mobility     Tilt Bed    Modified Rankin (Stroke Patients Only)       Balance Overall balance assessment: Needs assistance, History of Falls Sitting-balance support: Feet supported, Bilateral upper extremity supported       Standing balance support: Bilateral upper extremity supported                                 Pertinent Vitals/Pain Pain Assessment Pain Assessment: PAINAD Breathing: normal Negative Vocalization: none Facial  Expression: smiling or inexpressive Body Language: relaxed Consolability: no need to console PAINAD Score: 0 Pain Intervention(s): Limited activity within patient's tolerance, Monitored during session, Repositioned    Home Living Family/patient expects to be discharged to:: Skilled nursing facility   Available Help at Discharge: Available 24 hours/day (SNF staff 24 hour care) Type of Home: Other(Comment) (skilled  nursing home)             Additional Comments: Patient is a resident at [facility] Main unit. reports does not use AD for mobility. no family present to confirm PLOF/baseline.    Prior Function Prior Level of Function : Needs assist  Cognitive Assist : Mobility (cognitive);ADLs (cognitive) Mobility (Cognitive): Step by step cues ADLs (Cognitive): Step by step cues Physical Assist : Mobility (physical);ADLs (physical) Mobility (physical): Bed mobility;Transfers;Gait ADLs (physical): Grooming;Bathing;Feeding;Dressing;Toileting;IADLs Mobility Comments: reports that he dooesnt use AD for mobility       Extremity/Trunk Assessment   Upper Extremity Assessment Upper Extremity Assessment: Defer to OT evaluation    Lower Extremity Assessment Lower Extremity Assessment: Generalized weakness    Cervical / Trunk Assessment Cervical / Trunk Assessment: Normal (frail)  Communication   Communication Communication: No apparent difficulties Cueing Techniques: Verbal cues;Tactile cues (requries verbal and tactile cues for sit to stand to place L LE forward to adhere to hip precautions along with  overall sequencing of RW and side stepping to HOB)  Cognition Arousal: Alert Behavior During Therapy: WFL for tasks assessed/performed Overall Cognitive Status: History of cognitive impairments - at baseline                                 General Comments:  (known dementia)        General Comments      Exercises     Assessment/Plan    PT Assessment Patient needs continued PT services  PT Problem List Decreased strength;Decreased range of motion;Decreased activity tolerance;Decreased balance;Decreased mobility;Decreased coordination;Decreased cognition;Decreased knowledge of use of DME;Decreased safety awareness;Decreased knowledge of precautions       PT Treatment Interventions DME instruction;Gait training;Stair training;Functional mobility training;Therapeutic  activities;Therapeutic exercise;Balance training;Neuromuscular re-education;Cognitive remediation;Patient/family education    PT Goals (Current goals can be found in the Care Plan section)  Acute Rehab PT Goals Patient Stated Goal: pt unable to state PT Goal Formulation: Patient unable to participate in goal setting Time For Goal Achievement: 05/28/23 Potential to Achieve Goals: Good    Frequency Min 1X/week     Co-evaluation PT/OT/SLP Co-Evaluation/Treatment: Yes Reason for Co-Treatment: For patient/therapist safety;To address functional/ADL transfers PT goals addressed during session: Mobility/safety with mobility OT goals addressed during session: ADL's and self-care       AM-PAC PT 6 Clicks Mobility  Outcome Measure Help needed turning from your back to your side while in a flat bed without using bedrails?: Total Help needed moving from lying on your back to sitting on the side of a flat bed without using bedrails?: Total Help needed moving to and from a bed to a chair (including a wheelchair)?: A Lot Help needed standing up from a chair using your arms (e.g., wheelchair or bedside chair)?: A Lot Help needed to walk in hospital room?: A Lot Help needed climbing 3-5 steps with a railing? : Total 6 Click Score: 9    End of Session Equipment Utilized During Treatment: Gait belt;Left knee immobilizer Activity Tolerance: Patient tolerated treatment well Patient left: in bed;with call bell/phone within reach;with  bed alarm set Nurse Communication: Mobility status PT Visit Diagnosis: Other abnormalities of gait and mobility (R26.89);Unsteadiness on feet (R26.81);Muscle weakness (generalized) (M62.81);History of falling (Z91.81);Difficulty in walking, not elsewhere classified (R26.2)    Time: 8855-8777 PT Time Calculation (min) (ACUTE ONLY): 38 min   Charges:   PT Evaluation $PT Eval Moderate Complexity: 1 Mod   PT General Charges $$ ACUTE PT VISIT: 1 Visit          Vernell DONEEN KLEIN, DPT Acute Rehabilitation Services Office [phone]  05/14/23 3:17 PM

## 2023-05-14 NOTE — Progress Notes (Signed)
 PROGRESS NOTE  Tanner Bond  DOB: [birth year]  PCP: Henry Tanda FORBES Mickey., MD FMW:[id number]  DOA: 05/12/2023  LOS: 2 days  Hospital Day: 3  Brief narrative: Tanner Bond is a 68 y.o. male with PMH significant for dementia, chronic alcoholism, fatty liver, BPH Has been living at [facility] nursing facility for last 6 months 1/2, patient was sent to the ED after an unwitnessed fall at the facility complaining of left leg pain Imaging in the ED showed left hip x-ray, mildly displaced transcervical femur neck fracture.   CT head and CT C-spine showed no acute abnormality.   Orthopedics consulted  1/3, underwent unipolar hemiarthroplasty of the left hip by Dr. Celena  Subjective: Patient was seen and examined this morning. Lying on bed.  Alert, awake.  Knows in the hospital.  No family at bedside. He has bilateral wrist restraints since yesterday afternoon.  Assessment and plan: Left femur neck fracture S/p unipolar left hip hemiarthroplasty -1/3 Dr. Celena Secondary to an unwitnessed fall Imaging and procedure as above Pain regimen with scheduled Tylenol , PRN Roxicodone  Dilaudid  Bowel regimen with scheduled Senokot as needed MiraLAX  DVT prophylaxis with Lovenox   Acute metabolic encephalopathy Underlying dementia Postop, patient has been confused, irritated, agitated. He required bilateral wrist restraints since yesterday. Also on as needed IV Ativan  Continued on PTA meds Seroquel , sertraline , BuSpar , donepezil , Namenda  I will add melatonin scheduled tonight.  Macrocytosis Hemoglobin seems stable over 12.  Repeat labs tomorrow Recent Labs    10/15/22 0804 05/12/23 1013 05/13/23 0701 05/13/23 1836 05/14/23 0443  HGB 12.7* 12.1* 12.4* 12.5* 12.1*  MCV 94.8 102.0* 101.1* 99.7 101.1*   Leukocytosis Likely reactive WBC count improved Recent Labs  Lab 05/12/23 1013 05/13/23 0701 05/13/23 1836 05/14/23 0443  WBC 11.0* 9.9 11.3* 9.6     Mobility: PT eval  pending  Goals of care   Code Status: Limited: Do not attempt resuscitation (DNR) -DNR-LIMITED -Do Not Intubate/DNI      DVT prophylaxis:  enoxaparin  (LOVENOX ) injection 40 mg Start: 05/14/23 0800 SCDs Start: 05/13/23 1435 SCDs Start: 05/12/23 1239   Antimicrobials: None Fluid: None Consultants: Orthopedics Family Communication: Wife not at bedside this morning  Status: Inpatient Level of care:  Med-Surg   Patient is from: [facility] long-term facility Needs to continue in-hospital care: POD 1, confused.  On bilateral wrist restraints Anticipated d/c to: Family is expecting a different nursing facility   Diet:  Diet Order             Diet regular Room service appropriate? Yes; Fluid consistency: Thin  Diet effective now                   Scheduled Meds:  acetaminophen   1,000 mg Oral TID   busPIRone   10 mg Oral TID   Chlorhexidine  Gluconate Cloth  6 each Topical Daily   docusate sodium   100 mg Oral BID   donepezil   10 mg Oral QHS   enoxaparin  (LOVENOX ) injection  40 mg Subcutaneous Q24H   memantine   5 mg Oral BID   mupirocin  ointment  1 Application Nasal BID   QUEtiapine   25 mg Oral QHS   sertraline   100 mg Oral Daily    PRN meds: LORazepam , menthol -cetylpyridinium **OR** phenol, metoCLOPramide  **OR** metoCLOPramide  (REGLAN ) injection, morphine  injection, ondansetron  **OR** ondansetron  (ZOFRAN ) IV, oxyCODONE , polyethylene glycol   Infusions:     Antimicrobials: Anti-infectives (From admission, onward)    Start     Dose/Rate Route Frequency Ordered Stop   05/13/23 1700  ceFAZolin  (ANCEF ) IVPB 2g/100 mL premix        2 g 200 mL/hr over 30 Minutes Intravenous Every 6 hours 05/13/23 1434 05/13/23 2341   05/13/23 0600  ceFAZolin  (ANCEF ) IVPB 2g/100 mL premix        2 g 200 mL/hr over 30 Minutes Intravenous On call to O.R. 05/12/23 1313 05/13/23 1125       Objective: Vitals:   05/14/23 0022 05/14/23 0800  BP: 105/66 108/66  Pulse: 75 78  Resp: 18  17  Temp:  98.2 F (36.8 C)  SpO2: 98% 100%    Intake/Output Summary (Last 24 hours) at 05/14/2023 1105 Last data filed at 05/14/2023 0000 Gross per 24 hour  Intake 1390 ml  Output 1900 ml  Net -510 ml   Filed Weights   05/13/23 0911  Weight: 75.6 kg   Weight change:  Body mass index is 21.4 kg/m.   Physical Exam: General exam: Pleasant, elderly Caucasian male.  Not in distress.  He is on bilateral wrist restraints Skin: No rashes, lesions or ulcers. HEENT: Atraumatic, normocephalic, no obvious bleeding Lungs: Clear to auscultation bilaterally,  CVS: S1, S2, no murmur,   GI/Abd: Soft, nontender, nondistended, bowel sound present,   CNS: Alert, awake, oriented to place only. Psychiatry: Sad affect Extremities: No pedal edema, no calf tenderness,   Data Review: I have personally reviewed the laboratory data and studies available.  F/u labs  Unresulted Labs (From admission, onward)     Start     Ordered   05/20/23 0500  Creatinine, serum  (enoxaparin  (LOVENOX )  CrCl >/= 30 mL/min  )  Weekly,   R     Comments: while on enoxaparin  therapy.    05/13/23 1434   05/15/23 0500  Basic metabolic panel  Tomorrow morning,   R        05/14/23 0844   05/15/23 0500  CBC with Differential/Platelet  Tomorrow morning,   R        05/14/23 0844   05/15/23 0500  VITAMIN D  25 Hydroxy (Vit-D Deficiency, Fractures)  Tomorrow morning,   R        05/14/23 0855   05/14/23 0500  CBC  Daily,   R     Comments: For 3 days.    05/13/23 1434   Signed and Held  Basic metabolic panel  Daily,   R     Comments: For 2 days .    Signed and Held           Total time spent in review of labs and imaging, patient evaluation, formulation of plan, documentation and communication with family: 45 minutes  Signed, Chapman Rota, MD [facility] 05/14/2023

## 2023-05-14 NOTE — Progress Notes (Signed)
 Orthopaedic Trauma Service Progress Note  Patient ID: Tanner Bond MRN: [id number] DOB/AGE: Sep 08, 1955 68 y.o.  Subjective:  Confused but calm Does not know he had surgery for L femoral neck fracture yesterday  Denies hip pain   Cbc looks good this am   ROS As above   Objective:   VITALS:   Vitals:   05/13/23 1401 05/13/23 1432 05/13/23 1956 05/14/23 0022  BP: 96/76 105/77 100/79 105/66  Pulse: 80 98 87 75  Resp: 15 16 18 18   Temp:  97.7 F (36.5 C)    TempSrc:  Oral    SpO2: 94% 90% 97% 98%  Weight:      Height:        Estimated body mass index is 21.4 kg/m as calculated from the following:   Height as of this encounter: 6' 2 (1.88 m).   Weight as of this encounter: 75.6 kg.   Intake/Output      01/03 0701 01/04 0700 01/04 0701 01/05 0700   P.O. 240    I.V. (mL/kg) 750 (9.9)    IV Piggyback 400    Total Intake(mL/kg) 1390 (18.4)    Urine (mL/kg/hr) 1600 (0.9)    Blood 300    Total Output 1900    Net -510           LABS  Results for orders placed or performed during the hospital encounter of 05/12/23 (from the past 24 hours)  CBC     Status: Abnormal   Collection Time: 05/13/23  6:36 PM  Result Value Ref Range   WBC 11.3 (H) 4.0 - 10.5 K/uL   RBC 3.68 (L) 4.22 - 5.81 MIL/uL   Hemoglobin 12.5 (L) 13.0 - 17.0 g/dL   HCT 63.2 (L) 60.9 - 47.9 %   MCV 99.7 80.0 - 100.0 fL   MCH 34.0 26.0 - 34.0 pg   MCHC 34.1 30.0 - 36.0 g/dL   RDW 87.5 88.4 - 84.4 %   Platelets 160 150 - 400 K/uL   nRBC 0.0 0.0 - 0.2 %  Creatinine, serum     Status: None   Collection Time: 05/13/23  6:36 PM  Result Value Ref Range   Creatinine, Ser 0.76 0.61 - 1.24 mg/dL   GFR, Estimated >39 >39 mL/min  CBC     Status: Abnormal   Collection Time: 05/14/23  4:43 AM  Result Value Ref Range   WBC 9.6 4.0 - 10.5 K/uL   RBC 3.54 (L) 4.22 - 5.81 MIL/uL   Hemoglobin 12.1 (L) 13.0 - 17.0 g/dL   HCT 64.1  (L) 60.9 - 52.0 %   MCV 101.1 (H) 80.0 - 100.0 fL   MCH 34.2 (H) 26.0 - 34.0 pg   MCHC 33.8 30.0 - 36.0 g/dL   RDW 87.8 88.4 - 84.4 %   Platelets 165 150 - 400 K/uL   nRBC 0.0 0.0 - 0.2 %     PHYSICAL EXAM:   Gen: sitting up in bed, soft restraints in place      Orientation: oriented to person Lungs: unlabored  Cardiac: reg Ext:       Left Lower extremity   Dressing L hip clean,dry and intact  Knee immobilizer in place  Ext warm   + DP pulse  No DCT  Motor and sensory functions grossly intact  Assessment/Plan: 1 Day Post-Op   Principal Problem:   Closed displaced fracture of neck of left femur (HCC) Active Problems:   Alcoholism in remission (HCC)   Hypertension   Alcoholic liver disease (HCC)   BPH associated with nocturia   Dementia with behavioral disturbance (HCC)   Anti-infectives (From admission, onward)    Start     Dose/Rate Route Frequency Ordered Stop   05/13/23 1700  ceFAZolin  (ANCEF ) IVPB 2g/100 mL premix        2 g 200 mL/hr over 30 Minutes Intravenous Every 6 hours 05/13/23 1434 05/13/23 2341   05/13/23 0600  ceFAZolin  (ANCEF ) IVPB 2g/100 mL premix        2 g 200 mL/hr over 30 Minutes Intravenous On call to O.R. 05/12/23 1313 05/13/23 1125     .  POD/HD#: 1  68 y/o male s/p fall with L femoral neck fracture, dementia, lives at memory care unit/snf   -fall  - Left femoral neck fracture s/p left hip hemiarthroplasty  Weightbearing WBAT L leg with walker   ROM/Activity   Posterior hip precautions L hip     Ok to DC knee immobilizer once he works with therapy    Wound care   Daily wound care starting on 05/16/2023   Therapy evals   - Pain management:  Minimize narcotics   - ABL anemia/Hemodynamics  Monitor  - Medical issues   Per primary   - DVT/PE prophylaxis:  Lovenox  while inpatient then for 14 days upon dc back to SNF - ID:   Periop abx  - Metabolic Bone Disease:  Fragility fracture left hip = osteoporosis     DEXA as outpt    Vitamin d  levels pending  - Activity:  As above  - FEN/GI prophylaxis/Foley/Lines:  Will need assistance with feeding   SLP eval   - Dispo:  Ortho issues stable  Follow up with ortho in 2 weeks for suture removal and follow up Tanner Francis MICAEL Deward, PA-C [phone] (C) 05/14/2023, 8:55 AM  [facility] [address] [place] [address] [phone] Tanner Bond (F)    After 5pm and on the weekends please log on to Amion, go to orthopaedics and the look under the [facility] Call for the provider(s) on call. You can also call our office at [phone] and then follow the prompts to be connected to the call team.  Patient ID: Tanner Bond, male   DOB: 10-Sep-1955, 68 y.o.   MRN: [id number]

## 2023-05-14 NOTE — Evaluation (Signed)
 Clinical/Bedside Swallow Evaluation Patient Details  Name: Tanner Bond MRN: [id number] Date of Birth: 09-Feb-1956  Today's Date: 05/14/2023 Time: SLP Start Time (ACUTE ONLY): 1427 SLP Stop Time (ACUTE ONLY): 1437 SLP Time Calculation (min) (ACUTE ONLY): 10 min  Past Medical History:  Past Medical History:  Diagnosis Date   Alcohol abuse    Dementia (HCC)    Liver dysfunction    Past Surgical History:  Past Surgical History:  Procedure Laterality Date   APPENDECTOMY     as child   INTRAMEDULLARY (IM) NAIL INTERTROCHANTERIC Right 10/12/2022   Procedure: INTRAMEDULLARY (IM) NAIL INTERTROCHANTERIC;  Surgeon: Yvone Rush, MD;  Location: [facility] OR;  Service: Orthopedics;  Laterality: Right;   URETHRAL DILATION     as child   HPI:  Patient is a 68 yo male that presents to the hospital for fall resulting in displaced transcervical femur fx s/p hemi arthroplasty.  PMHx: dementia, chronic ETOH abuse, BPH, fatty liver    Assessment / Plan / Recommendation  Clinical Impression  Pts swallow was grossly within functional limits. No overt s/sx of aspiration with any POs. No hx of dysphagia noted in the chart. Pt denies any acute difficulties. Continue regular thin liquid diet with standard aspiration precautions as pt aspiration risk mildly increased with hx of dementia.  SLP Visit Diagnosis: Dysphagia, unspecified (R13.10)    Aspiration Risk  Mild aspiration risk    Diet Recommendation   Thin;Age appropriate regular  Medication Administration: Whole meds with liquid    Other  Recommendations Oral Care Recommendations: Oral care BID    Recommendations for follow up therapy are one component of a multi-disciplinary discharge planning process, led by the attending physician.  Recommendations may be updated based on patient status, additional functional criteria and insurance authorization.  Follow up Recommendations Skilled nursing-short term rehab (<3 hours/day)      Assistance  Recommended at Discharge  Frequent or Constant supervision  Functional Status Assessment Patient has not had a recent decline in their functional status  Frequency and Duration   N/a         Prognosis   fair     Swallow Study   General Date of Onset: 05/12/23 HPI: Patient is a 68 yo male that presents to the hospital for fall resulting in displaced transcervical femur fx s/p hemi arthroplasty.  PMHx: dementia, chronic ETOH abuse, BPH, fatty liver Type of Study: Bedside Swallow Evaluation Previous Swallow Assessment: none on file Diet Prior to this Study: Regular;Thin liquids (Level 0) Temperature Spikes Noted: No Respiratory Status: Room air History of Recent Intubation: No Behavior/Cognition: Alert;Cooperative;Confused Oral Cavity Assessment: Within Functional Limits Oral Care Completed by SLP: No Oral Cavity - Dentition: Missing dentition;Poor condition Vision: Functional for self-feeding Self-Feeding Abilities: Able to feed self Patient Positioning: Upright in bed Baseline Vocal Quality: Low vocal intensity Volitional Swallow: Able to elicit    Oral/Motor/Sensory Function Overall Oral Motor/Sensory Function: Generalized oral weakness   Ice Chips Ice chips: Not tested   Thin Liquid Thin Liquid: Within functional limits Presentation: Cup;Straw    Nectar Thick Nectar Thick Liquid: Not tested   Honey Thick Honey Thick Liquid: Not tested   Puree Puree: Within functional limits   Solid     Solid: Within functional limits      Tanner Bond H. MA, CCC-SLP Acute Rehabilitation Services   05/14/2023,2:55 PM

## 2023-05-14 NOTE — Evaluation (Signed)
 Speech Language Pathology Evaluation Patient Details Name: Tanner Bond MRN: [id number] DOB: [birth year] Today's Date: 05/14/2023 Time: 8562-8544 SLP Time Calculation (min) (ACUTE ONLY): 18 min  Problem List:  Patient Active Problem List   Diagnosis Date Noted   Closed displaced fracture of neck of left femur (HCC) 05/12/2023   Closed right hip fracture (HCC) 10/11/2022   Intraventricular hemorrhage (HCC) 10/11/2022   Dementia with behavioral disturbance (HCC) 10/11/2022   Hypokalemia 10/11/2022   Tobacco use disorder 10/11/2022   Acne 09/26/2017   BPH associated with nocturia 07/06/2017   Insomnia 07/06/2017   Alcoholic liver disease (HCC) 07/05/2017   Hypertension 04/13/2017   Alcoholism in remission (HCC) 04/12/2017   History of adenomatous polyp of colon 04/12/2017   Past Medical History:  Past Medical History:  Diagnosis Date   Alcohol abuse    Dementia (HCC)    Liver dysfunction    Past Surgical History:  Past Surgical History:  Procedure Laterality Date   APPENDECTOMY     as child   INTRAMEDULLARY (IM) NAIL INTERTROCHANTERIC Right 10/12/2022   Procedure: INTRAMEDULLARY (IM) NAIL INTERTROCHANTERIC;  Surgeon: Yvone Rush, MD;  Location: [facility] OR;  Service: Orthopedics;  Laterality: Right;   URETHRAL DILATION     as child   HPI:  Patient is a 68 yo male that presents to the hospital for fall resulting in displaced transcervical femur fx s/p hemi arthroplasty.  PMHx: dementia, chronic ETOH abuse, BPH, fatty liver   Assessment / Plan / Recommendation Clinical Impression  Pt with hx of dementia, long term care resident at [facility] skilled nursing facility, per chart review. Pt with global cognitive deficits, ranging moderate to severe; oriented only to self this date. Receptive and expressive language appears intact as well as motor speech. Pt unable to recall functional new information (reason for hospital visit) even with repetition. No family members or caregivers  present this date to determine if current cognitive function is worse than baseline. Suspect cognitive function is worsened in setting of acute medical condition. Will follow up briefly for functional recall related to new injury, orientation to new environment to maximize safety and reduce adverse behaviors.    SLP Assessment  SLP Recommendation/Assessment: Patient needs continued Speech Lanaguage Pathology Services SLP Visit Diagnosis: Cognitive communication deficit (R41.841)    Recommendations for follow up therapy are one component of a multi-disciplinary discharge planning process, led by the attending physician.  Recommendations may be updated based on patient status, additional functional criteria and insurance authorization.    Follow Up Recommendations  Skilled nursing-short term rehab (<3 hours/day)    Assistance Recommended at Discharge  Frequent or constant Supervision/Assistance  Functional Status Assessment Patient has had a recent decline in their functional status and/or demonstrates limited ability to make significant improvements in function in a reasonable and predictable amount of time  Frequency and Duration min 1 x/week  1 week      SLP Evaluation Cognition  Overall Cognitive Status: History of cognitive impairments - at baseline Arousal/Alertness: Awake/alert Orientation Level: Oriented to person;Disoriented to place;Disoriented to time;Disoriented to situation Attention: Focused;Sustained Focused Attention: Impaired Focused Attention Impairment: Verbal basic;Verbal complex Sustained Attention: Impaired Sustained Attention Impairment: Verbal basic;Verbal complex Memory: Impaired Memory Impairment: Decreased short term memory;Decreased recall of new information Awareness: Impaired Problem Solving: Impaired Problem Solving Impairment: Verbal basic;Verbal complex Executive Function: Organizing;Sequencing Sequencing: Impaired Sequencing Impairment: Verbal  complex;Verbal basic Organizing: Impaired Organizing Impairment: Verbal basic;Verbal complex Behaviors: Confabulation Safety/Judgment: Impaired       Comprehension  Auditory  Comprehension Overall Auditory Comprehension: Appears within functional limits for tasks assessed    Expression Expression Primary Mode of Expression: Verbal Verbal Expression Overall Verbal Expression: Appears within functional limits for tasks assessed Written Expression Dominant Hand: Right   Oral / Motor  Oral Motor/Sensory Function Overall Oral Motor/Sensory Function: Generalized oral weakness Motor Speech Overall Motor Speech: Appears within functional limits for tasks assessed            Mitzie HUNT MA, CCC-SLP Acute Rehabilitation Services   05/14/2023, 3:13 PM

## 2023-05-15 DIAGNOSIS — S72002A Fracture of unspecified part of neck of left femur, initial encounter for closed fracture: Secondary | ICD-10-CM | POA: Diagnosis not present

## 2023-05-15 LAB — BASIC METABOLIC PANEL
Anion gap: 9 (ref 5–15)
BUN: 14 mg/dL (ref 8–23)
CO2: 26 mmol/L (ref 22–32)
Calcium: 8.6 mg/dL — ABNORMAL LOW (ref 8.9–10.3)
Chloride: 104 mmol/L (ref 98–111)
Creatinine, Ser: 0.63 mg/dL (ref 0.61–1.24)
GFR, Estimated: >60 mL/min (ref >60)
Glucose, Bld: 108 mg/dL — ABNORMAL HIGH (ref 70–99)
Potassium: 3.3 mmol/L — ABNORMAL LOW (ref 3.5–5.1)
Sodium: 139 mmol/L (ref 135–145)

## 2023-05-15 LAB — VITAMIN D 25 HYDROXY (VIT D DEFICIENCY, FRACTURES): Vit D, 25-Hydroxy: 57.36 ng/mL (ref 30–100)

## 2023-05-15 LAB — CBC WITH DIFFERENTIAL/PLATELET
Abs Immature Granulocytes: 0.04 10*3/uL (ref 0.00–0.07)
Basophils Absolute: 0 10*3/uL (ref 0.0–0.1)
Basophils Relative: 0 %
Eosinophils Absolute: 0.2 10*3/uL (ref 0.0–0.5)
Eosinophils Relative: 3 %
HCT: 33.6 % — ABNORMAL LOW (ref 39.0–52.0)
Hemoglobin: 11.7 g/dL — ABNORMAL LOW (ref 13.0–17.0)
Immature Granulocytes: 0 %
Lymphocytes Relative: 11 %
Lymphs Abs: 1 10*3/uL (ref 0.7–4.0)
MCH: 34.2 pg — ABNORMAL HIGH (ref 26.0–34.0)
MCHC: 34.8 g/dL (ref 30.0–36.0)
MCV: 98.2 fL (ref 80.0–100.0)
Monocytes Absolute: 0.9 10*3/uL (ref 0.1–1.0)
Monocytes Relative: 9 %
Neutro Abs: 6.9 10*3/uL (ref 1.7–7.7)
Neutrophils Relative %: 77 %
Platelets: 180 10*3/uL (ref 150–400)
RBC: 3.42 MIL/uL — ABNORMAL LOW (ref 4.22–5.81)
RDW: 12 % (ref 11.5–15.5)
WBC: 9 10*3/uL (ref 4.0–10.5)
nRBC: 0 % (ref 0.0–0.2)

## 2023-05-15 LAB — VITAMIN B12: Vitamin B-12: 498 pg/mL (ref 180–914)

## 2023-05-15 MED ORDER — ENSURE ENLIVE PO LIQD
237.0000 mL | Freq: Two times a day (BID) | ORAL | Status: DC
  Administered 2023-05-15 – 2023-05-17 (×5): 237 mL via ORAL

## 2023-05-15 MED ORDER — POTASSIUM CHLORIDE CRYS ER 20 MEQ PO TBCR
40.0000 meq | EXTENDED_RELEASE_TABLET | Freq: Once | ORAL | Status: AC
  Administered 2023-05-15: 40 meq via ORAL
  Filled 2023-05-15: qty 2

## 2023-05-15 MED ORDER — ADULT MULTIVITAMIN W/MINERALS CH
1.0000 | ORAL_TABLET | Freq: Every day | ORAL | Status: DC
  Administered 2023-05-15 – 2023-05-17 (×3): 1 via ORAL
  Filled 2023-05-15 (×3): qty 1

## 2023-05-15 NOTE — Progress Notes (Signed)
 Initial Nutrition Assessment  DOCUMENTATION CODES:   Not applicable  INTERVENTION:  Continue with current regular diet. Ensure Plus High Protein po BID, each supplement provides 350 kcal and 20 grams of protein. Multivitamin with minerals   NUTRITION DIAGNOSIS:   Increased nutrient needs related to hip fracture as evidenced by estimated needs.    GOAL:   Patient will meet greater than or equal to 90% of their needs    MONITOR:   PO intake, Supplement acceptance, Diet advancement  REASON FOR ASSESSMENT:   Consult Assessment of nutrition requirement/status  ASSESSMENT:  68 y.o.F, M presented from facility after a fall, admitting with closed displaced fracture or the left femur.  PMH: HTN, dementia, BPH, alcohol use fatty liver. Remote assessment all information obtained through EMR. Per chart review. Pt with global cognitive deficits, ranging moderate to severe; oriented only to self this date.  NT reports 100% of breakfast meal.  Independent feeding ability.  1/2- Patient sent to ED after unwitnessed fall at facility. Imaging in the ED showed left hip x-ray, mildly displaced transcervical femur neck fracture  1/3- underwent unipolar hemiarthroplasty of the left hip   Admit weight: 75.6 kg      Average Meal Intake;  Missing documentation.   Nutritionally Relevant Medications: Scheduled Meds:  donepezil   10 mg Oral QHS   memantine   5 mg Oral BID   QUEtiapine   25 mg Oral QHS   senna-docusate  1 tablet Oral QHS   sertraline   100 mg Oral Daily    PRN Meds:.LORazepam , menthol -cetylpyridinium **OR** phenol, metoCLOPramide  **OR** metoCLOPramide  (REGLAN ) injection, morphine  injection, ondansetron  **OR** ondansetron  (ZOFRAN ) IV, oxyCODONE , polyethylene glycol  Labs Reviewed:  CBG ranges from 107-108 mg/dL over the last 24 hours    NUTRITION - FOCUSED PHYSICAL EXAM:  Deferred   Diet Order:   Diet Order             Diet regular Room service appropriate? Yes;  Fluid consistency: Thin  Diet effective now                   EDUCATION NEEDS:   Education needs have been addressed  Skin:  Skin Assessment: Skin Integrity Issues: Skin Integrity Issues:: Incisions Incisions: Left hip  Last BM:  1/5 type 6  Height:   Ht Readings from Last 1 Encounters:  05/13/23 6' 2 (1.88 m)    Weight:   Wt Readings from Last 1 Encounters:  05/13/23 75.6 kg    Ideal Body Weight:     BMI:  Body mass index is 21.4 kg/m.  Estimated Nutritional Needs:   Kcal:  2275-2660 kcal  Protein:  100-115 g  Fluid:  1900-2300 ml    Jenna Pew RDN, LDN Clinical Dietitian   If unable to reach, please contact RD Inpatient secure chat group between 8 am-4 pm daily

## 2023-05-15 NOTE — NC FL2 (Signed)
 [place]  MEDICAID FL2 LEVEL OF CARE FORM     IDENTIFICATION  Patient Name: Tanner Bond Birthdate: [birth year] Sex: male Admission Date (Current Location): 05/12/2023  [facility] and [place] Number:  Producer, Television/film/video and Address:  [facility], [address] [place], [place] [address]      Provider Number: [id number]  Attending Physician Name and Address:  Arlice Reichert, MD  Relative Name and Phone Number:  [facility] Spouse [phone]    Current Level of Care: Hospital Recommended Level of Care: Skilled Nursing Facility Prior Approval Number:    Date Approved/Denied:   PASRR Number: [id number]  Discharge Plan: SNF    Current Diagnoses: Patient Active Problem List   Diagnosis Date Noted   Closed displaced fracture of neck of left femur (HCC) 05/12/2023   Closed right hip fracture (HCC) 10/11/2022   Intraventricular hemorrhage (HCC) 10/11/2022   Dementia with behavioral disturbance (HCC) 10/11/2022   Hypokalemia 10/11/2022   Tobacco use disorder 10/11/2022   Acne 09/26/2017   BPH associated with nocturia 07/06/2017   Insomnia 07/06/2017   Alcoholic liver disease (HCC) 07/05/2017   Hypertension 04/13/2017   Alcoholism in remission (HCC) 04/12/2017   History of adenomatous polyp of colon 04/12/2017    Orientation RESPIRATION BLADDER Height & Weight      (disoriented)  Normal Incontinent Weight: 166 lb 10.7 oz (75.6 kg) Height:  6' 2 (188 cm)  BEHAVIORAL SYMPTOMS/MOOD NEUROLOGICAL BOWEL NUTRITION STATUS      Incontinent Diet (see discharge summary)  AMBULATORY STATUS COMMUNICATION OF NEEDS Skin   Limited Assist Verbally Other (Comment) (left hip incision)                       Personal Care Assistance Level of Assistance  Bathing, Dressing Bathing Assistance: Limited assistance   Dressing Assistance: Limited assistance     Functional Limitations Info             SPECIAL CARE FACTORS FREQUENCY  PT (By licensed  PT), OT (By licensed OT)     PT Frequency: 5x/wk OT Frequency: 5x/wk            Contractures      Additional Factors Info  Code Status Code Status Info: DNR             Current Medications (05/15/2023):  This is the current hospital active medication list Current Facility-Administered Medications  Medication Dose Route Frequency Provider Last Rate Last Admin   acetaminophen  (TYLENOL ) tablet 1,000 mg  1,000 mg Oral TID Dahal, Binaya, MD   1,000 mg at 05/14/23 2151   busPIRone  (BUSPAR ) tablet 10 mg  10 mg Oral TID Deward Eck, PA-C   10 mg at 05/14/23 2150   Chlorhexidine  Gluconate Cloth 2 % PADS 6 each  6 each Topical Daily Deward Eck, PA-C       donepezil  (ARICEPT ) tablet 10 mg  10 mg Oral QHS Deward Eck, PA-C   10 mg at 05/14/23 2151   enoxaparin  (LOVENOX ) injection 40 mg  40 mg Subcutaneous Q24H Deward Eck, PA-C   40 mg at 05/15/23 9187   feeding supplement (ENSURE ENLIVE / ENSURE PLUS) liquid 237 mL  237 mL Oral BID BM Dahal, Reichert, MD       LORazepam  (ATIVAN ) injection 1 mg  1 mg Intravenous Q6H PRN Dahal, Binaya, MD   1 mg at 05/15/23 0329   melatonin tablet 3 mg  3 mg Oral QHS Arlice Reichert, MD  3 mg at 05/14/23 2151   memantine  (NAMENDA ) tablet 5 mg  5 mg Oral BID Deward Eck, PA-C   5 mg at 05/14/23 2151   menthol -cetylpyridinium (CEPACOL) lozenge 3 mg  1 lozenge Oral PRN Deward Eck, PA-C       Or   phenol (CHLORASEPTIC) mouth spray 1 spray  1 spray Mouth/Throat PRN Deward Eck, PA-C       metoCLOPramide  (REGLAN ) tablet 5-10 mg  5-10 mg Oral Q8H PRN Deward Eck, PA-C       Or   metoCLOPramide  (REGLAN ) injection 5-10 mg  5-10 mg Intravenous Q8H PRN Deward Eck, PA-C       morphine  (PF) 2 MG/ML injection 1 mg  1 mg Intravenous Q4H PRN Dahal, Binaya, MD       multivitamin with minerals tablet 1 tablet  1 tablet Oral Daily Dahal, Binaya, MD       mupirocin  ointment (BACTROBAN ) 2 % 1 Application  1 Application Nasal BID Deward Eck, PA-C   1 Application at 05/14/23  2152   ondansetron  (ZOFRAN ) tablet 4 mg  4 mg Oral Q6H PRN Deward Eck, PA-C       Or   ondansetron  (ZOFRAN ) injection 4 mg  4 mg Intravenous Q6H PRN Deward Eck, PA-C       oxyCODONE  (Oxy IR/ROXICODONE ) immediate release tablet 5 mg  5 mg Oral Q6H PRN Dahal, Binaya, MD       polyethylene glycol (MIRALAX  / GLYCOLAX ) packet 17 g  17 g Oral Daily PRN Deward Eck, PA-C       potassium chloride  SA (KLOR-CON  M) CR tablet 40 mEq  40 mEq Oral Once Dahal, Binaya, MD       QUEtiapine  (SEROQUEL ) tablet 25 mg  25 mg Oral QHS Deward Eck, PA-C   25 mg at 05/14/23 2151   senna-docusate (Senokot-S) tablet 1 tablet  1 tablet Oral QHS Dahal, Binaya, MD   1 tablet at 05/14/23 2151   sertraline  (ZOLOFT ) tablet 100 mg  100 mg Oral Daily Deward Eck, PA-C   100 mg at 05/14/23 9173     Discharge Medications: Please see discharge summary for a list of discharge medications.  Relevant Imaging Results:  Relevant Lab Results:   Additional Information SSN: [id number]  Dino HERO Johnjoseph Rolfe, LCSWA

## 2023-05-15 NOTE — Progress Notes (Signed)
 PROGRESS NOTE  Deward Sebek  DOB: Aug 11, 1955  PCP: Henry Tanda FORBES Mickey., MD FMW:[id number]  DOA: 05/12/2023  LOS: 3 days  Hospital Day: 4  Brief narrative: Jazper Nikolai is a 68 y.o. male with PMH significant for dementia, chronic alcoholism, fatty liver, BPH Has been living at [facility] nursing facility for last 6 months 1/2, patient was sent to the ED after an unwitnessed fall at the facility complaining of left leg pain Imaging in the ED showed left hip x-ray, mildly displaced transcervical femur neck fracture.   CT head and CT C-spine showed no acute abnormality.   Orthopedics consulted  1/3, underwent unipolar hemiarthroplasty of the left hip by Dr. Celena  Subjective: Patient was seen and examined this morning. Lying down on bed.  Seems confused. Has bilateral mittens.  No family at bedside. Labs from this morning with potassium low at 3.3.  Assessment and plan: Left femur neck fracture S/p unipolar left hip hemiarthroplasty -1/3 Dr. Celena Secondary to an unwitnessed fall Imaging and procedure as above Pain regimen with scheduled Tylenol , PRN Roxicodone  Dilaudid  Bowel regimen with scheduled Senokot as needed MiraLAX  DVT prophylaxis with Lovenox   Acute metabolic encephalopathy Underlying dementia Postop, patient has been confused, irritated, agitated. He has mittens on today. Currently continued on PTA meds Seroquel , sertraline , BuSpar , donepezil , Namenda  as well as melatonin nightly. Also on as needed IV Ativan   Hypokalemia Potassium was low at 3.3 this morning.  Replacement given Recent Labs  Lab 05/12/23 1013 05/13/23 0701 05/14/23 0932 05/15/23 0502  K 3.6 3.1* 3.5 3.3*   Macrocytosis Hemoglobin stable over 11  Recent Labs    05/12/23 1013 05/13/23 0701 05/13/23 1836 05/14/23 0443 05/15/23 0502  HGB 12.1* 12.4* 12.5* 12.1* 11.7*  MCV 102.0* 101.1* 99.7 101.1* 98.2  VITAMINB12  --   --   --   --  498   Leukocytosis Likely reactive WBC count  improved Recent Labs  Lab 05/12/23 1013 05/13/23 0701 05/13/23 1836 05/14/23 0443 05/15/23 0502  WBC 11.0* 9.9 11.3* 9.6 9.0     Mobility: PT eval obtained.  Goals of care   Code Status: Limited: Do not attempt resuscitation (DNR) -DNR-LIMITED -Do Not Intubate/DNI      DVT prophylaxis:  enoxaparin  (LOVENOX ) injection 40 mg Start: 05/14/23 0800 SCDs Start: 05/13/23 1435 SCDs Start: 05/12/23 1239   Antimicrobials: None Fluid: None Consultants: Orthopedics Family Communication: I called and updated his wife on the phone this morning.  Status: Inpatient Level of care:  Med-Surg   Patient is from: [facility] long-term facility Needs to continue in-hospital care: confused.  On bilateral mittens Anticipated d/c to: Family is expecting a different nursing facility   Diet:  Diet Order             Diet regular Room service appropriate? Yes; Fluid consistency: Thin  Diet effective now                   Scheduled Meds:  acetaminophen   1,000 mg Oral TID   busPIRone   10 mg Oral TID   Chlorhexidine  Gluconate Cloth  6 each Topical Daily   donepezil   10 mg Oral QHS   enoxaparin  (LOVENOX ) injection  40 mg Subcutaneous Q24H   feeding supplement  237 mL Oral BID BM   melatonin  3 mg Oral QHS   memantine   5 mg Oral BID   multivitamin with minerals  1 tablet Oral Daily   mupirocin  ointment  1 Application Nasal BID   potassium chloride   40  mEq Oral Once   QUEtiapine   25 mg Oral QHS   senna-docusate  1 tablet Oral QHS   sertraline   100 mg Oral Daily    PRN meds: LORazepam , menthol -cetylpyridinium **OR** phenol, metoCLOPramide  **OR** metoCLOPramide  (REGLAN ) injection, morphine  injection, ondansetron  **OR** ondansetron  (ZOFRAN ) IV, oxyCODONE , polyethylene glycol   Infusions:     Antimicrobials: Anti-infectives (From admission, onward)    Start     Dose/Rate Route Frequency Ordered Stop   05/13/23 1700  ceFAZolin  (ANCEF ) IVPB 2g/100 mL premix        2 g 200 mL/hr  over 30 Minutes Intravenous Every 6 hours 05/13/23 1434 05/13/23 2341   05/13/23 0600  ceFAZolin  (ANCEF ) IVPB 2g/100 mL premix        2 g 200 mL/hr over 30 Minutes Intravenous On call to O.R. 05/12/23 1313 05/13/23 1125       Objective: Vitals:   05/15/23 0408 05/15/23 1003  BP: 106/73 121/74  Pulse: 73 77  Resp: 16   Temp: 97.8 F (36.6 C) 98.1 F (36.7 C)  SpO2: 96% 98%    Intake/Output Summary (Last 24 hours) at 05/15/2023 1111 Last data filed at 05/15/2023 0300 Gross per 24 hour  Intake --  Output 250 ml  Net -250 ml   Filed Weights   05/13/23 0911  Weight: 75.6 kg   Weight change:  Body mass index is 21.4 kg/m.   Physical Exam: General exam: Pleasant, elderly Caucasian male.  Not in distress.  Confused  skin: No rashes, lesions or ulcers. HEENT: Atraumatic, normocephalic, no obvious bleeding Lungs: Clear to auscultation bilaterally,  CVS: S1, S2, no murmur,   GI/Abd: Soft, nontender, nondistended, bowel sound present,   CNS: Alert, opens eyes on command.otherwise not responding to questions or commands today. Psychiatry: Sad affect Extremities: No pedal edema, no calf tenderness,   Data Review: I have personally reviewed the laboratory data and studies available.  F/u labs  Unresulted Labs (From admission, onward)     Start     Ordered   05/20/23 0500  Creatinine, serum  (enoxaparin  (LOVENOX )  CrCl >/= 30 mL/min  )  Weekly,   R     Comments: while on enoxaparin  therapy.    05/13/23 1434   05/14/23 0500  CBC  Daily,   R     Comments: For 3 days.    05/13/23 1434           Total time spent in review of labs and imaging, patient evaluation, formulation of plan, documentation and communication with family: 45 minutes  Signed, Chapman Rota, MD [facility] 05/15/2023

## 2023-05-16 ENCOUNTER — Encounter (HOSPITAL_COMMUNITY): Payer: Self-pay | Admitting: Orthopedic Surgery

## 2023-05-16 DIAGNOSIS — S72002A Fracture of unspecified part of neck of left femur, initial encounter for closed fracture: Secondary | ICD-10-CM | POA: Diagnosis not present

## 2023-05-16 MED ORDER — POLYETHYLENE GLYCOL 3350 17 G PO PACK
17.0000 g | PACK | Freq: Two times a day (BID) | ORAL | Status: DC
  Administered 2023-05-16 – 2023-05-17 (×3): 17 g via ORAL
  Filled 2023-05-16 (×3): qty 1

## 2023-05-16 MED ORDER — ACETAMINOPHEN 500 MG PO TABS: 1000.0000 mg | ORAL_TABLET | Freq: Three times a day (TID) | ORAL | Status: DC | PRN

## 2023-05-16 MED ORDER — POTASSIUM CHLORIDE CRYS ER 20 MEQ PO TBCR
40.0000 meq | EXTENDED_RELEASE_TABLET | Freq: Two times a day (BID) | ORAL | Status: AC
  Administered 2023-05-16 (×2): 40 meq via ORAL
  Filled 2023-05-16 (×2): qty 2

## 2023-05-16 MED ORDER — HALOPERIDOL LACTATE 5 MG/ML IJ SOLN
1.0000 mg | Freq: Four times a day (QID) | INTRAMUSCULAR | Status: DC | PRN
  Administered 2023-05-16 – 2023-05-17 (×2): 1 mg via INTRAVENOUS
  Filled 2023-05-16 (×2): qty 1

## 2023-05-16 MED ORDER — ENOXAPARIN SODIUM 40 MG/0.4ML IJ SOSY: 40.0000 mg | PREFILLED_SYRINGE | INTRAMUSCULAR | Status: DC

## 2023-05-16 NOTE — Discharge Instructions (Signed)
 Orthopaedic Trauma Service Discharge Instructions   General Discharge Instructions  Orthopaedic Injuries:  Left femoral neck fracture treated with left hip hemiarthroplasty  WEIGHT BEARING STATUS: Weight-bear as tolerated left hip with walker  RANGE OF MOTION/ACTIVITY: Posterior hip precautions.  Activity as tolerated otherwise  Bone health: Fracture is indicative of osteoporosis.  Recommend bone density scan in the next 4 to 8 weeks  Review the following resource for additional information regarding bone health  [email or web address]  Wound Care: Daily wound care starting upon discharge.  Okay to leave incision open to air once there is no drainage.  Clean with soap and water only.  Okay to continue to cover if there is drainage or if patient is picking at sutures   Discharge Wound Care Instructions  Do NOT apply any ointments, solutions or lotions to pin sites or surgical wounds.  These prevent needed drainage and even though solutions like hydrogen peroxide kill bacteria, they also damage cells lining the pin sites that help fight infection.  Applying lotions or ointments can keep the wounds moist and can cause them to breakdown and open up as well. This can increase the risk for infection. When in doubt call the office.  Surgical incisions should be dressed daily.  If any drainage is noted, use one layer of adaptic or Mepitel, then gauze and tape.  Alternatively you can use a silicone foam dressing such as a Mepilex.  [email or web address].cz [email or web address]://dennis-soto.com/?pd_rd_[email or web address]&th=1  [email or web address].com/  These dressing supplies should be available at local medical supply stores ([employer], [employer], etc). They are not usually carried at places like [facility],  [facility], [facility], etc  Once the incision is completely dry and without drainage, it may be left open to air out.  Showering may begin 36-48 hours later.  Cleaning gently with soap and water.   DVT/PE prophylaxis: Lovenox  40 mg subcutaneous injection daily for 14 days for blood clot prevention  Diet: as you were eating previously.  Can use over the counter stool softeners and bowel preparations, such as Miralax , to help with bowel movements.  Narcotics can be constipating.  Be sure to drink plenty of fluids  PAIN MEDICATION USE AND EXPECTATIONS  You have likely been given narcotic medications to help control your pain.  After a traumatic event that results in an fracture (broken bone) with or without surgery, it is ok to use narcotic pain medications to help control one's pain.  We understand that everyone responds to pain differently and each individual patient will be evaluated on a regular basis for the continued need for narcotic medications. Ideally, narcotic medication use should last no more than 6-8 weeks (coinciding with fracture healing).   As a patient it is your responsibility as well to monitor narcotic medication use and report the amount and frequency you use these medications when you come to your office visit.   We would also advise that if you are using narcotic medications, you should take a dose prior to therapy to maximize you participation.  IF YOU ARE ON NARCOTIC MEDICATIONS IT IS NOT PERMISSIBLE TO OPERATE A MOTOR VEHICLE (MOTORCYCLE/CAR/TRUCK/MOPED) OR HEAVY MACHINERY DO NOT MIX NARCOTICS WITH OTHER CNS (CENTRAL NERVOUS SYSTEM) DEPRESSANTS SUCH AS ALCOHOL   POST-OPERATIVE OPIOID TAPER INSTRUCTIONS: It is important to wean off of your opioid medication as soon as possible. If you do not need pain medication after your surgery it is ok to stop day one. Opioids include: Codeine, Hydrocodone (Norco, Vicodin), Oxycodone (Percocet, oxycontin ) and hydromorphone  amongst others.   Long  term and even short term use of opiods can cause: Increased pain response Dependence Constipation Depression Respiratory depression And more.  Withdrawal symptoms can include Flu like symptoms Nausea, vomiting And more Techniques to manage these symptoms Hydrate well Eat regular healthy meals Stay active Use relaxation techniques(deep breathing, meditating, yoga) Do Not substitute Alcohol to help with tapering If you have been on opioids for less than two weeks and do not have pain than it is ok to stop all together.  Plan to wean off of opioids This plan should start within one week post op of your fracture surgery  Maintain the same interval or time between taking each dose and first decrease the dose.  Cut the total daily intake of opioids by one tablet each day Next start to increase the time between doses. The last dose that should be eliminated is the evening dose.    STOP SMOKING OR USING NICOTINE PRODUCTS!!!!  As discussed nicotine severely impairs your body's ability to heal surgical and traumatic wounds but also impairs bone healing.  Wounds and bone heal by forming microscopic blood vessels (angiogenesis) and nicotine is a vasoconstrictor (essentially, shrinks blood vessels).  Therefore, if vasoconstriction occurs to these microscopic blood vessels they essentially disappear and are unable to deliver necessary nutrients to the healing tissue.  This is one modifiable factor that you can do to dramatically increase your chances of healing your injury.    (This means no smoking, no nicotine gum, patches, etc)  DO NOT USE NONSTEROIDAL ANTI-INFLAMMATORY DRUGS (NSAID'S)  Using products such as Advil (ibuprofen), Aleve (naproxen), Motrin (ibuprofen) for additional pain control during fracture healing can delay and/or prevent the healing response.  If you would like to take over the counter (OTC) medication, Tylenol  (acetaminophen ) is ok.  However, some narcotic medications  that are given for pain control contain acetaminophen  as well. Therefore, you should not exceed more than 4000 mg of tylenol  in a day if you do not have liver disease.  Also note that there are may OTC medicines, such as cold medicines and allergy medicines that my contain tylenol  as well.  If you have any questions about medications and/or interactions please ask your doctor/PA or your pharmacist.      ICE AND ELEVATE INJURED/OPERATIVE EXTREMITY  Using ice and elevating the injured extremity above your heart can help with swelling and pain control.  Icing in a pulsatile fashion, such as 20 minutes on and 20 minutes off, can be followed.    Do not place ice directly on skin. Make sure there is a barrier between to skin and the ice pack.    Using frozen items such as frozen peas works well as the conform nicely to the are that needs to be iced.  USE AN ACE WRAP OR TED HOSE FOR SWELLING CONTROL  In addition to icing and elevation, Ace wraps or TED hose are used to help limit and resolve swelling.  It is recommended to use Ace wraps or TED hose until you are informed to stop.    When using Ace Wraps start the wrapping distally (farthest away from the body) and wrap proximally (closer to the body)   Example: If you had surgery on your leg or thing and you do not have a splint on, start the ace wrap at the toes and work your way up to the thigh        If you had surgery on your upper extremity and do not have a splint  on, start the ace wrap at your fingers and work your way up to the upper arm  IF YOU ARE IN A SPLINT OR CAST DO NOT REMOVE IT FOR ANY REASON   If your splint gets wet for any reason please contact the office immediately. You may shower in your splint or cast as long as you keep it dry.  This can be done by wrapping in a cast cover or garbage back (or similar)  Do Not stick any thing down your splint or cast such as pencils, money, or hangers to try and scratch yourself with.  If you feel  itchy take benadryl as prescribed on the bottle for itching  IF YOU ARE IN A CAM BOOT (BLACK BOOT)  You may remove boot periodically. Perform daily dressing changes as noted below.  Wash the liner of the boot regularly and wear a sock when wearing the boot. It is recommended that you sleep in the boot until told otherwise    Call office for the following: Temperature greater than 101F Persistent nausea and vomiting Severe uncontrolled pain Redness, tenderness, or signs of infection (pain, swelling, redness, odor or green/yellow discharge around the site) Difficulty breathing, headache or visual disturbances Hives Persistent dizziness or light-headedness Extreme fatigue Any other questions or concerns you may have after discharge  In an emergency, call 911 or go to an Emergency Department at a nearby hospital  HELPFUL INFORMATION  If you had a block, it will wear off between 8-24 hrs postop typically.  This is period when your pain may go from nearly zero to the pain you would have had postop without the block.  This is an abrupt transition but nothing dangerous is happening.  You may take an extra dose of narcotic when this happens.  You should wean off your narcotic medicines as soon as you are able.  Most patients will be off or using minimal narcotics before their first postop appointment.   We suggest you use the pain medication the first night prior to going to bed, in order to ease any pain when the anesthesia wears off. You should avoid taking pain medications on an empty stomach as it will make you nauseous.  Do not drink alcoholic beverages or take illicit drugs when taking pain medications.  In most states it is against the law to drive while you are in a splint or sling.  And certainly against the law to drive while taking narcotics.  You may return to work/school in the next couple of days when you feel up to it.   Pain medication may make you constipated.  Below are a  few solutions to try in this order: Decrease the amount of pain medication if you aren't having pain. Drink lots of decaffeinated fluids. Drink prune juice and/or each dried prunes  If the first 3 don't work start with additional solutions Take Colace - an over-the-counter stool softener Take Senokot - an over-the-counter laxative Take Miralax  - a stronger over-the-counter laxative     CALL THE OFFICE WITH ANY QUESTIONS OR CONCERNS: [phone]   VISIT OUR WEBSITE FOR ADDITIONAL INFORMATION: [email or web address]

## 2023-05-16 NOTE — Progress Notes (Signed)
 Orthopaedic Trauma Service Progress Note  Patient ID: Tanner Bond MRN: [id number] DOB/AGE: 01-17-1956 68 y.o.  Subjective:  Ortho issues stable    ROS As above  Objective:   VITALS:   Vitals:   05/15/23 1547 05/15/23 2011 05/16/23 0510 05/16/23 0813  BP: 115/72 117/79 107/71 109/71  Pulse: 85 78 (!) 56 68  Resp: 16 17 16 17   Temp: 98 F (36.7 C) 97.9 F (36.6 C) (!) 97.5 F (36.4 C) 97.7 F (36.5 C)  TempSrc:    Oral  SpO2: 99% 97% 97% 100%  Weight:      Height:        Estimated body mass index is 21.4 kg/m as calculated from the following:   Height as of this encounter: 6' 2 (1.88 m).   Weight as of this encounter: 75.6 kg.   Intake/Output      01/05 0701 01/06 0700 01/06 0701 01/07 0700   P.O. 860    Total Intake(mL/kg) 860 (11.4)    Urine (mL/kg/hr)     Stool     Total Output     Net +860         Urine Occurrence 250 x      LABS  No results found for this or any previous visit (from the past 24 hours).   PHYSICAL EXAM:   Gen: in bed, sleeping  Cardiac: reg Ext:       Left Lower extremity              Dressing L hip clean,dry and intact             Knee immobilizer in place             Ext warm              + DP pulse             No DCT             Motor and sensory functions grossly intact  Assessment/Plan: 3 Days Post-Op   Principal Problem:   Closed displaced fracture of neck of left femur (HCC) Active Problems:   Alcoholism in remission (HCC)   Hypertension   Alcoholic liver disease (HCC)   BPH associated with nocturia   Dementia with behavioral disturbance (HCC)   Anti-infectives (From admission, onward)    Start     Dose/Rate Route Frequency Ordered Stop   05/13/23 1700  ceFAZolin  (ANCEF ) IVPB 2g/100 mL premix        2 g 200 mL/hr over 30 Minutes Intravenous Every 6 hours 05/13/23 1434 05/13/23 2341   05/13/23 0600  ceFAZolin  (ANCEF ) IVPB  2g/100 mL premix        2 g 200 mL/hr over 30 Minutes Intravenous On call to O.R. 05/12/23 1313 05/13/23 1125     .  POD/HD#: 29  68 y/o male s/p fall with L femoral neck fracture, dementia, lives at memory care unit/snf     -fall   - Left femoral neck fracture s/p left hip hemiarthroplasty  Weightbearing WBAT L leg with walker               ROM/Activity  Posterior hip precautions L hip                                      Ok to DC knee immobilizer once he works with therapy                Wound care                         Daily wound care as needed                Therapies      - Pain management:             Minimize narcotics              - ABL anemia/Hemodynamics             Monitor   - Medical issues              Per primary    - DVT/PE prophylaxis:             Lovenox  while inpatient then for 14 days upon dc back to SNF - ID:              Periop abx   - Metabolic Bone Disease:             Fragility fracture left hip = osteoporosis                          DEXA as outpt                          Vitamin d  levels pending  - Activity:             As above   - FEN/GI prophylaxis/Foley/Lines:             Will need assistance with feeding              SLP eval    - Dispo:             Ortho issues stable             Follow up with ortho in 2 weeks for suture removal and follow up landy Francis MICAEL Deward, PA-C [phone] (C) 05/16/2023, 10:44 AM  [facility] [address] [place] [address] [phone] (F)    After 5pm and on the weekends please log on to Amion, go to orthopaedics and the look under the [facility] Call for the provider(s) on call. You can also call our office at [phone] and then follow the prompts to be connected to the call team.  Patient ID: Tanner Bond, male   DOB: 17-Mar-1956, 68 y.o.   MRN: [id number]

## 2023-05-16 NOTE — TOC Progression Note (Signed)
 Transition of Care [facility]) - Progression Note    Patient Details  Name: Tanner Bond MRN: [id number] Date of Birth: 13-Mar-1956  [facility] CM/SW Contact  Bridget Cordella Simmonds, LCSW Phone Number: 05/16/2023, 10:24 AM  Clinical Narrative:   Message from [facility]: they cannot offer bed.      Expected Discharge Plan: Skilled Nursing Facility Barriers to Discharge: Continued Medical Work up, Other (must enter comment) (pending PT eval)  Expected Discharge Plan and Services In-house Referral: Clinical Social Work     Living arrangements for the past 2 months: Skilled Nursing Facility [name]                                       Social Determinants of Health (SDOH) Interventions SDOH Screenings   Food Insecurity: No Food Insecurity (05/12/2023)  Housing: Low Risk  (05/12/2023)  Transportation Needs: No Transportation Needs (05/12/2023)  Utilities: Not At Risk (05/12/2023)  Social Connections: Patient Unable To Answer (05/12/2023)  Tobacco Use: High Risk (05/13/2023)    Readmission Risk Interventions     No data to display

## 2023-05-16 NOTE — Progress Notes (Addendum)
 Tanner Bond PROGRESS NOTE    Progress Note  Tanner Bond  FMW:[id number] DOB: 04-19-1956 DOA: 05/12/2023 PCP: Henry Tanda FORBES Mickey., MD     Brief Narrative:   Tanner Bond is an 68 y.o. male past medical history significant for dementia, chronic alcoholism fatty liver BPH has been residing in [facility] for the last 6 months sent to the ED on 05/11/2022 for an unwitnessed fall started complaining of left leg pain, imaging showed mildly displaced neck femoral fracture, CT scan of the head and C-spine showed no acute abnormalities. Orthopedic surgery was consulted    Assessment/Plan:   Closed displaced fracture of neck of left femur (HCC) Status post left hip arthroplasty on 05/13/2023. Analgesics and anticoagulation per orthopedic surgery. PT evaluated the patient recommended skilled nursing facility placement.  Acute metabolic encephalopathy/underlying dementia: Prior to admission he was on Seroquel , sertraline  BuSpar  donezepil Namenda . Placed on melatonin here at night. Discontinue Ativan  Haldol  as needed.  Hypokalemia: Potassium still low replete orally recheck in the morning.  Macrocytic anemia: Follow-up PCP as an outpatient.  Leukocytosis: Likely reactive now improved.   DVT prophylaxis: lovenox  Family Communication:none Status is: Inpatient Remains inpatient appropriate because: Still confused came in with an acute hip fracture    Code Status:     Code Status Orders  (From admission, onward)           Start     Ordered   05/12/23 1300  Do not attempt resuscitation (DNR)- Limited -Do Not Intubate (DNI)  (Code Status)  Continuous       Question Answer Comment  If pulseless and not breathing No CPR or chest compressions.   In Pre-Arrest Conditions (Patient Is Breathing and Has A Pulse) Do not intubate. Provide all appropriate non-invasive medical interventions. Avoid ICU transfer unless indicated or required.   Consent: Discussion  documented in EHR or advanced directives reviewed      05/12/23 1300           Code Status History     Date Active Date Inactive Code Status Order ID Comments User Context   05/12/2023 1244 05/12/2023 1300 Full Code [id number]  Seena Marsa NOVAK, MD ED   10/11/2022 1820 10/16/2022 2240 DNR [id number]  Judeth Trenda BIRCH, MD ED         IV Access:   Peripheral IV   Procedures and diagnostic studies:   No results found.   Medical Consultants:   None.   Subjective:    Tanner Bond no complaints this morning sleepy but woke up says he wants to go back to sleep  Objective:    Vitals:   05/15/23 1003 05/15/23 1547 05/15/23 2011 05/16/23 0510  BP: 121/74 115/72 117/79 107/71  Pulse: 77 85 78 (!) 56  Resp:  16 17 16   Temp: 98.1 F (36.7 C) 98 F (36.7 C) 97.9 F (36.6 C) (!) 97.5 F (36.4 C)  TempSrc: Oral     SpO2: 98% 99% 97% 97%  Weight:      Height:       SpO2: 97 %   Intake/Output Summary (Last 24 hours) at 05/16/2023 0805 Last data filed at 05/16/2023 0223 Gross per 24 hour  Intake 860 ml  Output --  Net 860 ml   Filed Weights   05/13/23 0911  Weight: 75.6 kg    Exam: General exam: In no acute distress. Respiratory system: Good air movement and clear to auscultation. Cardiovascular system: S1 & S2 heard, RRR. No JVD. Gastrointestinal  system: Abdomen is nondistended, soft and nontender.  Extremities: No pedal edema. Skin: No rashes, lesions or ulcers   Data Reviewed:    Labs: Basic Metabolic Panel: Recent Labs  Lab 05/12/23 1013 05/13/23 0701 05/13/23 1836 05/14/23 0932 05/15/23 0502  NA 140 139  --  139 139  K 3.6 3.1*  --  3.5 3.3*  CL 106 102  --  104 104  CO2 26 26  --  26 26  GLUCOSE 108* 108*  --  107* 108*  BUN 17 14  --  14 14  CREATININE 0.73 0.79 0.76 0.75 0.63  CALCIUM 8.7* 8.8*  --  8.6* 8.6*   GFR Estimated Creatinine Clearance: 93.2 mL/min (by C-G formula based on SCr of 0.63 mg/dL). Liver Function Tests: No  results for input(s): AST, ALT, ALKPHOS, BILITOT, PROT, ALBUMIN in the last 168 hours. No results for input(s): LIPASE, AMYLASE in the last 168 hours. No results for input(s): AMMONIA in the last 168 hours. Coagulation profile No results for input(s): INR, PROTIME in the last 168 hours. COVID-19 Labs  No results for input(s): DDIMER, FERRITIN, LDH, CRP in the last 72 hours.  No results found for: SARSCOV2NAA  CBC: Recent Labs  Lab 05/12/23 1013 05/13/23 0701 05/13/23 1836 05/14/23 0443 05/15/23 0502  WBC 11.0* 9.9 11.3* 9.6 9.0  NEUTROABS  --   --   --   --  6.9  HGB 12.1* 12.4* 12.5* 12.1* 11.7*  HCT 35.9* 37.0* 36.7* 35.8* 33.6*  MCV 102.0* 101.1* 99.7 101.1* 98.2  PLT 153 167 160 165 180   Cardiac Enzymes: No results for input(s): CKTOTAL, CKMB, CKMBINDEX, TROPONINI in the last 168 hours. BNP (last 3 results) No results for input(s): PROBNP in the last 8760 hours. CBG: No results for input(s): GLUCAP in the last 168 hours. D-Dimer: No results for input(s): DDIMER in the last 72 hours. Hgb A1c: No results for input(s): HGBA1C in the last 72 hours. Lipid Profile: No results for input(s): CHOL, HDL, LDLCALC, TRIG, CHOLHDL, LDLDIRECT in the last 72 hours. Thyroid function studies: No results for input(s): TSH, T4TOTAL, T3FREE, THYROIDAB in the last 72 hours.  Invalid input(s): FREET3 Anemia work up: Recent Labs    05/15/23 0502  VITAMINB12 498   Sepsis Labs: Recent Labs  Lab 05/13/23 0701 05/13/23 1836 05/14/23 0443 05/15/23 0502  WBC 9.9 11.3* 9.6 9.0   Microbiology Recent Results (from the past 240 hours)  Surgical pcr screen     Status: Abnormal   Collection Time: 05/12/23  9:30 PM   Specimen: Nasal Mucosa; Nasal Swab  Result Value Ref Range Status   MRSA, PCR NEGATIVE NEGATIVE Final   Staphylococcus aureus POSITIVE (A) NEGATIVE Final    Comment: (NOTE) The Xpert SA Assay (FDA  approved for NASAL specimens in patients 60 years of age and older), is one component of a comprehensive surveillance program. It is not intended to diagnose infection nor to guide or monitor treatment. Performed at [facility], [address] [place], [place] [address]      Medications:    acetaminophen   1,000 mg Oral TID   busPIRone   10 mg Oral TID   Chlorhexidine  Gluconate Cloth  6 each Topical Daily   donepezil   10 mg Oral QHS   enoxaparin  (LOVENOX ) injection  40 mg Subcutaneous Q24H   feeding supplement  237 mL Oral BID BM   melatonin  3 mg Oral QHS   memantine   5 mg Oral BID  multivitamin with minerals  1 tablet Oral Daily   mupirocin  ointment  1 Application Nasal BID   QUEtiapine   25 mg Oral QHS   senna-docusate  1 tablet Oral QHS   sertraline   100 mg Oral Daily   Continuous Infusions:    LOS: 4 days   Erle Odell Castor  Tanner Bond  05/16/2023, 8:05 AM

## 2023-05-16 NOTE — Plan of Care (Signed)
   Problem: Education: Goal: Knowledge of General Education information will improve Description: Including pain rating scale, medication(s)/side effects and non-pharmacologic comfort measures Outcome: Not Progressing   Problem: Health Behavior/Discharge Planning: Goal: Ability to manage health-related needs will improve Outcome: Not Progressing

## 2023-05-16 NOTE — Progress Notes (Addendum)
 Physical Therapy Treatment Patient Details Name: Tanner Bond MRN: [id number] DOB: 01/24/1956 Today's Date: 05/16/2023   History of Present Illness Patient is a 68 yo male that presents to the hospital for fall resulting in displaced transcervical femur fx s/p hemi arthroplasty.  PMHx: dementia, chronic ETOH abuse, BPH, fatty liver    PT Comments  Pt agreeable to participate. Demonstrates improvement in transitioning to edge of bed with less physical assist. [name] to transfer to chair and worked on sit to stands for functional strengthening. Patient will benefit from continued inpatient follow up therapy, <3 hours/day.    If plan is discharge home, recommend the following: A lot of help with walking and/or transfers;A lot of help with bathing/dressing/bathroom;Assistance with cooking/housework;Assistance with feeding;Direct supervision/assist for medications management;Assist for transportation;Help with stairs or ramp for entrance;Supervision due to cognitive status   Can travel by private vehicle     No  Equipment Recommendations   (defer to next venue)    Recommendations for Other Services       Precautions / Restrictions Precautions Precautions: Posterior Hip;Fall Restrictions Weight Bearing Restrictions Per Provider Order: No LLE Weight Bearing Per Provider Order: Weight bearing as tolerated Other Position/Activity Restrictions: posterior hip precautions     Mobility  Bed Mobility Overal bed mobility: Needs Assistance Bed Mobility: Supine to Sit     Supine to sit: Contact guard     General bed mobility comments: Pt using hands to maneuver LLE off edge of bed    Transfers Overall transfer level: Needs assistance Equipment used: Ambulation equipment used Transfers: Sit to/from Stand Sit to Stand: Via lift equipment, Min assist           General transfer comment: MinA to rise from edge of bed to [place], CGA from elevated flaps Transfer via Lift  Equipment: Stedy  Ambulation/Gait                   Stairs             Wheelchair Mobility     Tilt Bed    Modified Rankin (Stroke Patients Only)       Balance Overall balance assessment: Needs assistance, History of Falls Sitting-balance support: Feet supported, Bilateral upper extremity supported Sitting balance-Leahy Scale: Fair                                      Cognition Arousal: Alert Behavior During Therapy: WFL for tasks assessed/performed Overall Cognitive Status: History of cognitive impairments - at baseline                                 General Comments:  (known dementia)        Exercises Other Exercises Other Exercises: Sit to stands from Stedy x 5    General Comments        Pertinent Vitals/Pain Pain Assessment Pain Assessment: Faces Faces Pain Scale: Hurts little more Pain Location: L hip Pain Descriptors / Indicators: Operative site guarding, Grimacing Pain Intervention(s): Limited activity within patient's tolerance, Monitored during session    Home Living                          Prior Function            PT Goals (current goals can now be found in  the care plan section) Acute Rehab PT Goals Patient Stated Goal: pt unable to state PT Goal Formulation: Patient unable to participate in goal setting Time For Goal Achievement: 05/28/23 Potential to Achieve Goals: Good Progress towards PT goals: Progressing toward goals    Frequency    Min 1X/week      PT Plan      Co-evaluation              AM-PAC PT 6 Clicks Mobility   Outcome Measure  Help needed turning from your back to your side while in a flat bed without using bedrails?: A Little Help needed moving from lying on your back to sitting on the side of a flat bed without using bedrails?: A Little Help needed moving to and from a bed to a chair (including a wheelchair)?: A Lot Help needed standing up from  a chair using your arms (e.g., wheelchair or bedside chair)?: A Lot Help needed to walk in hospital room?: A Lot Help needed climbing 3-5 steps with a railing? : Total 6 Click Score: 13    End of Session Equipment Utilized During Treatment: Gait belt Activity Tolerance: Patient tolerated treatment well Patient left: with call bell/phone within reach;in chair;with chair alarm set Nurse Communication: Mobility status PT Visit Diagnosis: Other abnormalities of gait and mobility (R26.89);Unsteadiness on feet (R26.81);Muscle weakness (generalized) (M62.81);History of falling (Z91.81);Difficulty in walking, not elsewhere classified (R26.2)     Time: 8994-8976 PT Time Calculation (min) (ACUTE ONLY): 18 min  Charges:    $Therapeutic Activity: 8-22 mins PT General Charges $$ ACUTE PT VISIT: 1 Visit                     Aleck Daring, PT, DPT Acute Rehabilitation Services Office [phone]    Alayne ONEIDA Daring 05/16/2023, 4:18 PM

## 2023-05-17 ENCOUNTER — Encounter (HOSPITAL_COMMUNITY): Payer: Self-pay | Admitting: Orthopedic Surgery

## 2023-05-17 DIAGNOSIS — S72002A Fracture of unspecified part of neck of left femur, initial encounter for closed fracture: Secondary | ICD-10-CM | POA: Diagnosis not present

## 2023-05-17 LAB — BASIC METABOLIC PANEL
Anion gap: 7 (ref 5–15)
BUN: 20 mg/dL (ref 8–23)
CO2: 27 mmol/L (ref 22–32)
Calcium: 8.9 mg/dL (ref 8.9–10.3)
Chloride: 106 mmol/L (ref 98–111)
Creatinine, Ser: 0.81 mg/dL (ref 0.61–1.24)
GFR, Estimated: >60 mL/min (ref >60)
Glucose, Bld: 117 mg/dL — ABNORMAL HIGH (ref 70–99)
Potassium: 4.1 mmol/L (ref 3.5–5.1)
Sodium: 140 mmol/L (ref 135–145)

## 2023-05-17 LAB — CBC
HCT: 36 % — ABNORMAL LOW (ref 39.0–52.0)
Hemoglobin: 12.3 g/dL — ABNORMAL LOW (ref 13.0–17.0)
MCH: 34.2 pg — ABNORMAL HIGH (ref 26.0–34.0)
MCHC: 34.2 g/dL (ref 30.0–36.0)
MCV: 100 fL (ref 80.0–100.0)
Platelets: 238 10*3/uL (ref 150–400)
RBC: 3.6 MIL/uL — ABNORMAL LOW (ref 4.22–5.81)
RDW: 11.9 % (ref 11.5–15.5)
WBC: 8.3 10*3/uL (ref 4.0–10.5)
nRBC: 0 % (ref 0.0–0.2)

## 2023-05-17 MED ORDER — POLYETHYLENE GLYCOL 3350 17 G PO PACK: 17.0000 g | PACK | Freq: Every day | ORAL | Status: AC

## 2023-05-17 NOTE — TOC Progression Note (Addendum)
 Transition of Care [facility]) - Progression Note    Patient Details  Name: Ervey Fallin MRN: [id number] Date of Birth: 1955/07/29  [facility]) CM/SW Contact  Bridget Cordella Simmonds, LCSW Phone Number: 05/17/2023, 12:43 PM  Clinical Narrative:  Bed offers provided to wife, she does want to accept offer at [facility].  [facility] has confirmed pt medicaid does cover LTC after rehab.     Auth request submitted in navi and approved: [id number], 3 days: 1/7-1/9.  CSW confirmed with [facility] that they can receive pt today.  MD notified.   Expected Discharge Plan: Skilled Nursing Facility Barriers to Discharge: Continued Medical Work up, Other (must enter comment) (pending PT eval)  Expected Discharge Plan and Services In-house Referral: Clinical Social Work     Living arrangements for the past 2 months: Skilled Nursing Facility [name]                                       Social Determinants of Health (SDOH) Interventions SDOH Screenings   Food Insecurity: No Food Insecurity (05/12/2023)  Housing: Low Risk  (05/12/2023)  Transportation Needs: No Transportation Needs (05/12/2023)  Utilities: Not At Risk (05/12/2023)  Social Connections: Patient Unable To Answer (05/12/2023)  Tobacco Use: High Risk (05/13/2023)    Readmission Risk Interventions     No data to display

## 2023-05-17 NOTE — Discharge Summary (Signed)
 Physician Discharge Summary  Tanner Bond FMW:[id number] DOB: 12-26-1955 DOA: 05/12/2023  PCP: Henry Tanda FORBES Mickey., MD  Admit date: 05/12/2023 Discharge date: 05/17/2023  Admitted From: Home Disposition:  SNF  Recommendations for Outpatient Follow-up:  Follow up with PCP in 1-2 weeks Please obtain BMP/CBC in one week   Home Health:No Equipment/Devices:None  Discharge Condition:Stable CODE STATUS:Full Diet recommendation: Heart Healthy  Brief/Interim Summary:  68 y.o. male past medical history significant for dementia, chronic alcoholism fatty liver BPH has been residing in [facility] for the last 6 months sent to the ED on 05/11/2022 for an unwitnessed fall started complaining of left leg pain, imaging showed mildly displaced neck femoral fracture, CT scan of the head and C-spine showed no acute abnormalities.   Discharge Diagnoses:  Principal Problem:   Closed displaced fracture of neck of left femur (HCC) Active Problems:   Alcoholism in remission (HCC)   Hypertension   Alcoholic liver disease (HCC)   BPH associated with nocturia   Dementia with behavioral disturbance (HCC)  Close displaced fracture of the left femur neck: Orthopedic surgery was consulted he status post hip arthroplasty on 05/12/2018 PT evaluated the patient she will go to skilled nursing [facility]. Orthopedic surgery recommended Lovenox  for DVT prophylaxis for 14 days.  Acute metabolic encephalopathy/underlying dementia Prior to admission she was on Seroquel  sertraline  BuSpar  did not help her Namenda . No changes made to her medication she will continue current regimen.  Hypokalemia: Repleted now resolved.  Microcytic anemia: Follow-up PCP as an outpatient.  Leukocytosis: Likely reactive now improved.     Discharge Instructions  Discharge Instructions     Diet - low sodium heart healthy   Complete by: As directed    Increase activity slowly   Complete by: As directed        Allergies as of 05/17/2023   No Known Allergies      Medication List     TAKE these medications    acetaminophen  500 MG tablet Commonly known as: TYLENOL  Take 2 tablets (1,000 mg total) by mouth every 8 (eight) hours as needed.   busPIRone  10 MG tablet Commonly known as: BUSPAR  Take 10 mg by mouth 3 (three) times daily.   Centrum Silver 50+Men Tabs Take 1 tablet by mouth daily.   chlorhexidine  4 % external liquid Commonly known as: HIBICLENS  Apply 15 mLs (1 Application total) topically as directed for 30 doses. Use as directed daily for 5 days every other week for 6 weeks.   donepezil  10 MG tablet Commonly known as: ARICEPT  Take 10 mg by mouth at bedtime.   enoxaparin  40 MG/0.4ML injection Commonly known as: LOVENOX  Inject 0.4 mLs (40 mg total) into the skin daily for 14 days.   FISH OIL ADULT GUMMIES PO Take 1 capsule by mouth daily.   memantine  5 MG tablet Commonly known as: NAMENDA  Take 5 mg by mouth 2 (two) times daily.   mupirocin  ointment 2 % Commonly known as: BACTROBAN  Place 1 Application into the nose 2 (two) times daily for 60 doses. Use as directed 2 times daily for 5 days every other week for 6 weeks.   NUTRITIONAL SUPPLEMENT PO Take 120 mLs by mouth 2 (two) times daily.   polyethylene glycol 17 g packet Commonly known as: MIRALAX  / GLYCOLAX  Take 17 g by mouth daily for 7 days.   QUEtiapine  25 MG tablet Commonly known as: SEROQUEL  Take 25 mg by mouth at bedtime.   sertraline  100 MG tablet Commonly known as: ZOLOFT   Take 100 mg by mouth daily.   Vitamin D  (Ergocalciferol ) 1.25 MG (50000 UNIT) Caps capsule Commonly known as: DRISDOL  Take 1 capsule (50,000 Units total) by mouth every 7 (seven) days.        Follow-up Information     Celena Sharper, MD. Schedule an appointment as soon as possible for a visit in 2 week(s).   Specialty: Orthopedic Surgery Contact information: [address] [place] [address] [phone]                 No Known Allergies  Consultations: Orthopedic surgery   Procedures/Studies: DG HIP UNILAT W OR W/O PELVIS 2-3 VIEWS LEFT Result Date: 05/13/2023 CLINICAL DATA:  Status post left hip hemiarthroplasty EXAM: DG HIP (WITH OR WITHOUT PELVIS) 2V LEFT COMPARISON:  05/12/2023 FINDINGS: Left hip hemiarthroplasty is now seen. Previously noted femoral fracture has been resolved. Stable medullary rod and fixation screw is noted in the proximal right femur. Pelvic ring as visualized is within normal limits. IMPRESSION: Status post left hip hemiarthroplasty. Electronically Signed   By: Oneil Devonshire M.D.   On: 05/13/2023 18:01   DG Knee Complete 4 Views Left Result Date: 05/12/2023 CLINICAL DATA:  Hip fracture.  Fall. EXAM: LEFT KNEE - COMPLETE 4+ VIEW COMPARISON:  None Available. FINDINGS: No acute fracture or dislocation. The joint spaces are preserved. No significant osteophytes. No erosion or focal bone abnormality. No large joint effusion. Vascular calcifications are seen. IMPRESSION: No acute fracture or dislocation of the left knee. Electronically Signed   By: Andrea Gasman M.D.   On: 05/12/2023 15:19   CT Head Wo Contrast Result Date: 05/12/2023 CLINICAL DATA:  Unwitnessed fall.  Head and neck trauma. EXAM: CT HEAD WITHOUT CONTRAST CT CERVICAL SPINE WITHOUT CONTRAST TECHNIQUE: Multidetector CT imaging of the head and cervical spine was performed following the standard protocol without intravenous contrast. Multiplanar CT image reconstructions of the cervical spine were also generated. RADIATION DOSE REDUCTION: This exam was performed according to the departmental dose-optimization program which includes automated exposure control, adjustment of the mA and/or kV according to patient size and/or use of iterative reconstruction technique. COMPARISON:  CT scan head from 10/12/2022 and CT scan cervical spine from 10/11/2022 FINDINGS: CT HEAD FINDINGS Brain: No evidence of acute infarction,  hemorrhage, hydrocephalus, extra-axial collection or mass lesion/mass effect. There is bilateral periventricular hypodensity, which is non-specific but most likely seen in the settings of microvascular ischemic changes. Mild in extent. Otherwise normal appearance of brain parenchyma. Ventricles are normal. Cerebral volume is age appropriate. Vascular: No hyperdense vessel or unexpected calcification. Intracranial arteriosclerosis. Skull: Normal. Negative for fracture or focal lesion. Sinuses/Orbits: No acute finding. Other: Visualized mastoid air cells are unremarkable. No mastoid effusion. CT CERVICAL SPINE FINDINGS Alignment: There is loss of cervical lordosis. There is grade 1 retrolisthesis of C4 over C5, C5 over C6 and C6 over C7, most likely degenerative. This examination does not assess for ligamentous injury or stability. Skull base and vertebrae: No acute fracture. No primary bone lesion or focal pathologic process. Soft tissues and spinal canal: No prevertebral fluid or swelling. No visible canal hematoma. Disc levels: There are moderate-to-severe multilevel degenerative changes characterized by reduced intervertebral disc height (most pronounced at C5-C6 level), vacuum phenomena, facet arthropathy and marginal osteophyte formation. Upper chest: Negative. Other: None. IMPRESSION: 1. No acute intracranial abnormality. 2. No acute osseous injury or traumatic listhesis of the cervical spine. 3. Multiple other nonacute observations, as described above. Electronically Signed   By: Ree Molt  M.D.   On: 05/12/2023 08:21   CT Cervical Spine Wo Contrast Result Date: 05/12/2023 CLINICAL DATA:  Unwitnessed fall.  Head and neck trauma. EXAM: CT HEAD WITHOUT CONTRAST CT CERVICAL SPINE WITHOUT CONTRAST TECHNIQUE: Multidetector CT imaging of the head and cervical spine was performed following the standard protocol without intravenous contrast. Multiplanar CT image reconstructions of the cervical spine were also  generated. RADIATION DOSE REDUCTION: This exam was performed according to the departmental dose-optimization program which includes automated exposure control, adjustment of the mA and/or kV according to patient size and/or use of iterative reconstruction technique. COMPARISON:  CT scan head from 10/12/2022 and CT scan cervical spine from 10/11/2022 FINDINGS: CT HEAD FINDINGS Brain: No evidence of acute infarction, hemorrhage, hydrocephalus, extra-axial collection or mass lesion/mass effect. There is bilateral periventricular hypodensity, which is non-specific but most likely seen in the settings of microvascular ischemic changes. Mild in extent. Otherwise normal appearance of brain parenchyma. Ventricles are normal. Cerebral volume is age appropriate. Vascular: No hyperdense vessel or unexpected calcification. Intracranial arteriosclerosis. Skull: Normal. Negative for fracture or focal lesion. Sinuses/Orbits: No acute finding. Other: Visualized mastoid air cells are unremarkable. No mastoid effusion. CT CERVICAL SPINE FINDINGS Alignment: There is loss of cervical lordosis. There is grade 1 retrolisthesis of C4 over C5, C5 over C6 and C6 over C7, most likely degenerative. This examination does not assess for ligamentous injury or stability. Skull base and vertebrae: No acute fracture. No primary bone lesion or focal pathologic process. Soft tissues and spinal canal: No prevertebral fluid or swelling. No visible canal hematoma. Disc levels: There are moderate-to-severe multilevel degenerative changes characterized by reduced intervertebral disc height (most pronounced at C5-C6 level), vacuum phenomena, facet arthropathy and marginal osteophyte formation. Upper chest: Negative. Other: None. IMPRESSION: 1. No acute intracranial abnormality. 2. No acute osseous injury or traumatic listhesis of the cervical spine. 3. Multiple other nonacute observations, as described above. Electronically Signed   By: Ree Molt M.D.    On: 05/12/2023 08:21   DG Hip Unilat W or Wo Pelvis 2-3 Views Left Result Date: 05/12/2023 CLINICAL DATA:  Fall.  Left leg pain. EXAM: DG HIP (WITH OR WITHOUT PELVIS) 2-3V LEFT COMPARISON:  None Available. FINDINGS: There is mildly displaced and proximally migrated transcervical fracture of left femoral neck. No other acute fracture or dislocation. No aggressive osseous lesion. Metallic hardware noted in the proximal right femur. There is small surrounding heterotopic ossification. Visualized sacral arcuate lines are unremarkable. There are changes of chronic pubic symphisitis. There are mild degenerative changes of bilateral hip joints without significant joint space narrowing. Osteophytosis of the superior acetabulum. No radiopaque foreign bodies. IMPRESSION: *Mildly displaced transcervical left femoral neck fracture. Electronically Signed   By: Ree Molt M.D.   On: 05/12/2023 08:12   (Echo, Carotid, EGD, Colonoscopy, ERCP)    Subjective: No complaints  Discharge Exam: Vitals:   05/17/23 0600 05/17/23 0736  BP: 125/72 (!) 114/92  Pulse: 65 72  Resp: 16 18  Temp: 98 F (36.7 C) 97.8 F (36.6 C)  SpO2: 100% 97%   Vitals:   05/16/23 2119 05/17/23 0031 05/17/23 0600 05/17/23 0736  BP: 126/87 118/69 125/72 (!) 114/92  Pulse: 69 61 65 72  Resp: 16 15 16 18   Temp: 97.6 F (36.4 C) 97.9 F (36.6 C) 98 F (36.7 C) 97.8 F (36.6 C)  TempSrc: Oral Axillary Axillary Axillary  SpO2: 98% 97% 100% 97%  Weight:      Height:  General: Pt is alert, awake, not in acute distress Cardiovascular: RRR, S1/S2 +, no rubs, no gallops Respiratory: CTA bilaterally, no wheezing, no rhonchi Abdominal: Soft, NT, ND, bowel sounds + Extremities: no edema, no cyanosis    The results of significant diagnostics from this hospitalization (including imaging, microbiology, ancillary and laboratory) are listed below for reference.     Microbiology: Recent Results (from the past 240 hours)   Surgical pcr screen     Status: Abnormal   Collection Time: 05/12/23  9:30 PM   Specimen: Nasal Mucosa; Nasal Swab  Result Value Ref Range Status   MRSA, PCR NEGATIVE NEGATIVE Final   Staphylococcus aureus POSITIVE (A) NEGATIVE Final    Comment: (NOTE) The Xpert SA Assay (FDA approved for NASAL specimens in patients 23 years of age and older), is one component of a comprehensive surveillance program. It is not intended to diagnose infection nor to guide or monitor treatment. Performed at [facility], [address] [place], [place] [address]      Labs: BNP (last 3 results) No results for input(s): BNP in the last 8760 hours. Basic Metabolic Panel: Recent Labs  Lab 05/12/23 1013 05/13/23 0701 05/13/23 1836 05/14/23 0932 05/15/23 0502  NA 140 139  --  139 139  K 3.6 3.1*  --  3.5 3.3*  CL 106 102  --  104 104  CO2 26 26  --  26 26  GLUCOSE 108* 108*  --  107* 108*  BUN 17 14  --  14 14  CREATININE 0.73 0.79 0.76 0.75 0.63  CALCIUM 8.7* 8.8*  --  8.6* 8.6*   Liver Function Tests: No results for input(s): AST, ALT, ALKPHOS, BILITOT, PROT, ALBUMIN in the last 168 hours. No results for input(s): LIPASE, AMYLASE in the last 168 hours. No results for input(s): AMMONIA in the last 168 hours. CBC: Recent Labs  Lab 05/13/23 0701 05/13/23 1836 05/14/23 0443 05/15/23 0502 05/17/23 0930  WBC 9.9 11.3* 9.6 9.0 8.3  NEUTROABS  --   --   --  6.9  --   HGB 12.4* 12.5* 12.1* 11.7* 12.3*  HCT 37.0* 36.7* 35.8* 33.6* 36.0*  MCV 101.1* 99.7 101.1* 98.2 100.0  PLT 167 160 165 180 238   Cardiac Enzymes: No results for input(s): CKTOTAL, CKMB, CKMBINDEX, TROPONINI in the last 168 hours. BNP: Invalid input(s): POCBNP CBG: No results for input(s): GLUCAP in the last 168 hours. D-Dimer No results for input(s): DDIMER in the last 72 hours. Hgb A1c No results for input(s): HGBA1C in the last 72 hours. Lipid Profile No results for  input(s): CHOL, HDL, LDLCALC, TRIG, CHOLHDL, LDLDIRECT in the last 72 hours. Thyroid function studies No results for input(s): TSH, T4TOTAL, T3FREE, THYROIDAB in the last 72 hours.  Invalid input(s): FREET3 Anemia work up Recent Labs    05/15/23 0502  VITAMINB12 498   Urinalysis    Component Value Date/Time   BILIRUBINUR Negative 07/05/2017 1140   PROTEINUR Negative 07/05/2017 1140   UROBILINOGEN 0.2 07/05/2017 1140   NITRITE Negative 07/05/2017 1140   LEUKOCYTESUR Negative 07/05/2017 1140   Sepsis Labs Recent Labs  Lab 05/13/23 1836 05/14/23 0443 05/15/23 0502 05/17/23 0930  WBC 11.3* 9.6 9.0 8.3   Microbiology Recent Results (from the past 240 hours)  Surgical pcr screen     Status: Abnormal   Collection Time: 05/12/23  9:30 PM   Specimen: Nasal Mucosa; Nasal Swab  Result Value Ref Range Status   MRSA, PCR NEGATIVE NEGATIVE  Final   Staphylococcus aureus POSITIVE (A) NEGATIVE Final    Comment: (NOTE) The Xpert SA Assay (FDA approved for NASAL specimens in patients 29 years of age and older), is one component of a comprehensive surveillance program. It is not intended to diagnose infection nor to guide or monitor treatment. Performed at [facility], [address], [place], [place] [address]      Time coordinating discharge: Over 35 minutes  SIGNED:   Erle Odell Castor, MD  [facility] 05/17/2023, 1:26 PM Pager   If 7PM-7AM, please contact night-coverage [email or web address] Password [email or web address]

## 2023-05-17 NOTE — Anesthesia Postprocedure Evaluation (Signed)
 Anesthesia Post Note  Patient: Tanner Bond  Procedure(s) Performed: ARTHROPLASTY BIPOLAR HIP (HEMIARTHROPLASTY) (Left: Hip)     Patient location during evaluation: PACU Anesthesia Type: MAC, General and Spinal Level of consciousness: confused and patient cooperative Pain management: pain level controlled Vital Signs Assessment: post-procedure vital signs reviewed and stable Respiratory status: spontaneous breathing, nonlabored ventilation, respiratory function stable and patient connected to nasal cannula oxygen Cardiovascular status: blood pressure returned to baseline and stable Postop Assessment: no apparent nausea or vomiting Anesthetic complications: no   No notable events documented.                Erine Phenix

## 2023-05-17 NOTE — TOC Transition Note (Signed)
 Transition of Care [facility]) - Discharge Note   Patient Details  Name: Tanner Bond MRN: [id number] Date of Birth: [birth year]  Transition of Care [facility]) CM/SW Contact:  Bridget Cordella Simmonds, LCSW Phone Number: 05/17/2023, 2:07 PM   Clinical Narrative:   Pt discharging to [facility], room 103.  RN call report to [phone].     Final next level of care: Skilled Nursing Facility Barriers to Discharge: Barriers Resolved   Patient Goals and CMS Choice     Choice offered to / list presented to : Spouse (wife Tanner Bond)      Discharge Placement              Patient chooses bed at: [facility] Patient to be transferred to facility by: ptar Name of family member notified: wife Tanner Bond Patient and family notified of of transfer: 05/17/23  Discharge Plan and Services Additional resources added to the After Visit Summary for   In-house Referral: Clinical Social Work                                   Social Drivers of Health (SDOH) Interventions SDOH Screenings   Food Insecurity: No Food Insecurity (05/12/2023)  Housing: Low Risk  (05/12/2023)  Transportation Needs: No Transportation Needs (05/12/2023)  Utilities: Not At Risk (05/12/2023)  Social Connections: Patient Unable To Answer (05/12/2023)  Tobacco Use: High Risk (05/13/2023)     Readmission Risk Interventions     No data to display

## 2023-05-17 NOTE — Progress Notes (Signed)
 [facility] PROGRESS NOTE    Progress Note  Tanner Bond  FMW:[id number] DOB: 06/06/1955 DOA: 05/12/2023 PCP: Henry Tanda FORBES Mickey., MD     Brief Narrative:   Tanner Bond is an 68 y.o. male past medical history significant for dementia, chronic alcoholism fatty liver BPH has been residing in [facility] for the last 6 months sent to the ED on 05/11/2022 for an unwitnessed fall started complaining of left leg pain, imaging showed mildly displaced neck femoral fracture, CT scan of the head and C-spine showed no acute abnormalities. Orthopedic surgery was consulted   Assessment/Plan:   Closed displaced fracture of neck of left femur (HCC) Status post left hip arthroplasty on 05/13/2023. Analgesics and anticoagulation per orthopedic surgery. PT evaluated the patient. Awaiting skilled nursing facility placement.  Acute metabolic encephalopathy/underlying dementia: Prior to admission he was on Seroquel , sertraline  BuSpar  donezepil Namenda . Placed on melatonin here at night. Discontinue Ativan  Haldol  as needed.  Hypokalemia: Potassium still low replete orally recheck in the morning.  Macrocytic anemia: Follow-up PCP as an outpatient.  Leukocytosis: Likely reactive now improved.   DVT prophylaxis: lovenox  Family Communication:none Status is: Inpatient Remains inpatient appropriate because: Still confused came in with an acute hip fracture    Code Status:     Code Status Orders  (From admission, onward)           Start     Ordered   05/12/23 1300  Do not attempt resuscitation (DNR)- Limited -Do Not Intubate (DNI)  (Code Status)  Continuous       Question Answer Comment  If pulseless and not breathing No CPR or chest compressions.   In Pre-Arrest Conditions (Patient Is Breathing and Has A Pulse) Do not intubate. Provide all appropriate non-invasive medical interventions. Avoid ICU transfer unless indicated or required.   Consent: Discussion  documented in EHR or advanced directives reviewed      05/12/23 1300           Code Status History     Date Active Date Inactive Code Status Order ID Comments User Context   05/12/2023 1244 05/12/2023 1300 Full Code [id number]  Seena Marsa NOVAK, MD ED   10/11/2022 1820 10/16/2022 2240 DNR [id number]  Judeth Trenda BIRCH, MD ED         IV Access:   Peripheral IV   Procedures and diagnostic studies:   No results found.   Medical Consultants:   None.   Subjective:    Tanner Bond no complaints  Objective:    Vitals:   05/16/23 2119 05/17/23 0031 05/17/23 0600 05/17/23 0736  BP: 126/87 118/69 125/72 (!) 114/92  Pulse: 69 61 65 72  Resp: 16 15 16 18   Temp: 97.6 F (36.4 C) 97.9 F (36.6 C) 98 F (36.7 C) 97.8 F (36.6 C)  TempSrc: Oral Axillary Axillary Axillary  SpO2: 98% 97% 100% 97%  Weight:      Height:       SpO2: 97 %   Intake/Output Summary (Last 24 hours) at 05/17/2023 0857 Last data filed at 05/17/2023 0600 Gross per 24 hour  Intake --  Output 450 ml  Net -450 ml   Filed Weights   05/13/23 0911  Weight: 75.6 kg    Exam: General exam: In no acute distress. Respiratory system: Good air movement and clear to auscultation. Cardiovascular system: S1 & S2 heard, RRR. No JVD. Gastrointestinal system: Abdomen is nondistended, soft and nontender.  Extremities: No pedal edema. Skin: No rashes, lesions  or ulcers Psychiatry: Judgement and insight appear normal. Mood & affect appropriate. Data Reviewed:    Labs: Basic Metabolic Panel: Recent Labs  Lab 05/12/23 1013 05/13/23 0701 05/13/23 1836 05/14/23 0932 05/15/23 0502  NA 140 139  --  139 139  K 3.6 3.1*  --  3.5 3.3*  CL 106 102  --  104 104  CO2 26 26  --  26 26  GLUCOSE 108* 108*  --  107* 108*  BUN 17 14  --  14 14  CREATININE 0.73 0.79 0.76 0.75 0.63  CALCIUM 8.7* 8.8*  --  8.6* 8.6*   GFR Estimated Creatinine Clearance: 93.2 mL/min (by C-G formula based on SCr of 0.63  mg/dL). Liver Function Tests: No results for input(s): AST, ALT, ALKPHOS, BILITOT, PROT, ALBUMIN in the last 168 hours. No results for input(s): LIPASE, AMYLASE in the last 168 hours. No results for input(s): AMMONIA in the last 168 hours. Coagulation profile No results for input(s): INR, PROTIME in the last 168 hours. COVID-19 Labs  No results for input(s): DDIMER, FERRITIN, LDH, CRP in the last 72 hours.  No results found for: SARSCOV2NAA  CBC: Recent Labs  Lab 05/12/23 1013 05/13/23 0701 05/13/23 1836 05/14/23 0443 05/15/23 0502  WBC 11.0* 9.9 11.3* 9.6 9.0  NEUTROABS  --   --   --   --  6.9  HGB 12.1* 12.4* 12.5* 12.1* 11.7*  HCT 35.9* 37.0* 36.7* 35.8* 33.6*  MCV 102.0* 101.1* 99.7 101.1* 98.2  PLT 153 167 160 165 180   Cardiac Enzymes: No results for input(s): CKTOTAL, CKMB, CKMBINDEX, TROPONINI in the last 168 hours. BNP (last 3 results) No results for input(s): PROBNP in the last 8760 hours. CBG: No results for input(s): GLUCAP in the last 168 hours. D-Dimer: No results for input(s): DDIMER in the last 72 hours. Hgb A1c: No results for input(s): HGBA1C in the last 72 hours. Lipid Profile: No results for input(s): CHOL, HDL, LDLCALC, TRIG, CHOLHDL, LDLDIRECT in the last 72 hours. Thyroid function studies: No results for input(s): TSH, T4TOTAL, T3FREE, THYROIDAB in the last 72 hours.  Invalid input(s): FREET3 Anemia work up: Recent Labs    05/15/23 0502  VITAMINB12 498   Sepsis Labs: Recent Labs  Lab 05/13/23 0701 05/13/23 1836 05/14/23 0443 05/15/23 0502  WBC 9.9 11.3* 9.6 9.0   Microbiology Recent Results (from the past 240 hours)  Surgical pcr screen     Status: Abnormal   Collection Time: 05/12/23  9:30 PM   Specimen: Nasal Mucosa; Nasal Swab  Result Value Ref Range Status   MRSA, PCR NEGATIVE NEGATIVE Final   Staphylococcus aureus POSITIVE (A) NEGATIVE Final    Comment:  (NOTE) The Xpert SA Assay (FDA approved for NASAL specimens in patients 17 years of age and older), is one component of a comprehensive surveillance program. It is not intended to diagnose infection nor to guide or monitor treatment. Performed at [facility], [address] , [place] [address]      Medications:    acetaminophen   1,000 mg Oral TID   busPIRone   10 mg Oral TID   Chlorhexidine  Gluconate Cloth  6 each Topical Daily   donepezil   10 mg Oral QHS   enoxaparin  (LOVENOX ) injection  40 mg Subcutaneous Q24H   feeding supplement  237 mL Oral BID BM   melatonin  3 mg Oral QHS   memantine   5 mg Oral BID   multivitamin with minerals  1 tablet Oral Daily  mupirocin  ointment  1 Application Nasal BID   polyethylene glycol  17 g Oral BID   QUEtiapine   25 mg Oral QHS   senna-docusate  1 tablet Oral QHS   sertraline   100 mg Oral Daily   Continuous Infusions:    LOS: 5 days   Tanner Bond  [facility]  05/17/2023, 8:57 AM

## 2023-05-17 NOTE — Discharge Planning (Signed)
 Patient alert. IV access removed. Discharge teaching given to [facility]. Discharge summary placed in discharge packet along with signed DNR. Patient will be transported via [facility].

## 2023-05-17 NOTE — Progress Notes (Signed)
 Occupational Therapy Treatment Patient Details Name: Tanner Bond MRN: [id number] DOB: 01-14-1956 Today's Date: 05/17/2023   History of present illness Patient is a 68 yo male that presents to the hospital for fall resulting in displaced transcervical femur fx s/p hemi arthroplasty.  PMHx: dementia, chronic ETOH abuse, BPH, fatty liver   OT comments  Pt supine in bed, oriented to self and follows most simple 1 step commands.  Requires reminders for safety, with mild impulsivity and no awareness to L posterior hip precautions.  He requires min assist for bed mobility and transfers into standing; setup for grooming at EOB but total assist for LB dressing.  Continue to recommend <3hrs/day inpatient setting at dc. Will follow.       If plan is discharge home, recommend the following:  A lot of help with bathing/dressing/bathroom;Assistance with cooking/housework;Assistance with feeding;Assist for transportation;Help with stairs or ramp for entrance;A lot of help with walking and/or transfers;Direct supervision/assist for medications management;Direct supervision/assist for financial management;Supervision due to cognitive status   Equipment Recommendations  Other (comment) (defer)    Recommendations for Other Services      Precautions / Restrictions Precautions Precautions: Posterior Hip;Fall Precaution Comments: reviewed with pt, no awareness to or understanding of precautions Restrictions Weight Bearing Restrictions Per Provider Order: Yes LLE Weight Bearing Per Provider Order: Weight bearing as tolerated Other Position/Activity Restrictions: posterior hip precautions, pillow between kneees at rest       Mobility Bed Mobility Overal bed mobility: Needs Assistance Bed Mobility: Supine to Sit, Sit to Supine     Supine to sit: Min assist Sit to supine: Min assist   General bed mobility comments: min assist for trunk support and scoooting, min assist for L LE back to bed     Transfers Overall transfer level: Needs assistance Equipment used: Rolling walker (2 wheels) Transfers: Sit to/from Stand             General transfer comment: min assist to stand from elevated EOB, cueing for tehcnique and hand placement     Balance Overall balance assessment: Needs assistance, History of Falls Sitting-balance support: Feet supported, No upper extremity supported Sitting balance-Leahy Scale: Fair     Standing balance support: Bilateral upper extremity supported, During functional activity Standing balance-Leahy Scale: Poor Standing balance comment: relies on external support and RW                           ADL either performed or assessed with clinical judgement   ADL Overall ADL's : Needs assistance/impaired     Grooming: Set up;Wash/dry face;Sitting               Lower Body Dressing: Total assistance;Sit to/from Market Researcher Details (indicate cue type and reason): deferred, stood at EOB and completed lateral scoots towards [facility] but did not transfer today due to safety/restlessness         Functional mobility during ADLs: Minimal assistance General ADL Comments: to scoot to [facility] and stand with RW    Extremity/Trunk Assessment              Vision       Perception     Praxis      Cognition Arousal: Alert Behavior During Therapy: Restless, Flat affect Overall Cognitive Status: History of cognitive impairments - at baseline  General Comments: hx of dementia, oriented to self only. follows most simple commands with increased time        Exercises      Shoulder Instructions       General Comments placed pillow between knees to avoid adduction and internal rotation of L hip/LE    Pertinent Vitals/ Pain       Pain Assessment Pain Assessment: Faces Faces Pain Scale: Hurts little more Pain Location: L hip Pain Descriptors / Indicators: Operative site  guarding, Grimacing Pain Intervention(s): Limited activity within patient's tolerance, Monitored during session, Repositioned  Home Living                                          Prior Functioning/Environment              Frequency  Min 1X/week        Progress Toward Goals  OT Goals(current goals can now be found in the care plan section)  Progress towards OT goals: Progressing toward goals  Acute Rehab OT Goals Time For Goal Achievement: 05/28/23 Potential to Achieve Goals: Good  Plan      Co-evaluation                 AM-PAC OT 6 Clicks Daily Activity     Outcome Measure   Help from another person eating meals?: A Little Help from another person taking care of personal grooming?: A Little Help from another person toileting, which includes using toliet, bedpan, or urinal?: A Lot Help from another person bathing (including washing, rinsing, drying)?: A Lot Help from another person to put on and taking off regular upper body clothing?: A Little Help from another person to put on and taking off regular lower body clothing?: Total 6 Click Score: 14    End of Session Equipment Utilized During Treatment: Gait belt;Rolling walker (2 wheels)  OT Visit Diagnosis: Unsteadiness on feet (R26.81);Repeated falls (R29.6);History of falling (Z91.81);Muscle weakness (generalized) (M62.81)   Activity Tolerance Patient tolerated treatment well   Patient Left in bed;with call bell/phone within reach;with bed alarm set;with restraints reapplied   Nurse Communication Mobility status        Time: 8897-8870 OT Time Calculation (min): 27 min  Charges: OT General Charges $OT Visit: 1 Visit OT Treatments $Self Care/Home Management : 23-37 mins  Etta NOVAK, OT Acute Rehabilitation Services Office [phone]   Etta GORMAN Hope 05/17/2023, 12:54 PM

## 2023-07-22 ENCOUNTER — Encounter (HOSPITAL_COMMUNITY): Payer: Self-pay | Admitting: Emergency Medicine

## 2023-07-22 ENCOUNTER — Emergency Department (HOSPITAL_COMMUNITY)
Admission: EM | Admit: 2023-07-22 | Discharge: 2023-07-22 | Disposition: A | Discharge status: Home / Self Care | Attending: Student | Admitting: Student

## 2023-07-22 ENCOUNTER — Emergency Department (HOSPITAL_COMMUNITY)

## 2023-07-22 ENCOUNTER — Other Ambulatory Visit: Payer: Self-pay

## 2023-07-22 DIAGNOSIS — F0390 Unspecified dementia without behavioral disturbance: Secondary | ICD-10-CM | POA: Diagnosis not present

## 2023-07-22 DIAGNOSIS — R7309 Other abnormal glucose: Secondary | ICD-10-CM | POA: Insufficient documentation

## 2023-07-22 DIAGNOSIS — W19XXXA Unspecified fall, initial encounter: Secondary | ICD-10-CM

## 2023-07-22 DIAGNOSIS — S0990XA Unspecified injury of head, initial encounter: Secondary | ICD-10-CM | POA: Diagnosis present

## 2023-07-22 LAB — CBC WITH DIFFERENTIAL/PLATELET
Abs Immature Granulocytes: 0.04 10*3/uL (ref 0.00–0.07)
Basophils Absolute: 0.1 10*3/uL (ref 0.0–0.1)
Basophils Relative: 1 %
Eosinophils Absolute: 0.2 10*3/uL (ref 0.0–0.5)
Eosinophils Relative: 2 %
HCT: 43 % (ref 39.0–52.0)
Hemoglobin: 14.4 g/dL (ref 13.0–17.0)
Immature Granulocytes: 0 %
Lymphocytes Relative: 13 %
Lymphs Abs: 1.6 10*3/uL (ref 0.7–4.0)
MCH: 32.7 pg (ref 26.0–34.0)
MCHC: 33.5 g/dL (ref 30.0–36.0)
MCV: 97.7 fL (ref 80.0–100.0)
Monocytes Absolute: 0.9 10*3/uL (ref 0.1–1.0)
Monocytes Relative: 7 %
Neutro Abs: 10 10*3/uL — ABNORMAL HIGH (ref 1.7–7.7)
Neutrophils Relative %: 77 %
Platelets: 241 10*3/uL (ref 150–400)
RBC: 4.4 MIL/uL (ref 4.22–5.81)
RDW: 12.6 % (ref 11.5–15.5)
WBC: 12.8 10*3/uL — ABNORMAL HIGH (ref 4.0–10.5)
nRBC: 0 % (ref 0.0–0.2)

## 2023-07-22 LAB — COMPREHENSIVE METABOLIC PANEL
ALT: 16 U/L (ref 0–44)
AST: 24 U/L (ref 15–41)
Albumin: 3.4 g/dL — ABNORMAL LOW (ref 3.5–5.0)
Alkaline Phosphatase: 95 U/L (ref 38–126)
Anion gap: 7 (ref 5–15)
BUN: 17 mg/dL (ref 8–23)
CO2: 28 mmol/L (ref 22–32)
Calcium: 9.1 mg/dL (ref 8.9–10.3)
Chloride: 105 mmol/L (ref 98–111)
Creatinine, Ser: 0.73 mg/dL (ref 0.61–1.24)
GFR, Estimated: >60 mL/min (ref >60)
Glucose, Bld: 103 mg/dL — ABNORMAL HIGH (ref 70–99)
Potassium: 4 mmol/L (ref 3.5–5.1)
Sodium: 140 mmol/L (ref 135–145)
Total Bilirubin: 0.6 mg/dL (ref 0.0–1.2)
Total Protein: 6.6 g/dL (ref 6.5–8.1)

## 2023-07-22 LAB — CBG MONITORING, ED: Glucose-Capillary: 100 mg/dL — ABNORMAL HIGH (ref 70–99)

## 2023-07-22 NOTE — ED Notes (Signed)
 Report given to [facility]

## 2023-07-22 NOTE — ED Notes (Signed)
 [facility] called for transfer back to [facility]

## 2023-07-22 NOTE — ED Notes (Signed)
[facility] called no eta

## 2023-07-22 NOTE — ED Triage Notes (Signed)
 Pt BIB by [facility] for unwitnessed fall. Per EM, pt was found by staff with knot noted to left side of temple. Hx of dementia w/ no alteration in mentation from, per staff. Pt unable to recall events leading up to episode. Denies any pain. No blood thinners.

## 2023-07-22 NOTE — ED Notes (Signed)
 Patient transported to CT

## 2023-07-22 NOTE — ED Provider Notes (Signed)
 [place] EMERGENCY DEPARTMENT AT [facility] Provider Note  CSN: [id number] Arrival date & time: 07/22/23 0831  Chief Complaint(s) Fall  HPI Tanner Bond is a 68 y.o. male with PMH chronic alcoholism, BPH, dementia, recent left femur fracture s/p repair in January 2025 who presents emergency room for evaluation of an unwitnessed fall.  Patient at a skilled nursing facility found on the ground by facility staff this morning.  Patient does not remember the event.  Is alert and oriented answering all questions appropriately in the emergency room today.  Currently has no complaints including chest pain, shortness of breath, abdominal pain, nausea, vomiting, headache, numbness, tingling, weakness or other systemic, traumatic or neurologic complaints.   Past Medical History Past Medical History:  Diagnosis Date   Alcohol abuse    Dementia [facility])    Liver dysfunction    Patient Active Problem List   Diagnosis Date Noted   Closed displaced fracture of neck of left femur (HCC) 05/12/2023   Closed right hip fracture (HCC) 10/11/2022   Intraventricular hemorrhage (HCC) 10/11/2022   Dementia with behavioral disturbance (HCC) 10/11/2022   Hypokalemia 10/11/2022   Tobacco use disorder 10/11/2022   Acne 09/26/2017   BPH associated with nocturia 07/06/2017   Insomnia 07/06/2017   Alcoholic liver disease (HCC) 07/05/2017   Hypertension 04/13/2017   Alcoholism in remission (HCC) 04/12/2017   History of adenomatous polyp of colon 04/12/2017   Home Medication(s) Prior to Admission medications   Medication Sig Start Date End Date Taking? Authorizing Provider  acetaminophen (TYLENOL) 500 MG tablet Take 2 tablets (1,000 mg total) by mouth every 8 (eight) hours as needed. 05/16/23   Montez Morita, PA-C  busPIRone (BUSPAR) 10 MG tablet Take 10 mg by mouth 3 (three) times daily.    [provider]  chlorhexidine (HIBICLENS) 4 % external liquid Apply 15 mLs (1 Application total)  topically as directed for 30 doses. Use as directed daily for 5 days every other week for 6 weeks. 05/13/23   Montez Morita, PA-C  donepezil (ARICEPT) 10 MG tablet Take 10 mg by mouth at bedtime.    [provider]  enoxaparin (LOVENOX) 40 MG/0.4ML injection Inject 0.4 mLs (40 mg total) into the skin daily for 14 days. 05/17/23 05/31/23  Montez Morita, PA-C  memantine (NAMENDA) 5 MG tablet Take 5 mg by mouth 2 (two) times daily. 04/03/23   [provider]  Multiple Vitamins-Minerals (CENTRUM SILVER 50+MEN) TABS Take 1 tablet by mouth daily.    [provider]  Nutritional Supplements (NUTRITIONAL SUPPLEMENT PO) Take 120 mLs by mouth 2 (two) times daily.    [provider]  Omega-3 Fatty Acids (FISH OIL ADULT GUMMIES PO) Take 1 capsule by mouth daily.    [provider]  QUEtiapine (SEROQUEL) 25 MG tablet Take 25 mg by mouth at bedtime.    [provider]  sertraline (ZOLOFT) 100 MG tablet Take 100 mg by mouth daily.    [provider]  Vitamin D, Ergocalciferol, (DRISDOL) 1.25 MG (50000 UNIT) CAPS capsule Take 1 capsule (50,000 Units total) by mouth every 7 (seven) days. 10/20/22   Willeen Niece, MD  Past Surgical History Past Surgical History:  Procedure Laterality Date   APPENDECTOMY     as child   HIP ARTHROPLASTY Left 05/13/2023   Procedure: ARTHROPLASTY BIPOLAR HIP (HEMIARTHROPLASTY);  Surgeon: Myrene Galas, MD;  Location: [facility] OR;  Service: Orthopedics;  Laterality: Left;   INTRAMEDULLARY (IM) NAIL INTERTROCHANTERIC Right 10/12/2022   Procedure: INTRAMEDULLARY (IM) NAIL INTERTROCHANTERIC;  Surgeon: Jodi Geralds, MD;  Location: [facility] OR;  Service: Orthopedics;  Laterality: Right;   URETHRAL DILATION     as child   Family History Family History  Problem Relation Age of Onset   Dementia Mother    Prostate cancer  Father     Social History Social History   Tobacco Use   Smoking status: Some Days    Types: Cigars   Smokeless tobacco: Never  Vaping Use   Vaping status: Never Used  Substance Use Topics   Alcohol use: Yes    Comment: As per spouse report, drinks occasionally.  Last BM 3 weekends ago   Drug use: No   Allergies Patient has no known allergies.  Review of Systems Review of Systems  All other systems reviewed and are negative.   Physical Exam Vital Signs  I have reviewed the triage vital signs BP 105/63   Pulse 67   Temp 98.3 F (36.8 C) (Oral)   Resp 11   SpO2 100%   Physical Exam Constitutional:      General: He is not in acute distress.    Appearance: Normal appearance.  HENT:     Head: Normocephalic.     Comments: Left temporal abrasion    Nose: No congestion or rhinorrhea.  Eyes:     General:        Right eye: No discharge.        Left eye: No discharge.     Extraocular Movements: Extraocular movements intact.     Pupils: Pupils are equal, round, and reactive to light.  Cardiovascular:     Rate and Rhythm: Normal rate and regular rhythm.     Heart sounds: No murmur heard. Pulmonary:     Effort: No respiratory distress.     Breath sounds: No wheezing or rales.  Abdominal:     General: There is no distension.     Tenderness: There is no abdominal tenderness.  Musculoskeletal:        General: Normal range of motion.     Cervical back: Normal range of motion.  Skin:    General: Skin is warm and dry.  Neurological:     General: No focal deficit present.     Mental Status: He is alert.     ED Results and Treatments Labs (all labs ordered are listed, but only abnormal results are displayed) Labs Reviewed  CBC WITH DIFFERENTIAL/PLATELET - Abnormal; Notable for the following components:      Result Value   WBC 12.8 (*)    Neutro Abs 10.0 (*)    All other components within normal limits  CBG MONITORING, ED - Abnormal; Notable for the following  components:   Glucose-Capillary 100 (*)    All other components within normal limits  COMPREHENSIVE METABOLIC PANEL  Radiology No results found.  Pertinent labs & imaging results that were available during my care of the patient were reviewed by me and considered in my medical decision making (see MDM for details).  Medications Ordered in ED Medications - No data to display                                                                                                                                   Procedures Procedures  (including critical care time)  Medical Decision Making / ED Course   This patient presents to the ED for concern of fall, this involves an extensive number of treatment options, and is a complaint that carries with it a high risk of complications and morbidity.  The differential diagnosis includes fracture, contusion, hematoma, ligamentous injury, closed head injury, ICH, laceration, intrathoracic injury, intra-abdominal injury  MDM: Patient seen emergency room for evaluation of a fall.  Physical exam with a mild abrasion over the left temple but is otherwise unremarkable.  Laboratory valuation with a mild leukocytosis to 12.8 but is otherwise unremarkable.  Trauma imaging including CT head, C-spine, chest x-ray and pelvis x-ray negative for acute traumatic injury.  With negative trauma workup he does not meet inpatient criteria for admission and will be discharged with outpatient follow-up.   Additional history obtained:  -External records from outside source obtained and reviewed including: Chart review including previous notes, labs, imaging, consultation notes   Lab Tests: -I ordered, reviewed, and interpreted labs.   The pertinent results include:   Labs Reviewed  CBC WITH DIFFERENTIAL/PLATELET - Abnormal; Notable for the following  components:      Result Value   WBC 12.8 (*)    Neutro Abs 10.0 (*)    All other components within normal limits  CBG MONITORING, ED - Abnormal; Notable for the following components:   Glucose-Capillary 100 (*)    All other components within normal limits  COMPREHENSIVE METABOLIC PANEL      EKG   EKG Interpretation Date/Time:  Friday July 22 2023 08:54:43 EDT Ventricular Rate:  64 PR Interval:  209 QRS Duration:  91 QT Interval:  440 QTC Calculation: 454 R Axis:   63  Text Interpretation: Sinus rhythm Confirmed by Kaisen Ackers ([id number]) on 07/22/2023 9:00:14 AM         Imaging Studies ordered: I ordered imaging studies including CT head, C-spine, chest x-ray, pelvis x-ray I independently visualized and interpreted imaging. I agree with the radiologist interpretation   Medicines ordered and prescription drug management: No orders of the defined types were placed in this encounter.   -I have reviewed the patients home medicines and have made adjustments as needed  Critical interventions none    Cardiac Monitoring: The patient was maintained on a cardiac monitor.  I personally viewed and interpreted the cardiac monitored which showed an underlying rhythm of: NSR  Social Determinants of Health:  Factors impacting patients care  include: Lives in skilled nursing facility   Reevaluation: After the interventions noted above, I reevaluated the patient and found that they have :improved  Co morbidities that complicate the patient evaluation  Past Medical History:  Diagnosis Date   Alcohol abuse    Dementia (HCC)    Liver dysfunction       Dispostion: I considered admission for this patient, but at this time he does not meet inpatient criteria for admission and will be discharged with outpatient follow-up.     Final Clinical Impression(s) / ED Diagnoses Final diagnoses:  None     @PCDICTATION @    Glendora Score, MD 07/22/23 [id number]

## 2023-12-13 ENCOUNTER — Emergency Department (HOSPITAL_COMMUNITY)

## 2023-12-13 ENCOUNTER — Encounter (HOSPITAL_COMMUNITY): Payer: Self-pay

## 2023-12-13 ENCOUNTER — Inpatient Hospital Stay (HOSPITAL_COMMUNITY)
Admission: EM | Admit: 2023-12-13 | Discharge: 2023-12-16 | DRG: 963 | Disposition: A | Discharge status: Skilled Nursing Facility | Source: Skilled Nursing Facility | Attending: Surgery | Admitting: Surgery

## 2023-12-13 DIAGNOSIS — Z96642 Presence of left artificial hip joint: Secondary | ICD-10-CM | POA: Diagnosis present

## 2023-12-13 DIAGNOSIS — S01112A Laceration without foreign body of left eyelid and periocular area, initial encounter: Secondary | ICD-10-CM | POA: Diagnosis present

## 2023-12-13 DIAGNOSIS — Z515 Encounter for palliative care: Secondary | ICD-10-CM

## 2023-12-13 DIAGNOSIS — Z8042 Family history of malignant neoplasm of prostate: Secondary | ICD-10-CM

## 2023-12-13 DIAGNOSIS — S0181XA Laceration without foreign body of other part of head, initial encounter: Secondary | ICD-10-CM | POA: Diagnosis not present

## 2023-12-13 DIAGNOSIS — R296 Repeated falls: Secondary | ICD-10-CM | POA: Diagnosis present

## 2023-12-13 DIAGNOSIS — F1020 Alcohol dependence, uncomplicated: Secondary | ICD-10-CM | POA: Diagnosis present

## 2023-12-13 DIAGNOSIS — Y92129 Unspecified place in nursing home as the place of occurrence of the external cause: Secondary | ICD-10-CM

## 2023-12-13 DIAGNOSIS — F17290 Nicotine dependence, other tobacco product, uncomplicated: Secondary | ICD-10-CM | POA: Diagnosis present

## 2023-12-13 DIAGNOSIS — W19XXXA Unspecified fall, initial encounter: Principal | ICD-10-CM | POA: Diagnosis present

## 2023-12-13 DIAGNOSIS — R64 Cachexia: Secondary | ICD-10-CM | POA: Diagnosis present

## 2023-12-13 DIAGNOSIS — F0390 Unspecified dementia without behavioral disturbance: Secondary | ICD-10-CM | POA: Diagnosis present

## 2023-12-13 DIAGNOSIS — S0635AA Traumatic hemorrhage of left cerebrum with loss of consciousness status unknown, initial encounter: Principal | ICD-10-CM | POA: Diagnosis present

## 2023-12-13 DIAGNOSIS — Z681 Body mass index (BMI) 19 or less, adult: Secondary | ICD-10-CM

## 2023-12-13 DIAGNOSIS — G9340 Encephalopathy, unspecified: Secondary | ICD-10-CM | POA: Diagnosis present

## 2023-12-13 DIAGNOSIS — R402413 Glasgow coma scale score 13-15, at hospital admission: Secondary | ICD-10-CM | POA: Diagnosis present

## 2023-12-13 DIAGNOSIS — E43 Unspecified severe protein-calorie malnutrition: Secondary | ICD-10-CM | POA: Diagnosis present

## 2023-12-13 DIAGNOSIS — Z79899 Other long term (current) drug therapy: Secondary | ICD-10-CM

## 2023-12-13 DIAGNOSIS — Z66 Do not resuscitate: Secondary | ICD-10-CM | POA: Diagnosis present

## 2023-12-13 DIAGNOSIS — S329XXA Fracture of unspecified parts of lumbosacral spine and pelvis, initial encounter for closed fracture: Secondary | ICD-10-CM

## 2023-12-13 DIAGNOSIS — S32591A Other specified fracture of right pubis, initial encounter for closed fracture: Secondary | ICD-10-CM | POA: Diagnosis present

## 2023-12-13 DIAGNOSIS — N400 Enlarged prostate without lower urinary tract symptoms: Secondary | ICD-10-CM | POA: Diagnosis present

## 2023-12-13 DIAGNOSIS — Z993 Dependence on wheelchair: Secondary | ICD-10-CM

## 2023-12-13 DIAGNOSIS — S32511A Fracture of superior rim of right pubis, initial encounter for closed fracture: Secondary | ICD-10-CM | POA: Diagnosis present

## 2023-12-13 DIAGNOSIS — Z9181 History of falling: Secondary | ICD-10-CM

## 2023-12-13 DIAGNOSIS — S06309A Unspecified focal traumatic brain injury with loss of consciousness of unspecified duration, initial encounter: Secondary | ICD-10-CM | POA: Diagnosis present

## 2023-12-13 MED ORDER — LORAZEPAM 2 MG/ML IJ SOLN
1.0000 mg | Freq: Once | INTRAMUSCULAR | Status: AC
  Administered 2023-12-13: 1 mg via INTRAMUSCULAR
  Filled 2023-12-13: qty 1

## 2023-12-13 MED ORDER — LIDOCAINE-EPINEPHRINE-TETRACAINE (LET) TOPICAL GEL
3.0000 mL | Freq: Once | TOPICAL | Status: AC
  Administered 2023-12-13: 3 mL via TOPICAL
  Filled 2023-12-13: qty 3

## 2023-12-13 MED ORDER — HALOPERIDOL LACTATE 5 MG/ML IJ SOLN
2.0000 mg | Freq: Once | INTRAMUSCULAR | Status: AC
  Administered 2023-12-13: 2 mg via INTRAMUSCULAR
  Filled 2023-12-13: qty 1

## 2023-12-13 MED ORDER — LORAZEPAM 2 MG/ML IJ SOLN: 1.0000 mg | Freq: Once | INTRAMUSCULAR | Status: DC

## 2023-12-13 MED ORDER — TETANUS-DIPHTH-ACELL PERTUSSIS 5-2.5-18.5 LF-MCG/0.5 IM SUSY
0.5000 mL | PREFILLED_SYRINGE | Freq: Once | INTRAMUSCULAR | Status: DC
  Filled 2023-12-13: qty 0.5

## 2023-12-13 MED ORDER — LIDOCAINE-EPINEPHRINE (PF) 2 %-1:200000 IJ SOLN
10.0000 mL | Freq: Once | INTRAMUSCULAR | Status: DC
  Filled 2023-12-13: qty 20

## 2023-12-13 MED ORDER — HALOPERIDOL LACTATE 5 MG/ML IJ SOLN
5.0000 mg | Freq: Once | INTRAMUSCULAR | Status: AC
Start: 2023-12-13 — End: 2023-12-13
  Administered 2023-12-13: 5 mg via INTRAMUSCULAR
  Filled 2023-12-13: qty 1

## 2023-12-13 MED ORDER — FENTANYL CITRATE PF 50 MCG/ML IJ SOSY
50.0000 ug | PREFILLED_SYRINGE | Freq: Once | INTRAMUSCULAR | Status: AC
  Administered 2023-12-14: 50 ug via INTRAVENOUS
  Filled 2023-12-13: qty 1

## 2023-12-13 NOTE — ED Notes (Signed)
 Pt refusing to keep any monitor cords on. Pt confused at baseline

## 2023-12-13 NOTE — ED Provider Notes (Signed)
 [place] [facility] AT [facility] Provider Note   CSN: [id number] Arrival date & time: 12/13/23  2047     [facility] presents with: Mart Colpitts is a 68 y.o. male.   [facility] with history of alcohol [facility], [facility], Tanner had an unwitnessed fall.  According to his wife Leita who I spoke with over the phone, the Tanner does not walk at baseline and is wheelchair-bound.  He is not anticoagulated.  Level 5 caveat -- dementia  The history is provided by the Tanner. No language interpreter was used.  Fall       Prior to Admission medications   Medication Sig Start Date End Date Taking? Authorizing Provider  acetaminophen  (TYLENOL ) 500 MG tablet Take 2 tablets (1,000 mg total) by mouth every 8 (eight) hours as needed. [facility] taking differently: Take 1,000 mg by mouth every 8 (eight) hours as needed for mild pain (pain score 1-3) or moderate pain (pain score 4-6). 05/16/23   Deward Eck, PA-C  busPIRone  (BUSPAR ) 10 MG tablet Take 10 mg by mouth 3 (three) times daily.    [provider]  chlorhexidine  (HIBICLENS ) 4 % external liquid Apply 15 mLs (1 Application total) topically as directed for 30 doses. Use as directed daily for 5 days every other week for 6 weeks. [facility] not taking: Reported on 07/22/2023 05/13/23   Deward Eck, PA-C  donepezil  (ARICEPT ) 10 MG tablet Take 10 mg by mouth at bedtime.    [provider]  enoxaparin  (LOVENOX ) 40 MG/0.4ML injection Inject 0.4 mLs (40 mg total) into the skin daily for 14 days. [facility] not taking: Reported on 07/22/2023 05/17/23 05/31/23  Deward Eck, PA-C  memantine  (NAMENDA ) 5 MG tablet Take 5 mg by mouth 2 (two) times daily. 04/03/23   [provider]  Multiple Vitamins-Minerals (CENTRUM SILVER 50+MEN) TABS Take 1 tablet by mouth daily.     [provider]  Nutritional Supplements (NUTRITIONAL SUPPLEMENT PO) Take 120 mLs by mouth 2 (two) times daily.    [provider]  Omega-3 Fatty Acids (FISH OIL ADULT GUMMIES PO) Take 1 capsule by mouth daily.    [provider]  QUEtiapine  (SEROQUEL ) 25 MG tablet Take 25 mg by mouth at bedtime.    [provider]  sertraline  (ZOLOFT ) 100 MG tablet Take 100 mg by mouth daily.    [provider]  Vitamin D , Ergocalciferol , (DRISDOL ) 1.25 MG (50000 UNIT) CAPS capsule Take 1 capsule (50,000 Units total) by mouth every 7 (seven) days. [facility]anner taking differently: Take 50,000 Units by mouth every 7 (seven) days. On Wednesdays. 10/20/22   Leotis Bogus, MD    Allergies: [facility]anner has no known allergies.    Review of Systems  Unable to perform ROS: Dementia    Updated Vital Signs BP 137/78   Pulse 85   Temp 98 F (36.7 C) (Oral)   Resp 16   SpO2 100%   Physical Exam Vitals and nursing note reviewed.  Constitutional:      General: He is not in acute distress.    Appearance: Normal appearance. He is normal weight. He is not ill-appearing, toxic-appearing or diaphoretic.     Comments: Chronically unwell appearing  HENT:     Head: Normocephalic and atraumatic.     Comments: 4 cm vertical gaping laceration noted to the left frontal scalp.  No crepitus or deformity Neck:     Comments: C-collar in place Cardiovascular:     Rate and Rhythm: Normal rate.  Pulmonary:     Effort: Pulmonary effort is normal. No respiratory distress.  Abdominal:     General: Abdomen is flat.     Palpations: Abdomen is soft.     Tenderness: There is no abdominal tenderness.  Musculoskeletal:        General: Normal range of motion.     Cervical back: Normal range of motion.  Skin:    General: Skin is warm and dry.  Neurological:     General: No focal deficit present.     Mental Status: He is alert.     Comments: Alert to self only, moving all extremities equally   Psychiatric:        Mood and Affect: Mood normal.        Behavior: Behavior normal.     (all labs ordered are listed, but only abnormal results are displayed) Labs Reviewed - No data to display  EKG: None  Radiology: DG Chest Portable 1 View Result Date: 12/13/2023 EXAM: 1 VIEW(S) XRAY OF THE CHEST 12/13/2023 09:44:49 PM COMPARISON: Comparison with 07/22/2023 CLINICAL HISTORY: fall FINDINGS: LUNGS AND PLEURA: No focal pulmonary opacity. No pulmonary edema. No pleural effusion. No pneumothorax. HEART AND MEDIASTINUM: No acute abnormality of the cardiac and mediastinal silhouettes. BONES AND SOFT TISSUES: No acute osseous abnormality. IMPRESSION: 1. No acute process. Electronically signed by: Norman Gatlin MD 12/13/2023 10:02 PM EDT RP Workstation: [id number]   DG Pelvis Portable Result Date: 12/13/2023 CLINICAL DATA:  68 year old post fall. EXAM: PORTABLE PELVIS 1-2 VIEWS COMPARISON:  07/22/2023 FINDINGS: The bones are subjectively under mineralized. Findings suggestive of right superior ramus fracture. Potential right inferior ramus fracture. Pubic symphysis and sacroiliac joints are congruent. Left hip hemiarthroplasty, intact were visualized. Right femoral intramedullary nail with trans trochanteric screw fixation. Chronic fragmentation superior to the right greater trochanter. IMPRESSION: 1. Findings suggestive of right superior ramus fracture. Potential right inferior ramus fracture. 2. Postsurgical change of both hips with intact hardware. Electronically Signed   By: Andrea Gasman M.D.   On: 12/13/2023 21:59     Procedures   Medications Ordered in the ED  lidocaine -EPINEPHrine  (XYLOCAINE  W/EPI) 2 %-1:200000 (PF) injection 10 mL (has no administration in time range)  Tdap (BOOSTRIX ) injection 0.5 mL (has no administration in time range)  LORazepam  (ATIVAN ) injection 1 mg (1 mg Intramuscular Not Given 12/13/23 2314)  LORazepam  (ATIVAN ) injection 1 mg (1 mg Intramuscular Given 12/13/23  2158)  haloperidol  lactate (HALDOL ) injection 2 mg (2 mg Intramuscular Given 12/13/23 2158)  lidocaine -EPINEPHrine -tetracaine  (LET) topical gel (3 mLs Topical Given 12/13/23 2200)  haloperidol  lactate (HALDOL ) injection 5 mg (5 mg Intramuscular Given 12/13/23 2314)    Clinical Course as of 12/13/23 2353  Tue Dec 13, 2023  2352 Fall. Wheelchair bound. CT is pending. Pelvic fracture. IF CT ok can go back to [facility] (SNF). [JR]    Clinical Course User Index [JR] Lang Norleen POUR, PA-C                                 Medical Decision Making Amount and/or Complexity of Data Reviewed Radiology: ordered.  Risk Prescription drug management.   This [facility] is a 68 y.o. male who presents to the ED for concern of fall, this involves an extensive number of treatment options, and is a complaint that  carries with it a high risk of complications and morbidity. The emergent differential diagnosis prior to evaluation includes, but is not limited to,  trauma . This is not an exhaustive differential.   Past Medical History / Co-morbidities / Social History:  has a past medical history of Alcohol [facility], Dementia (HCC), and Liver dysfunction.  Tanner with Tanner dementia, alert to self only  Additional history: Chart reviewed.  Physical Exam: Physical exam performed. The pertinent findings include: Tanner is chronically unwell appearing, 4 cm vertical gaping laceration noted to the left frontal scalp.  No crepitus or deformity   Imaging Studies: I ordered imaging studies including CXR, DG pelvis, CT head, cervical spine, pelvis. I independently visualized and interpreted imaging which showed  CXR: NAD  DG pelvis:  1. Findings suggestive of right superior ramus fracture. Potential right inferior ramus fracture. 2. Postsurgical change of both hips with intact hardware.  I agree with the radiologist interpretation.  [facility] CTs are pending at shift change.   Medications: I ordered  medication including Haldol , Ativan , fentanyl  for pain, Tanner is also combative. Reevaluation of the Tanner after these medicines showed that the Tanner improved. I have reviewed the patients home medicines and have made adjustments as needed.   Disposition:  [facility] CTs are pending at shift change, will determine dispo.  He is wheelchair-bound at baseline and could potentially go home with these pelvic fractures.  His laceration was repaired by my attending Dr. Ruthe, see their note for further information.  Care handoff to Norleen Essex, PA-C at shift change. Please see their note for further information.   This is a shared visit with supervising physician Dr. Ruthe who has independently evaluated Tanner & provided guidance in evaluation/management/disposition, in agreement with care   Final diagnoses:  Fall, initial encounter  Closed nondisplaced fracture of pelvis, unspecified part of pelvis, initial encounter [facility])    ED Discharge Orders     None          Nora Lauraine DELENA DEVONNA 12/14/23 0005    Ruthe Cornet, DO 12/14/23 1502

## 2023-12-13 NOTE — Discharge Instructions (Signed)
 Sutures will dissolve on their own.  Keep this area clean and dry for couple days.  Monitor for any signs of infection.

## 2023-12-13 NOTE — ED Triage Notes (Addendum)
 Pt had unwitnessed fall at facility with small lac above L eye. Pt is oriented x1 to self at baseline. Pt unable to tell any hx. Facility confirmed neg thinners. Pt agitated at times. Placed on c-collar with ems. Ems VS BP 139/70 HR90 TEMP 150f temporal CBG 130

## 2023-12-13 NOTE — ED Provider Notes (Signed)
 Shared PA visit.  Patient here after unwitnessed fall from facility.  Patient with history of severe dementia.  Got laceration over the left side of the forehead that was repaired with sutures.  He is very agitated at baseline.  Will need to give him Haldol  Ativan  maybe Valium to get him through for CT scans.  He already has x-ray of the chest and pelvis done that show may be superior and inferior pubic rami fracture on the right.  Otherwise chest x-ray is unremarkable.  His vital signs are unremarkable.  Patient tolerated suture repair well.  Plan is to get CT scan of his head and neck and pelvis.  Anticipate he can be discharged home if there is nothing operative or other acute process at this time.  Please see oncoming ED staff note for further results evaluation and disposition of the patient.  Tetanus updated.  This chart was dictated using voice recognition software.  Despite best efforts to proofread,  errors can occur which can change the documentation meaning.   .Laceration Repair  Date/Time: 12/13/2023 11:32 PM  Performed by: Ruthe Cornet, DO Authorized by: Ruthe Cornet, DO   Consent:    Consent obtained:  Emergent situation   Consent given by:  Guardian   Risks discussed:  Infection, need for additional repair, nerve damage, poor wound healing, poor cosmetic result, pain, retained foreign body, tendon damage and vascular damage   Alternatives discussed:  No treatment Universal protocol:    Procedure explained and questions answered to patient or proxy's satisfaction: yes     Relevant documents present and verified: yes     Patient identity confirmed:  Arm band Anesthesia:    Anesthesia method:  Topical application   Topical anesthetic:  LET Laceration details:    Location:  Face   Face location:  Forehead   Length (cm):  4   Depth (mm):  2 Exploration:    Limited defect created (wound extended): no     Imaging outcome: foreign body not noted     Wound exploration: wound  explored through full range of motion and entire depth of wound visualized     Wound extent: areolar tissue not violated, fascia not violated, no foreign body, no signs of injury, no nerve damage, no tendon damage, no underlying fracture and no vascular damage     Contaminated: no   Treatment:    Area cleansed with:  Shur-Clens   Amount of cleaning:  Extensive   Irrigation volume:  500 cc   Irrigation method:  Pressure wash Skin repair:    Repair method:  Sutures   Suture size:  5-0   Suture material:  Fast-absorbing gut   Number of sutures:  4 Approximation:    Approximation:  Close Repair type:    Repair type:  Simple Post-procedure details:    Dressing:  Non-adherent dressing   Procedure completion:  [place] Ruthe Cornet, DO 12/13/23 2334

## 2023-12-13 NOTE — ED Notes (Signed)
 Wife at bedside.

## 2023-12-13 NOTE — ED Provider Notes (Incomplete)
 [place] EMERGENCY DEPARTMENT AT [facility] Provider Note   CSN: [id number] Arrival date & time: 12/13/23  2047     Patient presents with: [facility] is a 68 y.o. male.  {Add pertinent medical, surgical, social history, OB history to YEP:67052} Patient with history of alcohol abuse, advanced dementia presents today from [facility] for fall.  Patient with advanced dementia alert to self only per baseline.  According to facility, patient had an unwitnessed fall.  According to his wife Tanner Bond who I spoke with over the phone, the patient does not walk at baseline and is wheelchair-bound.  He is not anticoagulated.  Level 5 caveat -- dementia  The history is provided by the patient. No language interpreter was used.  Fall       Prior to Admission medications   Medication Sig Start Date End Date Taking? Authorizing Provider  acetaminophen  (TYLENOL ) 500 MG tablet Take 2 tablets (1,000 mg total) by mouth every 8 (eight) hours as needed. Patient taking differently: Take 1,000 mg by mouth every 8 (eight) hours as needed for mild pain (pain score 1-3) or moderate pain (pain score 4-6). 05/16/23   Deward Eck, PA-C  busPIRone  (BUSPAR ) 10 MG tablet Take 10 mg by mouth 3 (three) times daily.    [provider]  chlorhexidine  (HIBICLENS ) 4 % external liquid Apply 15 mLs (1 Application total) topically as directed for 30 doses. Use as directed daily for 5 days every other week for 6 weeks. Patient not taking: Reported on 07/22/2023 05/13/23   Deward Eck, PA-C  donepezil  (ARICEPT ) 10 MG tablet Take 10 mg by mouth at bedtime.    [provider]  enoxaparin  (LOVENOX ) 40 MG/0.4ML injection Inject 0.4 mLs (40 mg total) into the skin daily for 14 days. Patient not taking: Reported on 07/22/2023 05/17/23 05/31/23  Deward Eck, PA-C  memantine  (NAMENDA ) 5 MG tablet Take 5 mg by mouth 2 (two) times daily. 04/03/23   [provider]  Multiple  Vitamins-Minerals (CENTRUM SILVER 50+MEN) TABS Take 1 tablet by mouth daily.    [provider]  Nutritional Supplements (NUTRITIONAL SUPPLEMENT PO) Take 120 mLs by mouth 2 (two) times daily.    [provider]  Omega-3 Fatty Acids (FISH OIL ADULT GUMMIES PO) Take 1 capsule by mouth daily.    [provider]  QUEtiapine  (SEROQUEL ) 25 MG tablet Take 25 mg by mouth at bedtime.    [provider]  sertraline  (ZOLOFT ) 100 MG tablet Take 100 mg by mouth daily.    [provider]  Vitamin D , Ergocalciferol , (DRISDOL ) 1.25 MG (50000 UNIT) CAPS capsule Take 1 capsule (50,000 Units total) by mouth every 7 (seven) days. Patient taking differently: Take 50,000 Units by mouth every 7 (seven) days. On Wednesdays. 10/20/22   Leotis Bogus, MD    Allergies: Patient has no known allergies.    Review of Systems  Unable to perform ROS: Dementia    Updated Vital Signs BP 137/78   Pulse 85   Temp 98 F (36.7 C) (Oral)   Resp 16   SpO2 100%   Physical Exam Vitals and nursing note reviewed.  Constitutional:      General: He is not in acute distress.    Appearance: Normal appearance. He is normal weight. He is not ill-appearing, toxic-appearing or diaphoretic.     Comments: Chronically unwell appearing  HENT:     Head: Normocephalic and atraumatic.     Comments: 4 cm  vertical gaping laceration noted to the left frontal scalp.  No crepitus or deformity Neck:     Comments: C-collar in place Cardiovascular:     Rate and Rhythm: Normal rate.  Pulmonary:     Effort: Pulmonary effort is normal. No respiratory distress.  Abdominal:     General: Abdomen is flat.     Palpations: Abdomen is soft.     Tenderness: There is no abdominal tenderness.  Musculoskeletal:        General: Normal range of motion.     Cervical back: Normal range of motion.  Skin:    General: Skin is warm and dry.  Neurological:     General: No focal deficit present.     Mental  Status: He is alert.     Comments: Alert to self only, moving all extremities equally  Psychiatric:        Mood and Affect: Mood normal.        Behavior: Behavior normal.     (all labs ordered are listed, but only abnormal results are displayed) Labs Reviewed - No data to display  EKG: None  Radiology: DG Chest Portable 1 View Result Date: 12/13/2023 EXAM: 1 VIEW(S) XRAY OF THE CHEST 12/13/2023 09:44:49 PM COMPARISON: Comparison with 07/22/2023 CLINICAL HISTORY: fall FINDINGS: LUNGS AND PLEURA: No focal pulmonary opacity. No pulmonary edema. No pleural effusion. No pneumothorax. HEART AND MEDIASTINUM: No acute abnormality of the cardiac and mediastinal silhouettes. BONES AND SOFT TISSUES: No acute osseous abnormality. IMPRESSION: 1. No acute process. Electronically signed by: Norman Gatlin MD 12/13/2023 10:02 PM EDT RP Workstation: [id number]   DG Pelvis Portable Result Date: 12/13/2023 CLINICAL DATA:  68 year old post fall. EXAM: PORTABLE PELVIS 1-2 VIEWS COMPARISON:  07/22/2023 FINDINGS: The bones are subjectively under mineralized. Findings suggestive of right superior ramus fracture. Potential right inferior ramus fracture. Pubic symphysis and sacroiliac joints are congruent. Left hip hemiarthroplasty, intact were visualized. Right femoral intramedullary nail with trans trochanteric screw fixation. Chronic fragmentation superior to the right greater trochanter. IMPRESSION: 1. Findings suggestive of right superior ramus fracture. Potential right inferior ramus fracture. 2. Postsurgical change of both hips with intact hardware. Electronically Signed   By: Andrea Gasman M.D.   On: 12/13/2023 21:59    {Document cardiac monitor, telemetry assessment procedure when appropriate:32947} Procedures   Medications Ordered in the ED  lidocaine -EPINEPHrine  (XYLOCAINE  W/EPI) 2 %-1:200000 (PF) injection 10 mL (has no administration in time range)  Tdap (BOOSTRIX ) injection 0.5 mL (has no  administration in time range)  LORazepam  (ATIVAN ) injection 1 mg (1 mg Intramuscular Not Given 12/13/23 2314)  LORazepam  (ATIVAN ) injection 1 mg (1 mg Intramuscular Given 12/13/23 2158)  haloperidol  lactate (HALDOL ) injection 2 mg (2 mg Intramuscular Given 12/13/23 2158)  lidocaine -EPINEPHrine -tetracaine  (LET) topical gel (3 mLs Topical Given 12/13/23 2200)  haloperidol  lactate (HALDOL ) injection 5 mg (5 mg Intramuscular Given 12/13/23 2314)    Clinical Course as of 12/13/23 2353  Tue Dec 13, 2023  2352 Fall. Wheelchair bound. CT is pending. Pelvic fracture. IF CT ok can go back to facility (SNF). [JR]    Clinical Course User Index [JR] Lang Norleen POUR, PA-C   {Click here for ABCD2, HEART and other calculators REFRESH Note before signing:1}                              Medical Decision Making Amount and/or Complexity of Data Reviewed Radiology: ordered.  Risk Prescription drug management.   This  patient is a 68 y.o. male who presents to the ED for concern of fall, this involves an extensive number of treatment options, and is a complaint that carries with it a high risk of complications and morbidity. The emergent differential diagnosis prior to evaluation includes, but is not limited to,  trauma . This is not an exhaustive differential.   Past Medical History / Co-morbidities / Social History:  has a past medical history of Alcohol abuse, Dementia (HCC), and Liver dysfunction.  Patient with advanced dementia, alert to self only  Additional history: Chart reviewed.  Physical Exam: Physical exam performed. The pertinent findings include: Patient is chronically unwell appearing, 4 cm vertical gaping laceration noted to the left frontal scalp.  No crepitus or deformity   Imaging Studies: I ordered imaging studies including CXR, DG pelvis, CT head, cervical spine, pelvis. I independently visualized and interpreted imaging which showed  CXR: NAD  DG pelvis:  1. Findings suggestive of  right superior ramus fracture. Potential right inferior ramus fracture. 2. Postsurgical change of both hips with intact hardware.  I agree with the radiologist interpretation.  Patient CTs are pending at shift change.   Medications: I ordered medication including Haldol , Ativan , fentanyl  for pain, patient is also combative. Reevaluation of the patient after these medicines showed that the patient improved. I have reviewed the patients home medicines and have made adjustments as needed.   Disposition:  Patient CTs are pending at shift change, will determine dispo.  He is wheelchair-bound at baseline and could potentially go home with these pelvic fractures.  His laceration was repaired by my attending Dr. Ruthe, see their note for further information.  Care handoff to Norleen Essex, PA-C at shift change. Please see their note for further information.   This is a shared visit with supervising physician Dr. Ruthe who has independently evaluated patient & provided guidance in evaluation/management/disposition, in agreement with care   Final diagnoses:  None    ED Discharge Orders     None

## 2023-12-14 ENCOUNTER — Inpatient Hospital Stay (HOSPITAL_COMMUNITY)

## 2023-12-14 DIAGNOSIS — Z79899 Other long term (current) drug therapy: Secondary | ICD-10-CM | POA: Diagnosis not present

## 2023-12-14 DIAGNOSIS — F17290 Nicotine dependence, other tobacco product, uncomplicated: Secondary | ICD-10-CM | POA: Diagnosis present

## 2023-12-14 DIAGNOSIS — F0390 Unspecified dementia without behavioral disturbance: Secondary | ICD-10-CM | POA: Diagnosis present

## 2023-12-14 DIAGNOSIS — S32511A Fracture of superior rim of right pubis, initial encounter for closed fracture: Secondary | ICD-10-CM | POA: Diagnosis present

## 2023-12-14 DIAGNOSIS — Z96642 Presence of left artificial hip joint: Secondary | ICD-10-CM | POA: Diagnosis present

## 2023-12-14 DIAGNOSIS — Z993 Dependence on wheelchair: Secondary | ICD-10-CM | POA: Diagnosis not present

## 2023-12-14 DIAGNOSIS — Z681 Body mass index (BMI) 19 or less, adult: Secondary | ICD-10-CM | POA: Diagnosis not present

## 2023-12-14 DIAGNOSIS — Z515 Encounter for palliative care: Secondary | ICD-10-CM | POA: Diagnosis not present

## 2023-12-14 DIAGNOSIS — Z9181 History of falling: Secondary | ICD-10-CM | POA: Diagnosis not present

## 2023-12-14 DIAGNOSIS — Z7189 Other specified counseling: Secondary | ICD-10-CM | POA: Diagnosis not present

## 2023-12-14 DIAGNOSIS — F1020 Alcohol dependence, uncomplicated: Secondary | ICD-10-CM | POA: Diagnosis present

## 2023-12-14 DIAGNOSIS — S0181XA Laceration without foreign body of other part of head, initial encounter: Secondary | ICD-10-CM | POA: Diagnosis present

## 2023-12-14 DIAGNOSIS — G300 Alzheimer's disease with early onset: Secondary | ICD-10-CM | POA: Diagnosis not present

## 2023-12-14 DIAGNOSIS — S329XXA Fracture of unspecified parts of lumbosacral spine and pelvis, initial encounter for closed fracture: Secondary | ICD-10-CM | POA: Diagnosis not present

## 2023-12-14 DIAGNOSIS — S01112A Laceration without foreign body of left eyelid and periocular area, initial encounter: Secondary | ICD-10-CM | POA: Diagnosis present

## 2023-12-14 DIAGNOSIS — Z8042 Family history of malignant neoplasm of prostate: Secondary | ICD-10-CM | POA: Diagnosis not present

## 2023-12-14 DIAGNOSIS — E43 Unspecified severe protein-calorie malnutrition: Secondary | ICD-10-CM | POA: Diagnosis present

## 2023-12-14 DIAGNOSIS — W19XXXA Unspecified fall, initial encounter: Secondary | ICD-10-CM | POA: Diagnosis present

## 2023-12-14 DIAGNOSIS — S32591A Other specified fracture of right pubis, initial encounter for closed fracture: Secondary | ICD-10-CM | POA: Diagnosis present

## 2023-12-14 DIAGNOSIS — G9340 Encephalopathy, unspecified: Secondary | ICD-10-CM | POA: Diagnosis present

## 2023-12-14 DIAGNOSIS — S06309A Unspecified focal traumatic brain injury with loss of consciousness of unspecified duration, initial encounter: Secondary | ICD-10-CM | POA: Diagnosis present

## 2023-12-14 DIAGNOSIS — I615 Nontraumatic intracerebral hemorrhage, intraventricular: Secondary | ICD-10-CM | POA: Diagnosis not present

## 2023-12-14 DIAGNOSIS — R64 Cachexia: Secondary | ICD-10-CM | POA: Diagnosis present

## 2023-12-14 DIAGNOSIS — S0635AA Traumatic hemorrhage of left cerebrum with loss of consciousness status unknown, initial encounter: Secondary | ICD-10-CM | POA: Diagnosis present

## 2023-12-14 DIAGNOSIS — N400 Enlarged prostate without lower urinary tract symptoms: Secondary | ICD-10-CM | POA: Diagnosis present

## 2023-12-14 DIAGNOSIS — R296 Repeated falls: Secondary | ICD-10-CM | POA: Diagnosis present

## 2023-12-14 DIAGNOSIS — Y92129 Unspecified place in nursing home as the place of occurrence of the external cause: Secondary | ICD-10-CM | POA: Diagnosis not present

## 2023-12-14 DIAGNOSIS — R402413 Glasgow coma scale score 13-15, at hospital admission: Secondary | ICD-10-CM | POA: Diagnosis present

## 2023-12-14 DIAGNOSIS — Z66 Do not resuscitate: Secondary | ICD-10-CM | POA: Diagnosis present

## 2023-12-14 LAB — BASIC METABOLIC PANEL WITH GFR
Anion gap: 9 (ref 5–15)
Anion gap: 9 (ref 5–15)
BUN: 17 mg/dL (ref 8–23)
BUN: 18 mg/dL (ref 8–23)
CO2: 23 mmol/L (ref 22–32)
CO2: 25 mmol/L (ref 22–32)
Calcium: 8.7 mg/dL — ABNORMAL LOW (ref 8.9–10.3)
Calcium: 9.2 mg/dL (ref 8.9–10.3)
Chloride: 105 mmol/L (ref 98–111)
Chloride: 107 mmol/L (ref 98–111)
Creatinine, Ser: 0.84 mg/dL (ref 0.61–1.24)
Creatinine, Ser: 0.9 mg/dL (ref 0.61–1.24)
GFR, Estimated: >60 mL/min (ref >60)
GFR, Estimated: >60 mL/min (ref >60)
Glucose, Bld: 129 mg/dL — ABNORMAL HIGH (ref 70–99)
Glucose, Bld: 138 mg/dL — ABNORMAL HIGH (ref 70–99)
Potassium: 3.5 mmol/L (ref 3.5–5.1)
Potassium: 3.9 mmol/L (ref 3.5–5.1)
Sodium: 139 mmol/L (ref 135–145)
Sodium: 139 mmol/L (ref 135–145)

## 2023-12-14 LAB — CBC
HCT: 36.3 % — ABNORMAL LOW (ref 39.0–52.0)
HCT: 38.8 % — ABNORMAL LOW (ref 39.0–52.0)
Hemoglobin: 12.6 g/dL — ABNORMAL LOW (ref 13.0–17.0)
Hemoglobin: 13.5 g/dL (ref 13.0–17.0)
MCH: 33.2 pg (ref 26.0–34.0)
MCH: 33.8 pg (ref 26.0–34.0)
MCHC: 34.7 g/dL (ref 30.0–36.0)
MCHC: 34.8 g/dL (ref 30.0–36.0)
MCV: 95.8 fL (ref 80.0–100.0)
MCV: 97.2 fL (ref 80.0–100.0)
Platelets: 201 K/uL (ref 150–400)
Platelets: 216 K/uL (ref 150–400)
RBC: 3.79 MIL/uL — ABNORMAL LOW (ref 4.22–5.81)
RBC: 3.99 MIL/uL — ABNORMAL LOW (ref 4.22–5.81)
RDW: 12.5 % (ref 11.5–15.5)
RDW: 12.7 % (ref 11.5–15.5)
WBC: 10.8 K/uL — ABNORMAL HIGH (ref 4.0–10.5)
WBC: 15 K/uL — ABNORMAL HIGH (ref 4.0–10.5)
nRBC: 0 % (ref 0.0–0.2)
nRBC: 0 % (ref 0.0–0.2)

## 2023-12-14 LAB — PROTIME-INR
INR: 1.2 (ref 0.8–1.2)
Prothrombin Time: 16 s — ABNORMAL HIGH (ref 11.4–15.2)

## 2023-12-14 LAB — MAGNESIUM: Magnesium: 1.9 mg/dL (ref 1.7–2.4)

## 2023-12-14 LAB — GLUCOSE, CAPILLARY
Glucose-Capillary: 139 mg/dL — ABNORMAL HIGH (ref 70–99)
Glucose-Capillary: 145 mg/dL — ABNORMAL HIGH (ref 70–99)
Glucose-Capillary: 132 mg/dL — ABNORMAL HIGH (ref 70–99)

## 2023-12-14 LAB — MRSA NEXT GEN BY PCR, NASAL: MRSA by PCR Next Gen: NOT DETECTED

## 2023-12-14 LAB — APTT: aPTT: 29 s (ref 24–36)

## 2023-12-14 LAB — PHOSPHORUS: Phosphorus: 2.7 mg/dL (ref 2.5–4.6)

## 2023-12-14 MED ORDER — MORPHINE SULFATE (PF) 2 MG/ML IV SOLN
2.0000 mg | INTRAVENOUS | Status: DC | PRN
  Administered 2023-12-14 (×5): 2 mg via INTRAVENOUS
  Filled 2023-12-14 (×5): qty 1

## 2023-12-14 MED ORDER — METHOCARBAMOL 1000 MG/10ML IJ SOLN
500.0000 mg | Freq: Three times a day (TID) | INTRAMUSCULAR | Status: DC
  Administered 2023-12-14 (×2): 500 mg via INTRAVENOUS
  Filled 2023-12-14: qty 10

## 2023-12-14 MED ORDER — METOPROLOL TARTRATE 5 MG/5ML IV SOLN: 5.0000 mg | Freq: Four times a day (QID) | INTRAVENOUS | Status: DC | PRN

## 2023-12-14 MED ORDER — BUSPIRONE HCL 10 MG PO TABS
10.0000 mg | ORAL_TABLET | Freq: Three times a day (TID) | ORAL | Status: DC
  Administered 2023-12-14 – 2023-12-15 (×3): 10 mg
  Filled 2023-12-14 (×5): qty 1

## 2023-12-14 MED ORDER — DONEPEZIL HCL 10 MG PO TABS
10.0000 mg | ORAL_TABLET | Freq: Every day | ORAL | Status: DC
  Administered 2023-12-14: 10 mg
  Filled 2023-12-14: qty 1

## 2023-12-14 MED ORDER — LORAZEPAM 0.5 MG PO TABS: 0.5000 mg | ORAL_TABLET | Freq: Two times a day (BID) | ORAL | Status: DC | PRN

## 2023-12-14 MED ORDER — TRAMADOL HCL 50 MG PO TABS: 25.0000 mg | ORAL_TABLET | Freq: Four times a day (QID) | ORAL | Status: DC | PRN

## 2023-12-14 MED ORDER — BUSPIRONE HCL 10 MG PO TABS
10.0000 mg | ORAL_TABLET | Freq: Three times a day (TID) | ORAL | Status: DC
  Filled 2023-12-14 (×3): qty 1

## 2023-12-14 MED ORDER — ORAL CARE MOUTH RINSE: 15.0000 mL | OROMUCOSAL | Status: DC | PRN

## 2023-12-14 MED ORDER — METHOCARBAMOL 500 MG PO TABS
500.0000 mg | ORAL_TABLET | Freq: Three times a day (TID) | ORAL | Status: DC
  Administered 2023-12-14 – 2023-12-15 (×3): 500 mg
  Filled 2023-12-14 (×3): qty 1

## 2023-12-14 MED ORDER — METHOCARBAMOL 1000 MG/10ML IJ SOLN
500.0000 mg | Freq: Three times a day (TID) | INTRAMUSCULAR | Status: DC
  Administered 2023-12-15: 500 mg via INTRAVENOUS
  Filled 2023-12-14 (×2): qty 10

## 2023-12-14 MED ORDER — POLYETHYLENE GLYCOL 3350 17 G PO PACK: 17.0000 g | PACK | Freq: Every day | ORAL | Status: DC | PRN

## 2023-12-14 MED ORDER — QUETIAPINE FUMARATE 50 MG PO TABS
25.0000 mg | ORAL_TABLET | Freq: Every day | ORAL | Status: DC
  Administered 2023-12-14: 25 mg
  Filled 2023-12-14: qty 1

## 2023-12-14 MED ORDER — DOCUSATE SODIUM 50 MG/5ML PO LIQD
100.0000 mg | Freq: Two times a day (BID) | ORAL | Status: DC
  Administered 2023-12-14 – 2023-12-15 (×3): 100 mg
  Filled 2023-12-14 (×3): qty 10

## 2023-12-14 MED ORDER — QUETIAPINE FUMARATE 25 MG PO TABS: 25.0000 mg | ORAL_TABLET | Freq: Every day | ORAL | Status: DC

## 2023-12-14 MED ORDER — OSMOLITE 1.5 CAL PO LIQD
1000.0000 mL | ORAL | Status: DC
  Administered 2023-12-14: 1000 mL

## 2023-12-14 MED ORDER — THIAMINE MONONITRATE 100 MG PO TABS
100.0000 mg | ORAL_TABLET | Freq: Every day | ORAL | Status: DC
  Administered 2023-12-14 – 2023-12-15 (×2): 100 mg
  Filled 2023-12-14 (×2): qty 1

## 2023-12-14 MED ORDER — ACETAMINOPHEN 500 MG PO TABS: 1000.0000 mg | ORAL_TABLET | Freq: Four times a day (QID) | ORAL | Status: DC

## 2023-12-14 MED ORDER — ONDANSETRON 4 MG PO TBDP: 4.0000 mg | ORAL_TABLET | Freq: Four times a day (QID) | ORAL | Status: DC | PRN

## 2023-12-14 MED ORDER — ONDANSETRON HCL 4 MG/2ML IJ SOLN: 4.0000 mg | Freq: Four times a day (QID) | INTRAMUSCULAR | Status: DC | PRN

## 2023-12-14 MED ORDER — MEMANTINE HCL 5 MG PO TABS
5.0000 mg | ORAL_TABLET | Freq: Two times a day (BID) | ORAL | Status: DC
  Filled 2023-12-14 (×2): qty 1

## 2023-12-14 MED ORDER — DONEPEZIL HCL 10 MG PO TABS: 10.0000 mg | ORAL_TABLET | Freq: Every day | ORAL | Status: DC

## 2023-12-14 MED ORDER — SERTRALINE HCL 50 MG PO TABS: 100.0000 mg | ORAL_TABLET | Freq: Every day | ORAL | Status: DC

## 2023-12-14 MED ORDER — DOCUSATE SODIUM 100 MG PO CAPS: 100.0000 mg | ORAL_CAPSULE | Freq: Two times a day (BID) | ORAL | Status: DC

## 2023-12-14 MED ORDER — HYDRALAZINE HCL 20 MG/ML IJ SOLN: 10.0000 mg | INTRAMUSCULAR | Status: DC | PRN

## 2023-12-14 MED ORDER — ACETAMINOPHEN 500 MG PO TABS
1000.0000 mg | ORAL_TABLET | Freq: Four times a day (QID) | ORAL | Status: DC
  Administered 2023-12-14 – 2023-12-15 (×4): 1000 mg
  Filled 2023-12-14 (×4): qty 2

## 2023-12-14 MED ORDER — MEMANTINE HCL 5 MG PO TABS
5.0000 mg | ORAL_TABLET | Freq: Two times a day (BID) | ORAL | Status: DC
  Administered 2023-12-14 – 2023-12-15 (×2): 5 mg
  Filled 2023-12-14 (×3): qty 1

## 2023-12-14 MED ORDER — OSMOLITE 1.2 CAL PO LIQD: 1000.0000 mL | ORAL | Status: DC

## 2023-12-14 MED ORDER — METHOCARBAMOL 500 MG PO TABS: 500.0000 mg | ORAL_TABLET | Freq: Three times a day (TID) | ORAL | Status: DC

## 2023-12-14 MED ORDER — SERTRALINE HCL 100 MG PO TABS
100.0000 mg | ORAL_TABLET | Freq: Every day | ORAL | Status: DC
  Administered 2023-12-15: 100 mg
  Filled 2023-12-14: qty 1

## 2023-12-14 NOTE — Progress Notes (Signed)
 Pt transferred to 4NP-05 from 4N ICU. Repsonds to voice. Oriented to self. VS and assessment documented. Unable to orient pt to unit. Bed in lowest position, floor mats placed, bed alarm on.

## 2023-12-14 NOTE — H&P (Signed)
 Admitting Physician: Tanner Bond  Service: Trauma Surgery  CC: Fall  Subjective   Mechanism of Injury: Tanner Bond is an 68 y.o. male who presented as a level 2 trauma after a unwitnessed fall at SNF.  Patient has dementia and unable to provide history  Past Medical History:  Diagnosis Date   Alcohol abuse    Dementia (HCC)    Liver dysfunction     Past Surgical History:  Procedure Laterality Date   APPENDECTOMY     as child   HIP ARTHROPLASTY Left 05/13/2023   Procedure: ARTHROPLASTY BIPOLAR HIP (HEMIARTHROPLASTY);  Surgeon: Tanner Sharper, MD;  Location: [facility] OR;  Service: Orthopedics;  Laterality: Left;   INTRAMEDULLARY (IM) NAIL INTERTROCHANTERIC Right 10/12/2022   Procedure: INTRAMEDULLARY (IM) NAIL INTERTROCHANTERIC;  Surgeon: Tanner Rush, MD;  Location: [facility] OR;  Service: Orthopedics;  Laterality: Right;   URETHRAL DILATION     as child    Family History  Problem Relation Age of Onset   Dementia Mother    Prostate cancer Father     Social:  reports that he has been smoking cigars. He has never used smokeless tobacco. He reports current alcohol use. He reports that he does not use drugs.  Allergies: No Known Allergies  Medications: Current Outpatient Medications  Medication Instructions   acetaminophen  (TYLENOL ) 1,000 mg, Oral, Every 8 hours PRN   busPIRone  (BUSPAR ) 10 mg, 3 times daily   chlorhexidine  (HIBICLENS ) 4 % external liquid 1 Application, Topical, As directed, Use as directed daily for 5 days every other week for 6 weeks.   donepezil  (ARICEPT ) 10 mg, Daily at bedtime   enoxaparin  (LOVENOX ) 40 mg, Subcutaneous, Every 24 hours   memantine  (NAMENDA ) 5 mg, 2 times daily   Multiple Vitamins-Minerals (CENTRUM SILVER 50+MEN) TABS 1 tablet, Daily   Nutritional Supplements (NUTRITIONAL SUPPLEMENT PO) 120 mLs, 2 times daily   Omega-3 Fatty Acids (FISH OIL ADULT GUMMIES PO) 1 capsule, Daily   QUEtiapine  (SEROQUEL ) 25 mg, Daily at bedtime   sertraline   (ZOLOFT ) 100 mg, Daily   Vitamin D  (Ergocalciferol ) (DRISDOL ) 50,000 Units, Oral, Every 7 days    Objective   Primary Survey: Blood pressure 137/78, pulse 85, temperature 98 F (36.7 C), temperature source Oral, resp. rate 16, SpO2 100%. Airway: Patent, protecting airway Breathing: Bilateral breath sounds, breathing spontaneously Circulation: Stable, Palpable peripheral pulses Disability: Moving all extremities,   GCS Eyes: 4 - Eyes open spontaneously  GCS Verbal: 5 - Oriented  GCS Motor: 6 - Obeys commands for movement  GCS 15 Environment/Exposure: Warm, dry  Primary Survey Adjuncts:  CXR - See results below PXR - See results below  Secondary Survey: Head: Left eye periorbital hematoma and laceration repair Neck: Full range of motion without pain, no midline tenderness Chest: Bilateral breath sounds, chest wall stable Abdomen: Soft, non-tender, non-distended Upper Extremities: Strength and sensation intact, palpable peripheral pulses Lower extremities: Strength and sensation intact, palpable peripheral pulses Back: No step offs or deformities, atraumatic Rectal: Deferred Psych: baseline   Results for orders placed or performed during the hospital encounter of 12/13/23 (from the past 24 hours)  CBC     Status: Abnormal   Collection Time: 12/14/23 12:15 AM  Result Value Ref Range   WBC 15.0 (H) 4.0 - 10.5 K/uL   RBC 3.99 (L) 4.22 - 5.81 MIL/uL   Hemoglobin 13.5 13.0 - 17.0 g/dL   HCT 61.1 (L) 60.9 - 47.9 %   MCV 97.2 80.0 - 100.0 fL   MCH 33.8  26.0 - 34.0 pg   MCHC 34.8 30.0 - 36.0 g/dL   RDW 87.4 88.4 - 84.4 %   Platelets 216 150 - 400 K/uL   nRBC 0.0 0.0 - 0.2 %  Basic metabolic panel     Status: Abnormal   Collection Time: 12/14/23 12:15 AM  Result Value Ref Range   Sodium 139 135 - 145 mmol/L   Potassium 3.9 3.5 - 5.1 mmol/L   Chloride 105 98 - 111 mmol/L   CO2 25 22 - 32 mmol/L   Glucose, Bld 138 (H) 70 - 99 mg/dL   BUN 18 8 - 23 mg/dL   Creatinine, Ser  9.09 0.61 - 1.24 mg/dL   Calcium 9.2 8.9 - 89.6 mg/dL   GFR, Estimated >39 >39 mL/min   Anion gap 9 5 - 15     Imaging Orders         CT Head Wo Contrast         CT Cervical Spine Wo Contrast         DG Chest Portable 1 View         DG Pelvis Portable         CT PELVIS WO CONTRAST         CT Head Wo Contrast      Assessment and Plan   Tanner Bond is an 68 y.o. male who presented as a level 2 trauma after a fall.  Injuries: Left eye periorbital hematoma and laceration - repaired by ER Provider Pubic rami fractures - Dr. Kendal consulted by ER provider, will evaluate in AM Intraventricular hemorrhage - Neurosurgery consulted by ER provider, 5 AM repeat CT, SBP < 140    Dispo - ICU due to head bleed and dementia, will be difficult to monitor    Tanner JINNY Foy, MD  [facility] Use [email or web address] to contact on call provider  New Patient Billing: [phone] - High MDM

## 2023-12-14 NOTE — Progress Notes (Signed)
 Spoke with Dr. Lyndel to clarify code status for patient. He stated it is the intention for patient to be DNR with pre-arrest interventions, not DNR comfort. Verbal order to change code status to reflect this.

## 2023-12-14 NOTE — Progress Notes (Signed)
 Trauma/Critical Care Follow Up Note  Subjective:    Overnight Issues: CT this morning stable. Lethargic at the time of exam.   Objective:  Vital signs for last 24 hours: Temp:  [98 F (36.7 C)-102.7 F (39.3 C)] 99.2 F (37.3 C) (08/06 0500) Pulse Rate:  [85-115] 100 (08/06 0600) Resp:  [16-20] 19 (08/06 0600) BP: (108-137)/(66-79) 112/70 (08/06 0600) SpO2:  [92 %-100 %] 99 % (08/06 0600)  Hemodynamic parameters for last 24 hours:    Intake/Output from previous day: No intake/output data recorded.  Intake/Output this shift: No intake/output data recorded.  Vent settings for last 24 hours:    Physical Exam:  Gen: comfortable, no distress Neuro: lethargic HEENT: PERRL Neck: supple CV: RRR Pulm: unlabored breathing on room air Abd: soft, ND, NT GU: urine is clear, voids spontaneously Extr: wwp, no edema  Results for orders placed or performed during the hospital encounter of 12/13/23 (from the past 24 hours)  CBC     Status: Abnormal   Collection Time: 12/14/23 12:15 AM  Result Value Ref Range   WBC 15.0 (H) 4.0 - 10.5 K/uL   RBC 3.99 (L) 4.22 - 5.81 MIL/uL   Hemoglobin 13.5 13.0 - 17.0 g/dL   HCT 61.1 (L) 60.9 - 47.9 %   MCV 97.2 80.0 - 100.0 fL   MCH 33.8 26.0 - 34.0 pg   MCHC 34.8 30.0 - 36.0 g/dL   RDW 87.4 88.4 - 84.4 %   Platelets 216 150 - 400 K/uL   nRBC 0.0 0.0 - 0.2 %  Basic metabolic panel     Status: Abnormal   Collection Time: 12/14/23 12:15 AM  Result Value Ref Range   Sodium 139 135 - 145 mmol/L   Potassium 3.9 3.5 - 5.1 mmol/L   Chloride 105 98 - 111 mmol/L   CO2 25 22 - 32 mmol/L   Glucose, Bld 138 (H) 70 - 99 mg/dL   BUN 18 8 - 23 mg/dL   Creatinine, Ser 9.09 0.61 - 1.24 mg/dL   Calcium 9.2 8.9 - 89.6 mg/dL   GFR, Estimated >39 >39 mL/min   Anion gap 9 5 - 15  APTT     Status: None   Collection Time: 12/14/23  1:28 AM  Result Value Ref Range   aPTT 29 24 - 36 seconds  Protime-INR     Status: Abnormal   Collection Time: 12/14/23   1:28 AM  Result Value Ref Range   Prothrombin Time 16.0 (H) 11.4 - 15.2 seconds   INR 1.2 0.8 - 1.2  MRSA Next Gen by PCR, Nasal     Status: None   Collection Time: 12/14/23  4:44 AM   Specimen: Nasal Mucosa; Nasal Swab  Result Value Ref Range   MRSA by PCR Next Gen NOT DETECTED NOT DETECTED  CBC     Status: Abnormal   Collection Time: 12/14/23  6:33 AM  Result Value Ref Range   WBC 10.8 (H) 4.0 - 10.5 K/uL   RBC 3.79 (L) 4.22 - 5.81 MIL/uL   Hemoglobin 12.6 (L) 13.0 - 17.0 g/dL   HCT 63.6 (L) 60.9 - 47.9 %   MCV 95.8 80.0 - 100.0 fL   MCH 33.2 26.0 - 34.0 pg   MCHC 34.7 30.0 - 36.0 g/dL   RDW 87.2 88.4 - 84.4 %   Platelets 201 150 - 400 K/uL   nRBC 0.0 0.0 - 0.2 %  Basic metabolic panel     Status: Abnormal  Collection Time: 12/14/23  6:33 AM  Result Value Ref Range   Sodium 139 135 - 145 mmol/L   Potassium 3.5 3.5 - 5.1 mmol/L   Chloride 107 98 - 111 mmol/L   CO2 23 22 - 32 mmol/L   Glucose, Bld 129 (H) 70 - 99 mg/dL   BUN 17 8 - 23 mg/dL   Creatinine, Ser 9.15 0.61 - 1.24 mg/dL   Calcium 8.7 (L) 8.9 - 10.3 mg/dL   GFR, Estimated >39 >39 mL/min   Anion gap 9 5 - 15    Assessment & Plan: The plan of care was discussed with the bedside nurse who is in agreement with this plan and no additional concerns were raised.   Present on Admission:  Bleeding in head following injury with loss of consciousness (HCC)    LOS: 0 days   IVH - Dr. Darnella following, CT this morning stable, goal SBP <160, CT TL spine ordered, Tentative plan for DVT ppx 8/9 Pubic Rami Fx - Dr. Kendal consulted, will follow up recs  Dementia - frequent orientation, protective measures Alcoholism - monitor for withdrawal Ativan  use (undetermined indication) - will order PRN to mitigate withdrawal  FEN - SLP consult, will consider diet vs TF pending recs DVT - SCDs, hold chemical ppx due to bleeding concerns tentative plan to start 8/9 Dispo - Transfer to progressive,   Critical Care Total Time:  32 minutes  Cordella DELENA [facility] Please use [email or web address] to contact on call provider  12/14/2023  *Care during the described time interval was provided by me. I have reviewed this patient's available data, including medical history, events of note, physical examination and test results as part of my evaluation.

## 2023-12-14 NOTE — TOC CAGE-AID Note (Signed)
 Transition of Care [facility]) - CAGE-AID Screening   Patient Details  Name: Orange Hilligoss MRN: [id number] Date of Birth: [birth year]  [facility] CM/SW Contact:    Inocente GORMAN Kindle, LCSW Phone Number: 12/14/2023, 10:15 AM   Clinical Narrative: Patient disoriented and unable to participate in screening.    CAGE-AID Screening: Substance Abuse Screening unable to be completed due to: : Patient unable to participate

## 2023-12-14 NOTE — ED Notes (Signed)
 RN Dorn notified of current patient status changes. RN informed this RN to continue to send patient up to ICU.

## 2023-12-14 NOTE — Consult Note (Signed)
 Neurosurgery Consult Note  Assessment:  68 y/o M w/ hx alcoholism, dementia who presents after GLF, found to have small scattered IVH. Stable on repeat imaging  Plan:  SBP<160 CT-TL routine ordered to rule out other spine fx as pt cannot convey symptoms. Can have AAT in meantime DAT Q4 neuro checks Ok for DVT chemoppx on 8/9   CC: fall  HPI:     Patient is a 68 y.o. male w/ hx dementia, alcoholism who presents after GLF. CTH shows scattered tramautic IVH. Stable on repeat imaging. Pt is currently encephalopathic in ICU   Patient Active Problem List   Diagnosis Date Noted   Bleeding in head following injury with loss of consciousness (HCC) 12/14/2023   Closed displaced fracture of neck of left femur (HCC) 05/12/2023   Closed right hip fracture (HCC) 10/11/2022   Intraventricular hemorrhage (HCC) 10/11/2022   Dementia with behavioral disturbance (HCC) 10/11/2022   Hypokalemia 10/11/2022   Tobacco use disorder 10/11/2022   Acne 09/26/2017   BPH associated with nocturia 07/06/2017   Insomnia 07/06/2017   Alcoholic liver disease (HCC) 07/05/2017   Hypertension 04/13/2017   Alcoholism in remission (HCC) 04/12/2017   History of adenomatous polyp of colon 04/12/2017   Past Medical History:  Diagnosis Date   Alcohol abuse    Dementia (HCC)    Liver dysfunction     Past Surgical History:  Procedure Laterality Date   APPENDECTOMY     as child   HIP ARTHROPLASTY Left 05/13/2023   Procedure: ARTHROPLASTY BIPOLAR HIP (HEMIARTHROPLASTY);  Surgeon: Celena Sharper, MD;  Location: [facility] OR;  Service: Orthopedics;  Laterality: Left;   INTRAMEDULLARY (IM) NAIL INTERTROCHANTERIC Right 10/12/2022   Procedure: INTRAMEDULLARY (IM) NAIL INTERTROCHANTERIC;  Surgeon: Yvone Rush, MD;  Location: [facility] OR;  Service: Orthopedics;  Laterality: Right;   URETHRAL DILATION     as child    Medications Prior to Admission  Medication Sig Dispense Refill Last Dose/Taking   acetaminophen  (TYLENOL ) 500 MG  tablet Take 2 tablets (1,000 mg total) by mouth every 8 (eight) hours as needed. (Patient taking differently: Take 1,000 mg by mouth every 8 (eight) hours as needed for mild pain (pain score 1-3) or moderate pain (pain score 4-6).)   Past Month   busPIRone  (BUSPAR ) 10 MG tablet Take 10 mg by mouth 3 (three) times daily.   12/13/2023 Noon   donepezil  (ARICEPT ) 10 MG tablet Take 10 mg by mouth at bedtime.   12/12/2023   LORazepam  (ATIVAN ) 0.5 MG tablet Take 0.5 mg by mouth 2 (two) times daily.   12/13/2023 Evening   memantine  (NAMENDA ) 5 MG tablet Take 5 mg by mouth 2 (two) times daily.   12/13/2023 Morning   Multiple Vitamins-Minerals (CENTRUM SILVER 50+MEN) TABS Take 1 tablet by mouth daily.   12/13/2023 Morning   Nutritional Supplements (NUTRITIONAL SUPPLEMENT PO) Take 120 mLs by mouth with breakfast, with lunch, and with evening meal.   12/13/2023 Evening   Omega-3 Fatty Acids (FISH OIL ADULT GUMMIES PO) Take 1 capsule by mouth daily.   12/13/2023 Morning   QUEtiapine  (SEROQUEL ) 50 MG tablet Take 50 mg by mouth 2 (two) times daily.   12/13/2023 Morning   sertraline  (ZOLOFT ) 100 MG tablet Take 100 mg by mouth daily.   12/12/2023   Vitamin D , Ergocalciferol , (DRISDOL ) 1.25 MG (50000 UNIT) CAPS capsule Take 1 capsule (50,000 Units total) by mouth every 7 (seven) days. (Patient taking differently: Take 50,000 Units by mouth every 7 (seven) days. On Wednesdays.) 5 capsule  0 12/07/2023   No Known Allergies  Social History   Tobacco Use   Smoking status: Some Days    Types: Cigars   Smokeless tobacco: Never  Substance Use Topics   Alcohol use: Yes    Comment: As per spouse report, drinks occasionally.  Last BM 3 weekends ago    Family History  Problem Relation Age of Onset   Dementia Mother    Prostate cancer Father      Review of Systems Pertinent items are noted in HPI.   Objective:   Patient Vitals for the past 8 hrs:  BP Temp Temp src Pulse Resp SpO2  12/14/23 0600 112/70 -- -- 100 19 99 %   12/14/23 0500 119/79 99.2 F (37.3 C) Axillary (!) 106 17 92 %  12/14/23 0400 126/77 -- -- (!) 104 17 93 %  12/14/23 0300 118/75 -- -- (!) 108 19 93 %  12/14/23 0237 124/72 99.3 F (37.4 C) Axillary (!) 115 20 94 %  12/14/23 0200 108/69 -- -- (!) 115 20 92 %  12/14/23 0138 -- (!) 102.7 F (39.3 C) Rectal -- -- --  12/14/23 0138 123/66 -- -- (!) 113 16 92 %   No intake/output data recorded. No intake/output data recorded.    Exam: GCS 3E 3V 51M Oriented to name. Otherwise disoriented PERRL, conjugate gaze In restraints. Squeezes hands and wiggles toes b/l, symmetrically   Data ReviewCBC:  Lab Results  Component Value Date   WBC 10.8 (H) 12/14/2023   RBC 3.79 (L) 12/14/2023   BMP:  Lab Results  Component Value Date   GLUCOSE 129 (H) 12/14/2023   CO2 23 12/14/2023   BUN 17 12/14/2023   CREATININE 0.84 12/14/2023   CALCIUM 8.7 (L) 12/14/2023

## 2023-12-14 NOTE — ED Provider Notes (Addendum)
 Signed out patient from PA Sarah Smoot.  Patient is here for unwitnessed fall.  Sustained a facial laceration.  Thus far, workup revealing acute intracranial bleed and pelvic fracture. Physical Exam  BP 137/78   Pulse 85   Temp 98 F (36.7 C) (Oral)   Resp 16   SpO2 100%   Physical Exam  Procedures  .Critical Care  Performed by: Lang Norleen POUR, PA-C Authorized by: Lang Norleen POUR, PA-C   Critical care provider statement:    Critical care time (minutes):  30   Critical care was necessary to treat or prevent imminent or life-threatening deterioration of the following conditions: Acute intracranial bleed after fall.   Critical care was time spent personally by me on the following activities:  Development of treatment plan with patient or surrogate, discussions with consultants, evaluation of patient's response to treatment, examination of patient, ordering and review of laboratory studies, ordering and review of radiographic studies, ordering and performing treatments and interventions, pulse oximetry, re-evaluation of patient's condition and review of old charts   ED Course / MDM   Clinical Course as of 12/14/23 0123  Tue Dec 13, 2023  2352 Fall. Wheelchair bound. CT is pending. Pelvic fracture. IF CT ok can go back to facility (SNF). [JR]  Wed Dec 14, 2023  0007 Per radiology, head ct revealing acute Intraventricular hemorrhage [JR]  0037 Dr. Darnella of neurosurgery recommended admission and every 4 neurochecks. CT head for 5 am. Ordered coags and systolic < 140. [JR]    Clinical Course User Index [JR] Lang Norleen POUR, PA-C   Medical Decision Making Amount and/or Complexity of Data Reviewed Labs: ordered. Radiology: ordered.  Risk Prescription drug management. Decision regarding hospitalization.   Discussed intraventricular hemorrhage with Dr. Darnella. See his recommendations above in the clinical course. Admitted to trauma service with Dr. Lyndel. Also discussed the  pelvic fracture with Dr. Kendal of Ortho. Advised that his team would see him in the morning. No further instructions at this time from ortho.      Lang Norleen POUR, PA-C 12/14/23 9780    Midge Golas, MD 12/14/23 [phone]

## 2023-12-14 NOTE — Evaluation (Signed)
 Clinical/Bedside Swallow Evaluation Patient Details  Name: Tanner Bond MRN: [id number] Date of Birth: 13-Sep-1955  Today's Date: 12/14/2023 Time: SLP Start Time (ACUTE ONLY): 9066 SLP Stop Time (ACUTE ONLY): 0945 SLP Time Calculation (min) (ACUTE ONLY): 12 min  Past Medical History:  Past Medical History:  Diagnosis Date   Alcohol abuse    Dementia (HCC)    Liver dysfunction    Past Surgical History:  Past Surgical History:  Procedure Laterality Date   APPENDECTOMY     as child   HIP ARTHROPLASTY Left 05/13/2023   Procedure: ARTHROPLASTY BIPOLAR HIP (HEMIARTHROPLASTY);  Surgeon: Celena Sharper, MD;  Location: [facility] OR;  Service: Orthopedics;  Laterality: Left;   INTRAMEDULLARY (IM) NAIL INTERTROCHANTERIC Right 10/12/2022   Procedure: INTRAMEDULLARY (IM) NAIL INTERTROCHANTERIC;  Surgeon: Yvone Rush, MD;  Location: [facility] OR;  Service: Orthopedics;  Laterality: Right;   URETHRAL DILATION     as child   HPI:  Tanner Bond is a 68 yo male with PMH of advanced dementia presenting to ED 8/5 with a L forehead laceration s/p ground level fall. CTH shows bilateral IVH (L>R) and a hemorrhage along the L side of the septum pellucidum. SLP evaluated swallow January 2025, which was grossly [facility].    Assessment / Plan / Recommendation  Clinical Impression  Pt is lethargic, opening his eyes briefly when asked but unable to consistently sustain attention to POs. Oral care was provided and he accepted boluses given Max multimodal cueing. He drank large volumes of thin liquids with one instance of delayed coughing. Oral transit was functional with purees but mastication was not initiated when solids were attempted. Lethargy is suspected to affect pt's performance and suspect would greatly impact his ability to get through the course of a meal. For now, recommend he remain NPO except meds crushed in puree and ice chips/sips of water after oral care. Discussed with RN and will f/u as lethargy allows for ongoing  assessment. SLP Visit Diagnosis: Dysphagia, unspecified (R13.10)    Aspiration Risk  Moderate aspiration risk    Diet Recommendation NPO except meds;Ice chips PRN after oral care    Medication Administration: Crushed with puree    Other  Recommendations Oral Care Recommendations: Oral care QID;Oral care prior to ice chip/H20     Assistance Recommended at Discharge    Functional Status Assessment Patient has had a recent decline in their functional status and demonstrates the ability to make significant improvements in function in a reasonable and predictable amount of time.  Frequency and Duration min 2x/week  2 weeks       Prognosis Prognosis for improved oropharyngeal function: Fair Barriers to Reach Goals: Cognitive deficits      Swallow Study   General HPI: Tanner Bond is a 68 yo male with PMH of advanced dementia presenting to ED 8/5 with a L forehead laceration s/p ground level fall. CTH shows bilateral IVH (L>R) and a hemorrhage along the L side of the septum pellucidum. SLP evaluated swallow January 2025, which was grossly [facility]. Type of Study: Bedside Swallow Evaluation Previous Swallow Assessment: see HPI Diet Prior to this Study: NPO Temperature Spikes Noted: No Respiratory Status: Room air History of Recent Intubation: No Behavior/Cognition: Lethargic/Drowsy;Requires cueing Oral Cavity Assessment: Within Functional Limits Oral Care Completed by SLP: Yes Oral Cavity - Dentition: Adequate natural dentition Vision: Functional for self-feeding Self-Feeding Abilities: Total assist Patient Positioning: Upright in bed Baseline Vocal Quality: Not observed Volitional Cough: Weak Volitional Swallow: Able to elicit    Oral/Motor/Sensory Function  Overall Oral Motor/Sensory Function: Within functional limits   Ice Chips Ice chips: Within functional limits Presentation: Spoon   Thin Liquid Thin Liquid: Impaired Presentation: Straw Oral Phase Impairments: Poor awareness  of bolus Pharyngeal  Phase Impairments: Cough - Delayed    Nectar Thick Nectar Thick Liquid: Not tested   Honey Thick Honey Thick Liquid: Not tested   Puree Puree: Within functional limits Presentation: Spoon   Solid     Solid: Impaired Oral Phase Impairments: Poor awareness of bolus      Damien Blumenthal, M.A., CCC-SLP Speech Language Pathology, Acute Rehabilitation Services  Secure Chat preferred [phone]  12/14/2023,9:59 AM

## 2023-12-14 NOTE — ED Provider Notes (Signed)
 I took the call from neurosurgery Dr. Darnella He has reviewed CT imaging.  He recommends checking coags, repeat CT head at 5 AM.  Every 4 hr neurochecks.  He does not require intensive care unit monitoring.  Recommends maintaining systolic blood pressure less than 140   Midge Golas, MD 12/14/23 [phone]

## 2023-12-14 NOTE — Progress Notes (Signed)
 Initial Nutrition Assessment  DOCUMENTATION CODES:   Severe malnutrition in context of chronic illness (dementia, EtOH abuse)  INTERVENTION:  Recommend consult to palliative to establish GOC  Initiate tube feeding via OG/NG/Cortrak: begin tube feeds at 15ml/h and increase 10ml every 10 h until goal rate of 96ml/h is reached  Osmolite 1.5 at 65 ml/h (1560 ml per day)  Provides 2340 kcal, 97 gm protein, 1188 ml free water daily  Pt is at risk for refeeding syndrome given severe malnutrition. Monitor magnesium and phosphorus daily x 5 days, MD to replete as needed. 100mg  thiamine  x 7 days  Will monitor SLP sessions and SLP recommendations for advancement  NUTRITION DIAGNOSIS:   Severe Malnutrition related to chronic illness (dementia, EtOH abuse) as evidenced by severe fat depletion, severe muscle depletion.  GOAL:   Patient will meet greater than or equal to 90% of their needs  MONITOR:   TF tolerance  REASON FOR ASSESSMENT:   Consult Enteral/tube feeding initiation and management  ASSESSMENT:   Pt with hx of dementia and alcohol abuse, non verbal at baseline. Admitted after unwitnessed fall at SNF with intraventricular hemorrhage and pubic rami fractures.  Pt discussed during ICU rounds with RN and MD. Pt with continued disorientation and lethargy. SLP eval recommends NPO for diet due to lethargy, but will continue to work with pt. Surgery would like to initiate placing a feeding tube and beginning feeds. Recommend palliative consult to establish GOC. Neuro and surgery following.  Spoke with wife who was at bedside. Wife reports pt had been living in SNF and was wheelchair bound. Reports pt's dementia has progressed and pt is non verbal. Reports pt would be taken to dining hall to eat meals and it seemed like pt had a good appetite. Wife reports SNF would provide pt with protein shakes as well. Discussed placement of tube per surgery recommendations and explained that the  tube can be used to feed. Wife agreeable to having tube placed but stated she was not sure what pt would want GOC wise. Discussed that we can place tube and initiate feeds for now and adjust as needed.  Medications reviewed and include:  Colace Zoloft  Thiamine  100 mg daily  Labs reviewed:  Baseline Potassium 3.5 Baseline Phos pending Baseline Magnesium pending No recent A1c  NUTRITION - FOCUSED PHYSICAL EXAM: Wheelchair bound at baseline, suspect depletions in BLE due to atrophy, will not use towards malnutrition diagnosis  Flowsheet Row Most Recent Value  Orbital Region Severe depletion  Upper Arm Region Severe depletion  Thoracic and Lumbar Region Severe depletion  Buccal Region Severe depletion  Temple Region Severe depletion  Clavicle Bone Region Severe depletion  Clavicle and Acromion Bone Region Severe depletion  Scapular Bone Region Severe depletion  Dorsal Hand Unable to assess  [mittens]  Patellar Region Severe depletion  Anterior Thigh Region Severe depletion  Posterior Calf Region Severe depletion  Edema (RD Assessment) None  Hair Reviewed  Eyes Unable to assess  Mouth Unable to assess  Skin Reviewed  Nails Reviewed    Diet Order:   Diet Order             Diet NPO time specified  Diet effective now                   EDUCATION NEEDS:   Not appropriate for education at this time  Skin:  Skin Assessment: Reviewed RN Assessment  Last BM:  unknown  Height:   Ht Readings from Last 1 Encounters:  05/13/23 6' 2 (1.88 m)    Weight:   Wt Readings from Last 1 Encounters:  05/13/23 75.6 kg    Ideal Body Weight:  86.4 kg  BMI:  There is no height or weight on file to calculate BMI.  Estimated Nutritional Needs:   Kcal:  2200-2400  Protein:  90-110g  Fluid:  >/= 2L   Josette Glance, MS, RDN, LDN Clinical Dietitian I Please reach out via secure chat

## 2023-12-14 NOTE — Procedures (Signed)
 Cortrak  Person Inserting Tube:  Merilee, Nykayla Marcelli L, RD Tube Type:  Cortrak - 43 inches Tube Size:  10 Tube Location:  Left nare Secured by: Bridle Technique Used to Measure Tube Placement:  Marking at nare/corner of mouth Cortrak Secured At:  77 cm   Cortrak Tube Team Note:  Consult received to place a Cortrak feeding tube.   X-ray is required. RN may begin using tube after placement is confirmed by x-ray.   If the tube becomes dislodged please keep the tube and contact the Cortrak team at [email or web address] for replacement.  If after hours and replacement cannot be delayed, place a NG tube and confirm placement with an abdominal x-ray.    Nestora Merilee RD, LDN Clinical Dietitian

## 2023-12-15 DIAGNOSIS — G300 Alzheimer's disease with early onset: Secondary | ICD-10-CM | POA: Diagnosis not present

## 2023-12-15 DIAGNOSIS — S329XXA Fracture of unspecified parts of lumbosacral spine and pelvis, initial encounter for closed fracture: Secondary | ICD-10-CM | POA: Diagnosis not present

## 2023-12-15 DIAGNOSIS — Z7189 Other specified counseling: Secondary | ICD-10-CM

## 2023-12-15 DIAGNOSIS — F02C0 Dementia in other diseases classified elsewhere, severe, without behavioral disturbance, psychotic disturbance, mood disturbance, and anxiety: Secondary | ICD-10-CM

## 2023-12-15 DIAGNOSIS — I615 Nontraumatic intracerebral hemorrhage, intraventricular: Secondary | ICD-10-CM

## 2023-12-15 LAB — BASIC METABOLIC PANEL WITH GFR
Anion gap: 9 (ref 5–15)
BUN: 21 mg/dL (ref 8–23)
CO2: 25 mmol/L (ref 22–32)
Calcium: 8.6 mg/dL — ABNORMAL LOW (ref 8.9–10.3)
Chloride: 106 mmol/L (ref 98–111)
Creatinine, Ser: 0.77 mg/dL (ref 0.61–1.24)
GFR, Estimated: >60 mL/min (ref >60)
Glucose, Bld: 106 mg/dL — ABNORMAL HIGH (ref 70–99)
Potassium: 3.5 mmol/L (ref 3.5–5.1)
Sodium: 140 mmol/L (ref 135–145)

## 2023-12-15 LAB — CBC
HCT: 36 % — ABNORMAL LOW (ref 39.0–52.0)
Hemoglobin: 12.4 g/dL — ABNORMAL LOW (ref 13.0–17.0)
MCH: 34 pg (ref 26.0–34.0)
MCHC: 34.4 g/dL (ref 30.0–36.0)
MCV: 98.6 fL (ref 80.0–100.0)
Platelets: 169 K/uL (ref 150–400)
RBC: 3.65 MIL/uL — ABNORMAL LOW (ref 4.22–5.81)
RDW: 13 % (ref 11.5–15.5)
WBC: 8.5 K/uL (ref 4.0–10.5)
nRBC: 0 % (ref 0.0–0.2)

## 2023-12-15 LAB — PHOSPHORUS: Phosphorus: 3.1 mg/dL (ref 2.5–4.6)

## 2023-12-15 LAB — GLUCOSE, CAPILLARY
Glucose-Capillary: 102 mg/dL — ABNORMAL HIGH (ref 70–99)
Glucose-Capillary: 94 mg/dL (ref 70–99)
Glucose-Capillary: 147 mg/dL — ABNORMAL HIGH (ref 70–99)
Glucose-Capillary: 119 mg/dL — ABNORMAL HIGH (ref 70–99)
Glucose-Capillary: 136 mg/dL — ABNORMAL HIGH (ref 70–99)

## 2023-12-15 LAB — MAGNESIUM: Magnesium: 2 mg/dL (ref 1.7–2.4)

## 2023-12-15 MED ORDER — METHOCARBAMOL 1000 MG/10ML IJ SOLN
500.0000 mg | Freq: Three times a day (TID) | INTRAMUSCULAR | Status: DC
  Administered 2023-12-16: 500 mg via INTRAVENOUS
  Filled 2023-12-15: qty 10

## 2023-12-15 MED ORDER — POLYETHYLENE GLYCOL 3350 17 G PO PACK: 17.0000 g | PACK | Freq: Every day | ORAL | Status: DC | PRN

## 2023-12-15 MED ORDER — METHOCARBAMOL 500 MG PO TABS: 500.0000 mg | ORAL_TABLET | Freq: Three times a day (TID) | ORAL | Status: AC | PRN

## 2023-12-15 MED ORDER — LORAZEPAM 0.5 MG PO TABS: 0.5000 mg | ORAL_TABLET | Freq: Two times a day (BID) | ORAL | Status: DC | PRN

## 2023-12-15 MED ORDER — DOCUSATE SODIUM 50 MG/5ML PO LIQD
100.0000 mg | Freq: Two times a day (BID) | ORAL | Status: DC
  Administered 2023-12-15 – 2023-12-16 (×2): 100 mg via ORAL
  Filled 2023-12-15 (×2): qty 10

## 2023-12-15 MED ORDER — MEMANTINE HCL 5 MG PO TABS
5.0000 mg | ORAL_TABLET | Freq: Two times a day (BID) | ORAL | Status: DC
  Administered 2023-12-15 – 2023-12-16 (×2): 5 mg via ORAL
  Filled 2023-12-15 (×2): qty 1

## 2023-12-15 MED ORDER — ACETAMINOPHEN 500 MG PO TABS
1000.0000 mg | ORAL_TABLET | Freq: Four times a day (QID) | ORAL | Status: DC
  Administered 2023-12-15 – 2023-12-16 (×3): 1000 mg via ORAL
  Filled 2023-12-15 (×3): qty 2

## 2023-12-15 MED ORDER — LEVETIRACETAM 500 MG PO TABS: 500.0000 mg | ORAL_TABLET | Freq: Two times a day (BID) | ORAL | Status: AC

## 2023-12-15 MED ORDER — METHOCARBAMOL 500 MG PO TABS
500.0000 mg | ORAL_TABLET | Freq: Three times a day (TID) | ORAL | Status: DC
  Administered 2023-12-16: 500 mg via ORAL
  Filled 2023-12-15: qty 1

## 2023-12-15 MED ORDER — THIAMINE MONONITRATE 100 MG PO TABS
100.0000 mg | ORAL_TABLET | Freq: Every day | ORAL | Status: DC
  Administered 2023-12-16: 100 mg via ORAL
  Filled 2023-12-15: qty 1

## 2023-12-15 MED ORDER — DONEPEZIL HCL 10 MG PO TABS
10.0000 mg | ORAL_TABLET | Freq: Every day | ORAL | Status: DC
  Administered 2023-12-15: 10 mg via ORAL
  Filled 2023-12-15: qty 1

## 2023-12-15 MED ORDER — ACETAMINOPHEN 500 MG PO TABS: 1000.0000 mg | ORAL_TABLET | Freq: Four times a day (QID) | ORAL | Status: AC | PRN

## 2023-12-15 MED ORDER — LEVETIRACETAM 500 MG PO TABS
500.0000 mg | ORAL_TABLET | Freq: Two times a day (BID) | ORAL | Status: DC
  Administered 2023-12-15 – 2023-12-16 (×2): 500 mg via ORAL
  Filled 2023-12-15 (×2): qty 1

## 2023-12-15 MED ORDER — BUSPIRONE HCL 10 MG PO TABS
10.0000 mg | ORAL_TABLET | Freq: Three times a day (TID) | ORAL | Status: DC
  Administered 2023-12-15 – 2023-12-16 (×3): 10 mg via ORAL
  Filled 2023-12-15 (×3): qty 1

## 2023-12-15 MED ORDER — QUETIAPINE FUMARATE 50 MG PO TABS
25.0000 mg | ORAL_TABLET | Freq: Every day | ORAL | Status: DC
  Administered 2023-12-15: 25 mg via ORAL
  Filled 2023-12-15: qty 1

## 2023-12-15 MED ORDER — TRAMADOL HCL 50 MG PO TABS: 25.0000 mg | ORAL_TABLET | Freq: Four times a day (QID) | ORAL | Status: DC | PRN

## 2023-12-15 MED ORDER — SERTRALINE HCL 100 MG PO TABS
100.0000 mg | ORAL_TABLET | Freq: Every day | ORAL | Status: DC
  Administered 2023-12-16: 100 mg via ORAL
  Filled 2023-12-15: qty 1

## 2023-12-15 NOTE — Evaluation (Signed)
 Physical Therapy Evaluation Patient Details Name: Tanner Bond MRN: [id number] DOB: Oct 20, 1955 Today's Date: 12/15/2023  History of Present Illness  Patient is a 68 y/o male admitted from SNF [facility]) where he is a LTC resident.  He had an unwitnessed fall and found to have small scattered IVH (stable) and bilateral pubic rami fractures (WBAT).  PMH positive for dementia, ETOH, liver dysfunction, fall in January this year with L hip fx s/p hemiarthroplasty and in June last year with L hip IT fx s/p IM nail.  Clinical Impression  Patient presents with mobility not far from baseline as wife and sister report basically bedbound and dependent for BADL's at facility.  Was getting to wheelchair though not recently with two hip fractures prior to this fall since last June.  Patient able to sit at EOB with +1 total A.  Still pushing back at times and not safe to transfer as well as grimacing with movement of his legs.  Feel he is stable to return to facility and not a candidate for skilled PT at this time.  PT will sign off.         If plan is discharge home, recommend the following:     Can travel by private vehicle   No    Equipment Recommendations None recommended by PT  Recommendations for Other Services       Functional Status Assessment Patient has not had a recent decline in their functional status     Precautions / Restrictions Precautions Precautions: Fall Recall of Precautions/Restrictions: Impaired Restrictions Other Position/Activity Restrictions: WBAT      Mobility  Bed Mobility Overal bed mobility: Needs Assistance Bed Mobility: Supine to Sit, Sit to Supine     Supine to sit: HOB elevated, Total assist Sit to supine: Total assist   General bed mobility comments: assisted legs off EOB and pt attempting to pull up though unsuccessful so PT assisted from behind to sit up with back support; to supine assist for legs and repositioning trunk then NT in to help to  scoot to [facility]    Transfers                   General transfer comment: unsafe to attempt    Ambulation/Gait                  Stairs            Wheelchair Mobility     Tilt Bed    Modified Rankin (Stroke Patients Only)       Balance Overall balance assessment: Needs assistance   Sitting balance-Leahy Scale: Poor Sitting balance - Comments: leaning back some at times sitting with close S briefly unsupported Postural control: Posterior lean                                   Pertinent Vitals/Pain Pain Assessment Pain Assessment: PAINAD Breathing: normal Negative Vocalization: occasional moan/groan, low speech, negative/disapproving quality Facial Expression: sad, frightened, frown Body Language: tense, distressed pacing, fidgeting Consolability: distracted or reassured by voice/touch PAINAD Score: 4    Home Living Family/patient expects to be discharged to:: Skilled nursing facility                        Prior Function               Mobility Comments: family present, states pt basically  bedbound ADLs Comments: has assist for feeding and all BADL's     Extremity/Trunk Assessment   Upper Extremity Assessment Upper Extremity Assessment: RUE deficits/detail;LUE deficits/detail RUE Deficits / Details: able to pull and grasp at lines and attempt to pull up to sit, full ROM testing not performed LUE Deficits / Details: able to pull and grasp at lines and attempt to pull up to sit, full ROM testing not performed    Lower Extremity Assessment Lower Extremity Assessment: LLE deficits/detail;RLE deficits/detail RLE Deficits / Details: not lifting antigravity though draws up when in flat supine for scooting up to [facility]; grimacing when PT moving legs off EOB LLE Deficits / Details: not lifting antigravity though draws up when in flat supine for scooting up to HOB; grimacing when PT moving legs off EOB    Cervical / Trunk  Assessment Cervical / Trunk Assessment: Kyphotic  Communication   Communication Communication: No apparent difficulties    Cognition Arousal: Alert Behavior During Therapy: Restless   PT - Cognitive impairments: History of cognitive impairments                       PT - Cognition Comments: pulling at lines, following occasional one step commands with multimodal cues, increased time for forward attention, self distracted easily with lines Following commands: Impaired Following commands impaired: Follows one step commands inconsistently     Cueing Cueing Techniques: Verbal cues, Gestural cues, Tactile cues, Visual cues     General Comments General comments (skin integrity, edema, etc.): wife, Leita,  present initially then left, sister, Mliss, in the room    Exercises     Assessment/Plan    PT Assessment Patient does not need any further PT services  PT Problem List         PT Treatment Interventions      PT Goals (Current goals can be found in the Care Plan section)  Acute Rehab PT Goals PT Goal Formulation: All assessment and education complete, DC therapy    Frequency       Co-evaluation               AM-PAC PT 6 Clicks Mobility  Outcome Measure Help needed turning from your back to your side while in a flat bed without using bedrails?: Total Help needed moving from lying on your back to sitting on the side of a flat bed without using bedrails?: Total Help needed moving to and from a bed to a chair (including a wheelchair)?: Total Help needed standing up from a chair using your arms (e.g., wheelchair or bedside chair)?: Total Help needed to walk in hospital room?: Total Help needed climbing 3-5 steps with a railing? : Total 6 Click Score: 6    End of Session   Activity Tolerance: Patient limited by pain Patient left: in bed;with family/visitor present;with bed alarm set;with restraints reapplied   PT Visit Diagnosis: Repeated falls  (R29.6);Muscle weakness (generalized) (M62.81)    Time: 8499-8464 PT Time Calculation (min) (ACUTE ONLY): 35 min   Charges:   PT Evaluation $PT Eval Moderate Complexity: 1 Mod PT Treatments $Therapeutic Activity: 8-22 mins PT General Charges $$ ACUTE PT VISIT: 1 Visit         Micheline Portal, PT Acute Rehabilitation Services Office:[phone] 12/15/2023   Montie Portal 12/15/2023, 5:06 PM

## 2023-12-15 NOTE — Progress Notes (Signed)
 Speech Language Pathology Treatment: Dysphagia  Patient Details Name: Tanner Bond MRN: [id number] DOB: May 31, 1955 Today's Date: 12/15/2023 Time: 1000-1011 SLP Time Calculation (min) (ACUTE ONLY): 11 min  Assessment / Plan / Recommendation Clinical Impression  Pt is more alert today, actively accepting boluses into his oral cavity and initiating prompt transit. Overall, no s/s of dysphagia or aspiration were observed when he is assisted with feeding. He drank the contents of an 8 oz cup of water via straw without coughing. Baseline dementia puts him at slightly increased risk of dysphagia so full supervision and careful hand feeding are encouraged. Recommend regular diet with thin liquids. Discussed with RN and SLP will f/u at least briefly.    HPI HPI: Tanner Bond is a 68 yo male with PMH of advanced dementia presenting to ED 8/5 with a L forehead laceration s/p ground level fall. CTH shows bilateral IVH (L>R) and a hemorrhage along the L side of the septum pellucidum. SLP evaluated swallow January 2025, which was grossly [facility].      SLP Plan  Continue with current plan of care          Recommendations  Diet recommendations: Regular;Thin liquid Liquids provided via: Cup;Straw Medication Administration: Whole meds with liquid Supervision: Staff to assist with self feeding;Full supervision/cueing for compensatory strategies;Trained caregiver to feed patient Compensations: Minimize environmental distractions;Slow rate;Small sips/bites Postural Changes and/or Swallow Maneuvers: Seated upright 90 degrees                  Oral care BID   Frequent or constant Supervision/Assistance Dysphagia, unspecified (R13.10)     Continue with current plan of care     Damien Blumenthal, M.A., CCC-SLP Speech Language Pathology, Acute Rehabilitation Services  Secure Chat preferred [phone]   12/15/2023, 10:40 AM

## 2023-12-15 NOTE — TOC Initial Note (Signed)
 Transition of Care [facility]) - Initial/Assessment Note    Patient Details  Name: Tanner Bond MRN: [id number] Date of Birth: December 24, 1955  [facility] of [facility]) CM/SW Contact:    Kiyani Jernigan E Lowella Kindley, LCSW Phone Number: 12/15/2023, 2:52 PM  Clinical Narrative:                 Patient was admitted post fall. Met with patient's spouse at bedside. She states patient is from [facility] long term care and they would like patient to return there at DC.  Palliative consult is pending.   CSW called Darrian at [facility] - she states patient can return, they requested PT/OT evals to see if patient needs rehab when he returns - evals ordered by PA. Darrian is aware of patient needing palliative follow up as well.  Expected Discharge Plan: Skilled Nursing Facility Barriers to Discharge: Continued Medical Work up   Patient Goals and CMS Choice Patient states their goals for this hospitalization and ongoing recovery are:: return to [email or web address] Compare Post Acute Care list provided to:: Patient Represenative (must comment) Choice offered to / list presented to : Spouse      Expected Discharge Plan and Services       Living arrangements for the past 2 months: Skilled Nursing Facility                                      Prior Living Arrangements/Services Living arrangements for the past 2 months: Skilled Nursing Facility Lives with:: Facility Resident Patient language and need for interpreter reviewed:: Yes Do you feel safe going back to the place where you live?: Yes      Need for Family Participation in Patient Care: Yes (Comment) Care giver support system in place?: Yes (comment)   Criminal Activity/Legal Involvement Pertinent to Current Situation/Hospitalization: No - Comment as needed  Activities of Daily Living      Permission Sought/Granted Permission sought to share information with : Facility Industrial/product designer granted  to share information with : Yes, Verbal Permission Granted (by wife)     Permission granted to share info w AGENCY: [facility]        Emotional Assessment         Alcohol / Substance Use: Not Applicable Psych Involvement: No (comment)  Admission diagnosis:  Facial laceration, initial encounter [S01.81XA] Fall, initial encounter [W19.XXXA] Bleeding in head following injury with loss of consciousness (HCC) [S06.309A] Closed nondisplaced fracture of pelvis, unspecified part of pelvis, initial encounter (HCC) [S32.9XXA] Patient Active Problem List   Diagnosis Date Noted   Bleeding in head following injury with loss of consciousness (HCC) 12/14/2023   Closed displaced fracture of neck of left femur (HCC) 05/12/2023   Closed right hip fracture (HCC) 10/11/2022   Intraventricular hemorrhage (HCC) 10/11/2022   Dementia with behavioral disturbance (HCC) 10/11/2022   Hypokalemia 10/11/2022   Tobacco use disorder 10/11/2022   Acne 09/26/2017   BPH associated with nocturia 07/06/2017   Insomnia 07/06/2017   Alcoholic liver disease (HCC) 07/05/2017   Hypertension 04/13/2017   Alcoholism in remission (HCC) 04/12/2017   History of adenomatous polyp of colon 04/12/2017   PCP:  Donnella Agreste Pharmacy:   [facility] [phone] - [place], [place] - [address] [place] [address] Phone: [phone]  Fax: [phone]  [id number] - [place], [place] - [address] [place] [address] Phone: [phone] Fax: [phone]     Social Drivers of Health (SDOH) Social History: SDOH Screenings   Food Insecurity: Patient Unable To Answer (12/15/2023)  Housing: Patient Unable To Answer (12/15/2023)  Transportation Needs: Patient Unable To Answer (12/15/2023)  Utilities: Patient Unable To Answer (12/15/2023)  Social Connections: Patient Unable To Answer (12/15/2023)   Tobacco Use: High Risk (12/13/2023)   SDOH Interventions:     Readmission Risk Interventions     No data to display

## 2023-12-15 NOTE — Consult Note (Signed)
 Consultation Note Date: 12/15/2023   Patient Name: Tanner Bond  DOB: Oct 19, 1955  MRN: [id number]  Age / Sex: 68 y.o., male  PCP: Place, Emmalene Referring Physician: Md, Trauma, MD  Reason for Consultation: endstage dementia, family has a lot of questions, they need assistance with exploring options  HPI/Patient Profile: 68 y.o. male  with past medical history of early onset dementia, BPH, falls, wheelchair bound at baseline, long term care resident at [facility], admitted on 12/13/2023 with fall. Workup reveals pelvic fractures and IVH. Palliative consulted for GOC.    Primary Decision Maker NEXT OF KIN - spouse- Leita  Discussion: Chart reviewed including labs, progress notes, imaging from this and previous encounters.  Initial CT reviewed noting small ICH, repeat scan was stable.  Reviewed SLP note- patient passed swallow evaluation with recommendations for regular diet.  On evaluation patient was awake, smiles intermittently, answers questions intermittently. His spouse and sister at bedside reported his mental status was greatly improved today compared to yesterday. He ate a large lunch. Appears to be at baseline.  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 He and spouse lived in [place] for 11 years. He was very very intelligent and very very funny. Leita notes that he still sometimes is able to crack a joke.  He has good quality of life at [facility]. Has a private caregiver who visits. He is wheelchair bound. Must be hand fed. Requires assistance with all ADLs. Able to recognize family and smile.  We discussed his current acute and chronic issues.  We discussed the progressive nature of dementia. Leita and his sister are familiar with dementia having experienced it with their mother.  For now they feel Harmon has good  quality of life and would want to treat what is treatable. However, if he were to lose the ability to swallow, they would not want a permanent feeding tube.  They are hopeful he can return soon to [facility] which is a familiar environment and he is well cared for.  We discussed outpatient Palliative followup and they are in agreement.       SUMMARY OF RECOMMENDATIONS -IVH, pelvic fractures (no surgical interventions due to wheelchair bound status)- continue current interventions, good pain management- agree with scheduled acetaminophenf -Patient has DNR in place, would not want permanent feeding tube- family is requesting removal of cortrak now due to patient eating well- order entered and meds changed to PO -GOC is d/c back to [facility] LTC  -TOC order placed for outpatient Palliative referral    Code Status/Advance Care Planning:   Code Status: Do not attempt resuscitation (DNR) PRE-ARREST INTERVENTIONS DESIRED    Prognosis:   Unable to determine  Discharge Planning: LTC [facility] with Palliative  Primary Diagnoses: Present on Admission:  Bleeding in head following injury with loss of consciousness (HCC)   Review of Systems  Unable to perform ROS: Mental status change    Physical Exam Vitals and nursing  note reviewed.  Eyes:     Comments: L periorbital bruising, laceration above L eye  Cardiovascular:     Rate and Rhythm: Normal rate.  Pulmonary:     Effort: Pulmonary effort is normal.  Neurological:     Mental Status: He is alert. He is disoriented.     Vital Signs: BP 104/76 (BP Location: Right Arm)   Pulse 79   Temp 97.7 F (36.5 C) (Oral)   Resp 17   Wt 67.4 kg   SpO2 90%   BMI 19.08 kg/m  Pain Scale: PAINAD   Pain Score: Asleep   SpO2: SpO2: 90 % O2 Device:SpO2: 90 % O2 Flow Rate: .   IO: Intake/output summary:  Intake/Output Summary (Last 24 hours) at 12/15/2023 1508 Last data filed at 12/15/2023 1229 Gross per 24 hour  Intake 499.17 ml   Output 300 ml  Net 199.17 ml    LBM:   Baseline Weight: Weight: 67.4 kg Most recent weight: Weight: 67.4 kg       Thank you for this consult. Palliative medicine will continue to follow and assist as needed.   Signed by: Cassondra Stain, AGNP-C Palliative Medicine  Time includes:   Preparing to see the patient (e.g., review of tests) Obtaining and/or reviewing separately obtained history Performing a medically necessary appropriate examination and/or evaluation Counseling and educating the patient/family/caregiver Ordering medications, tests, or procedures Referring and communicating with other health care professionals (when not reported separately) Documenting clinical information in the electronic or other health record Independently interpreting results (not reported separately) and communicating results to the patient/family/caregiver Care coordination (not reported separately) Clinical documentation   Please contact Palliative Medicine Team phone at [phone] for questions and concerns.  For individual provider: See Tracey

## 2023-12-15 NOTE — Progress Notes (Signed)
 This nurse removed patient's cortrak per order. Patient tolerated well.  Mliss Slight, RN

## 2023-12-15 NOTE — Progress Notes (Signed)
 Assessment/Plan: 68 y/o M w/ hx alcoholism, dementia who presents after GLF, found to have small scattered IVH. Stable on repeat imaging   LOS: 1 day  SBP<160 AAT DAT Q4 neuro checks Ok for DVT chemoppx on 8/9 Neurosurgery team to sign off. Thank you for allowing us  to participate in the care of this patient. Please do not hesitate to call us  with questions or concerns. Follow-up PRN   Subjective: Nae o/n  Objective: Vital signs in last 24 hours: Temp:  [96.9 F (36.1 C)-100.2 F (37.9 C)] 97.7 F (36.5 C) (08/07 0340) Pulse Rate:  [64-88] 64 (08/07 0340) Resp:  [11-17] 11 (08/07 0340) BP: (99-117)/(57-73) 117/67 (08/07 0340) SpO2:  [96 %-100 %] 100 % (08/07 0340)  Intake/Output from previous day: 08/06 0701 - 08/07 0700 In: 26.3 [NG/GT:26.3] Out: 550 [Urine:550] Intake/Output this shift: No intake/output data recorded.  GCS 4E 3V 3M Minimal speech. Oriented to last name only PERRL, conjugate gaze Localizes all 4 extremities. Will squeeze hands to command   Lab Results: Recent Labs    12/14/23 0015 12/14/23 0633  WBC 15.0* 10.8*  HGB 13.5 12.6*  HCT 38.8* 36.3*  PLT 216 201   BMET Recent Labs    12/14/23 0015 12/14/23 0633  NA 139 139  K 3.9 3.5  CL 105 107  CO2 25 23  GLUCOSE 138* 129*  BUN 18 17  CREATININE 0.90 0.84  CALCIUM 9.2 8.7*    Studies/Results: CT THORACIC SPINE WO CONTRAST Result Date: 12/14/2023 CLINICAL DATA:  Back trauma, no prior imaging (Age >= 16y) EXAM: CT THORACIC, AND LUMBAR SPINE WITHOUT CONTRAST TECHNIQUE: Multidetector CT imaging of the thoracic and lumbar spine was performed without intravenous contrast. Multiplanar CT image reconstructions were also generated. RADIATION DOSE REDUCTION: This exam was performed according to the departmental dose-optimization program which includes automated exposure control, adjustment of the mA and/or kV according to patient size and/or use of iterative reconstruction technique. COMPARISON:   None Available. FINDINGS: CT THORACIC SPINE FINDINGS Alignment: Exaggerated thoracic kyphosis. No substantial sagittal subluxation. Dextrocurvature. Vertebrae: No evidence of acute fracture. Vertebral body heights are maintained. Osteopenia. Paraspinal and other soft tissues: Negative. Disc levels: Moderate multilevel degenerative change. CT LUMBAR SPINE FINDINGS Alignment: No substantial sagittal subluxation. Vertebrae: Vertebral body heights are maintained.  Osteopenia. Paraspinal and other soft tissues: Aortic atherosclerosis. Disc levels: Moderate lower lumbar degenerative change including degenerative disc disease is greatest at L5-S1. IMPRESSION: 1. No evidence of acute fracture or traumatic malalignment. 2. Osteopenia. Electronically Signed   By: Gilmore GORMAN Molt M.D.   On: 12/14/2023 22:31   CT LUMBAR SPINE WO CONTRAST Result Date: 12/14/2023 CLINICAL DATA:  Back trauma, no prior imaging (Age >= 16y) EXAM: CT THORACIC, AND LUMBAR SPINE WITHOUT CONTRAST TECHNIQUE: Multidetector CT imaging of the thoracic and lumbar spine was performed without intravenous contrast. Multiplanar CT image reconstructions were also generated. RADIATION DOSE REDUCTION: This exam was performed according to the departmental dose-optimization program which includes automated exposure control, adjustment of the mA and/or kV according to patient size and/or use of iterative reconstruction technique. COMPARISON:  None Available. FINDINGS: CT THORACIC SPINE FINDINGS Alignment: Exaggerated thoracic kyphosis. No substantial sagittal subluxation. Dextrocurvature. Vertebrae: No evidence of acute fracture. Vertebral body heights are maintained. Osteopenia. Paraspinal and other soft tissues: Negative. Disc levels: Moderate multilevel degenerative change. CT LUMBAR SPINE FINDINGS Alignment: No substantial sagittal subluxation. Vertebrae: Vertebral body heights are maintained.  Osteopenia. Paraspinal and other soft tissues: Aortic  atherosclerosis. Disc levels: Moderate lower lumbar degenerative change  including degenerative disc disease is greatest at L5-S1. IMPRESSION: 1. No evidence of acute fracture or traumatic malalignment. 2. Osteopenia. Electronically Signed   By: Gilmore GORMAN Molt M.D.   On: 12/14/2023 22:31   DG Abd Portable 1V Result Date: 12/14/2023 CLINICAL DATA:  [id number] Encounter for feeding tube placement [id number] EXAM: PORTABLE ABDOMEN - 1 VIEW COMPARISON:  None Available. FINDINGS: The bowel gas pattern is non-obstructive. No evidence of pneumoperitoneum. No acute osseous abnormalities. The soft tissues are within normal limits. Surgical changes, devices, tubes and lines: Weighted Dobbhoff tube noted coursing below the left hemidiaphragm with its tip overlying the left paramedian lower abdomen, overlying the region of pylorus of the stomach. IMPRESSION: Weighted Dobbhoff tube noted coursing below the left hemidiaphragm with its tip overlying the left paramedian lower abdomen, overlying the region of pylorus of the stomach. Electronically Signed   By: Ree Molt M.D.   On: 12/14/2023 15:01   CT Head Wo Contrast Result Date: 12/14/2023 EXAM: CT HEAD WITHOUT CONTRAST 12/14/2023 05:17:07 AM TECHNIQUE: CT of the head was performed without the administration of intravenous contrast. Automated exposure control, iterative reconstruction, and/or weight based adjustment of the mA/kV was utilized to reduce the radiation dose to as low as reasonably achievable. COMPARISON: None available. CLINICAL HISTORY: Head trauma, moderate-severe. Chief complaints; Fall; CT Head Wo Contrast; Head trauma, moderate-severe FINDINGS: BRAIN AND VENTRICLES: Bilateral intraventricular hemorrhage is stable, left greater than right. An additional 9 mm focus of hemorrhage along the left side of the septum pellucidum is stable. Moderate atrophy and white matter changes are stable. The ventricles are proportional to the degree of atrophy and stable in  size. ORBITS: Peri-orbital hematoma is stable. SINUSES: No acute abnormality. SOFT TISSUES AND SKULL: A left supraorbital scalp laceration is present. No underlying fracture is present. IMPRESSION: 1. Stable bilateral intraventricular hemorrhage, left greater than right, and a stable 9 mm focus of hemorrhage along the left side of the septum pellucidum. 2. Stable moderate atrophy and white matter changes. 3. Left supraorbital scalp laceration and stable peri-orbital hematoma without underlying fracture. Electronically signed by: Lonni Necessary MD 12/14/2023 05:35 AM EDT RP Workstation: [id number]   CT PELVIS WO CONTRAST Result Date: 12/14/2023 CLINICAL DATA:  Hip trauma EXAM: CT PELVIS WITHOUT CONTRAST TECHNIQUE: Multidetector CT imaging of the pelvis was performed following the standard protocol without intravenous contrast. RADIATION DOSE REDUCTION: This exam was performed according to the departmental dose-optimization program which includes automated exposure control, adjustment of the mA and/or kV according to patient size and/or use of iterative reconstruction technique. COMPARISON:  12/13/2023 radiograph, hip CT 10/11/2022 FINDINGS: Urinary Tract:  No abnormality visualized. Bowel: Diverticular disease of the colon without acute wall thickening. Moderate rectal distension by feces. Vascular/Lymphatic: Atherosclerosis. No aneurysm. No suspicious lymph nodes. Reproductive:  Negative prostate. Other:  Negative for pelvic effusion. Musculoskeletal: Left hip replacement and intramedullary rodding of the right femur with artifact. Old right intertrochanteric fracture. Acute minimally displaced bilateral inferior pubic rami fractures. Acute minimally displaced right superior pubic ramus fracture near the symphysis. The symphysis is not widened. Difficult to exclude subtle nondisplaced fracture at the left puboacetabular junction, coronal series 9, image 76, but limited by artifact. IMPRESSION: 1. Partially  visualized intramedullary rod in the right femur. Status post left hip replacement. Acute minimally displaced bilateral inferior pubic rami fractures. Acute minimally displaced right superior pubic ramus fracture near the symphysis. Difficult to exclude subtle nondisplaced fracture at the left puboacetabular junction, artifact from left hip hardware limits further details. 2. Diverticular  disease of the colon without acute wall thickening. Moderate rectal distension by feces. 3. Aortic atherosclerosis. Aortic Atherosclerosis (ICD10-I70.0). Electronically Signed   By: Luke Bun M.D.   On: 12/14/2023 00:16   CT Head Wo Contrast Result Date: 12/14/2023 EXAM: CT HEAD AND CERVICAL SPINE 12/13/2023 11:54:16 PM TECHNIQUE: CT of the head and cervical spine was performed without the administration of intravenous contrast. Multiplanar reformatted images are provided for review. Automated exposure control, iterative reconstruction, and/or weight based adjustment of the mA/kV was utilized to reduce the radiation dose to as low as reasonably achievable. COMPARISON: Comparison with CT head 07/22/2023 and CT Cervical spine 07/22/23. CLINICAL HISTORY: Head trauma, minor (Age >= 65y). Uncontrollable shivering. Straps x2, coban, warm blankets and pt still shivering. Scanned cspine 2x to try to get without motion. FINDINGS: CT HEAD BRAIN AND VENTRICLES: Dependent intraventricular hemorrhage in the left greater than right occipital horns of the lateral ventricles. Additional focus of intraventricular hemorrhage along the posterior septum pellucidum in the atrium of the left lateral ventricle (series 3, image 20). No evidence of intraparenchymal, subdural, or subarachnoid hemorrhage. No mass effect. Basal cisterns are patent. No evidence of acute infarct. Similar ventriculomegaly. ORBITS: Left periorbital hematoma. SINUSES AND MASTOIDS: No acute abnormality. SOFT TISSUES AND SKULL: No calvarial fracture. CT CERVICAL SPINE BONES AND  ALIGNMENT: No acute fracture or traumatic malalignment. DEGENERATIVE CHANGES: Age-advanced spondylosis, disc space height loss, and degenerative endplate changes greatest at C5-C6 and C6-C7. Posterior disc osteophyte complex causes mild spinal canal narrowing at C4 to C5. SOFT TISSUES: No prevertebral soft tissue swelling. IMPRESSION: 1. Left greater than right acute intraventricular hemorrhage. 2. Left periorbital hematoma. No calvarial fracture. 3. No acute fracture or traumatic malalignment in the cervical spine. 4. Critical value/emergent results were called by telephone at the time of interpretation on 12/14/23 at 12:12 AM to provider Sandra Essex, who verbally acknowledged these results. Electronically signed by: Norman Gatlin MD 12/14/2023 12:13 AM EDT RP Workstation: [id number]   CT Cervical Spine Wo Contrast Result Date: 12/14/2023 EXAM: CT HEAD AND CERVICAL SPINE 12/13/2023 11:54:16 PM TECHNIQUE: CT of the head and cervical spine was performed without the administration of intravenous contrast. Multiplanar reformatted images are provided for review. Automated exposure control, iterative reconstruction, and/or weight based adjustment of the mA/kV was utilized to reduce the radiation dose to as low as reasonably achievable. COMPARISON: Comparison with CT head 07/22/2023 and CT Cervical spine 07/22/23. CLINICAL HISTORY: Head trauma, minor (Age >= 65y). Uncontrollable shivering. Straps x2, coban, warm blankets and pt still shivering. Scanned cspine 2x to try to get without motion. FINDINGS: CT HEAD BRAIN AND VENTRICLES: Dependent intraventricular hemorrhage in the left greater than right occipital horns of the lateral ventricles. Additional focus of intraventricular hemorrhage along the posterior septum pellucidum in the atrium of the left lateral ventricle (series 3, image 20). No evidence of intraparenchymal, subdural, or subarachnoid hemorrhage. No mass effect. Basal cisterns are patent. No evidence of  acute infarct. Similar ventriculomegaly. ORBITS: Left periorbital hematoma. SINUSES AND MASTOIDS: No acute abnormality. SOFT TISSUES AND SKULL: No calvarial fracture. CT CERVICAL SPINE BONES AND ALIGNMENT: No acute fracture or traumatic malalignment. DEGENERATIVE CHANGES: Age-advanced spondylosis, disc space height loss, and degenerative endplate changes greatest at C5-C6 and C6-C7. Posterior disc osteophyte complex causes mild spinal canal narrowing at C4 to C5. SOFT TISSUES: No prevertebral soft tissue swelling. IMPRESSION: 1. Left greater than right acute intraventricular hemorrhage. 2. Left periorbital hematoma. No calvarial fracture. 3. No acute fracture or traumatic malalignment in the  cervical spine. 4. Critical value/emergent results were called by telephone at the time of interpretation on 12/14/23 at 12:12 AM to provider Sandra Essex, who verbally acknowledged these results. Electronically signed by: Norman Gatlin MD 12/14/2023 12:13 AM EDT RP Workstation: [id number]   DG Chest Portable 1 View Result Date: 12/13/2023 EXAM: 1 VIEW(S) XRAY OF THE CHEST 12/13/2023 09:44:49 PM COMPARISON: Comparison with 07/22/2023 CLINICAL HISTORY: fall FINDINGS: LUNGS AND PLEURA: No focal pulmonary opacity. No pulmonary edema. No pleural effusion. No pneumothorax. HEART AND MEDIASTINUM: No acute abnormality of the cardiac and mediastinal silhouettes. BONES AND SOFT TISSUES: No acute osseous abnormality. IMPRESSION: 1. No acute process. Electronically signed by: Norman Gatlin MD 12/13/2023 10:02 PM EDT RP Workstation: [id number]   DG Pelvis Portable Result Date: 12/13/2023 CLINICAL DATA:  68 year old post fall. EXAM: PORTABLE PELVIS 1-2 VIEWS COMPARISON:  07/22/2023 FINDINGS: The bones are subjectively under mineralized. Findings suggestive of right superior ramus fracture. Potential right inferior ramus fracture. Pubic symphysis and sacroiliac joints are congruent. Left hip hemiarthroplasty, intact were visualized.  Right femoral intramedullary nail with trans trochanteric screw fixation. Chronic fragmentation superior to the right greater trochanter. IMPRESSION: 1. Findings suggestive of right superior ramus fracture. Potential right inferior ramus fracture. 2. Postsurgical change of both hips with intact hardware. Electronically Signed   By: Andrea Gasman M.D.   On: 12/13/2023 21:59      Dorn JONELLE Glade 12/15/2023, 8:09 AM

## 2023-12-15 NOTE — Progress Notes (Addendum)
 Progress Note     Subjective: Pt alert and appears comfortable. Baseline is oriented only to self. No family at bedside this AM.   Objective: Vital signs in last 24 hours: Temp:  [96.9 F (36.1 C)-100.2 F (37.9 C)] 97.7 F (36.5 C) (08/07 0340) Pulse Rate:  [64-88] 64 (08/07 0340) Resp:  [11-17] 11 (08/07 0340) BP: (99-117)/(57-73) 117/67 (08/07 0340) SpO2:  [97 %-100 %] 100 % (08/07 0340)    Intake/Output from previous day: 08/06 0701 - 08/07 0700 In: 26.3 [NG/GT:26.3] Out: 550 [Urine:550] Intake/Output this shift: No intake/output data recorded.  PE: General: WD, cachectic male who is laying in bed in NAD HEENT:  laceration to left temple with sutures present and scab, mild ecchymosis over left temple Heart: regular, rate, and rhythm. Palpable radial and pedal pulses bilaterally Lungs: CTAB, no wheezes, rhonchi, or rales noted.  Respiratory effort nonlabored Abd: soft, NT, ND, +BS, no masses, hernias, or organomegaly MS: all 4 extremities are symmetrical with no cyanosis, clubbing, or edema. Skin: warm and dry with no masses, lesions, or rashes Neuro: able to follow some simple commands for me  Psych: oriented to self only   Lab Results:  Recent Labs    12/14/23 0015 12/14/23 0633  WBC 15.0* 10.8*  HGB 13.5 12.6*  HCT 38.8* 36.3*  PLT 216 201   BMET Recent Labs    12/14/23 0015 12/14/23 0633  NA 139 139  K 3.9 3.5  CL 105 107  CO2 25 23  GLUCOSE 138* 129*  BUN 18 17  CREATININE 0.90 0.84  CALCIUM 9.2 8.7*   PT/INR Recent Labs    12/14/23 0128  LABPROT 16.0*  INR 1.2   CMP     Component Value Date/Time   NA 139 12/14/2023 0633   K 3.5 12/14/2023 0633   CL 107 12/14/2023 0633   CO2 23 12/14/2023 0633   GLUCOSE 129 (H) 12/14/2023 0633   BUN 17 12/14/2023 0633   CREATININE 0.84 12/14/2023 0633   CALCIUM 8.7 (L) 12/14/2023 0633   PROT 6.6 07/22/2023 0835   ALBUMIN 3.4 (L) 07/22/2023 0835   AST 24 07/22/2023 0835   ALT 16 07/22/2023  0835   ALKPHOS 95 07/22/2023 0835   BILITOT 0.6 07/22/2023 0835   GFRNONAA >60 12/14/2023 0633   GFRAA >60 04/08/2017 1600   Lipase  No results found for: LIPASE     Studies/Results: CT THORACIC SPINE WO CONTRAST Result Date: 12/14/2023 CLINICAL DATA:  Back trauma, no prior imaging (Age >= 16y) EXAM: CT THORACIC, AND LUMBAR SPINE WITHOUT CONTRAST TECHNIQUE: Multidetector CT imaging of the thoracic and lumbar spine was performed without intravenous contrast. Multiplanar CT image reconstructions were also generated. RADIATION DOSE REDUCTION: This exam was performed according to the departmental dose-optimization program which includes automated exposure control, adjustment of the mA and/or kV according to patient size and/or use of iterative reconstruction technique. COMPARISON:  None Available. FINDINGS: CT THORACIC SPINE FINDINGS Alignment: Exaggerated thoracic kyphosis. No substantial sagittal subluxation. Dextrocurvature. Vertebrae: No evidence of acute fracture. Vertebral body heights are maintained. Osteopenia. Paraspinal and other soft tissues: Negative. Disc levels: Moderate multilevel degenerative change. CT LUMBAR SPINE FINDINGS Alignment: No substantial sagittal subluxation. Vertebrae: Vertebral body heights are maintained.  Osteopenia. Paraspinal and other soft tissues: Aortic atherosclerosis. Disc levels: Moderate lower lumbar degenerative change including degenerative disc disease is greatest at L5-S1. IMPRESSION: 1. No evidence of acute fracture or traumatic malalignment. 2. Osteopenia. Electronically Signed   By: Gilmore GORMAN Joshua CHRISTELLA.D.  On: 12/14/2023 22:31   CT LUMBAR SPINE WO CONTRAST Result Date: 12/14/2023 CLINICAL DATA:  Back trauma, no prior imaging (Age >= 16y) EXAM: CT THORACIC, AND LUMBAR SPINE WITHOUT CONTRAST TECHNIQUE: Multidetector CT imaging of the thoracic and lumbar spine was performed without intravenous contrast. Multiplanar CT image reconstructions were also  generated. RADIATION DOSE REDUCTION: This exam was performed according to the departmental dose-optimization program which includes automated exposure control, adjustment of the mA and/or kV according to patient size and/or use of iterative reconstruction technique. COMPARISON:  None Available. FINDINGS: CT THORACIC SPINE FINDINGS Alignment: Exaggerated thoracic kyphosis. No substantial sagittal subluxation. Dextrocurvature. Vertebrae: No evidence of acute fracture. Vertebral body heights are maintained. Osteopenia. Paraspinal and other soft tissues: Negative. Disc levels: Moderate multilevel degenerative change. CT LUMBAR SPINE FINDINGS Alignment: No substantial sagittal subluxation. Vertebrae: Vertebral body heights are maintained.  Osteopenia. Paraspinal and other soft tissues: Aortic atherosclerosis. Disc levels: Moderate lower lumbar degenerative change including degenerative disc disease is greatest at L5-S1. IMPRESSION: 1. No evidence of acute fracture or traumatic malalignment. 2. Osteopenia. Electronically Signed   By: Gilmore GORMAN Molt M.D.   On: 12/14/2023 22:31   DG Abd Portable 1V Result Date: 12/14/2023 CLINICAL DATA:  [id number] Encounter for feeding tube placement [id number] EXAM: PORTABLE ABDOMEN - 1 VIEW COMPARISON:  None Available. FINDINGS: The bowel gas pattern is non-obstructive. No evidence of pneumoperitoneum. No acute osseous abnormalities. The soft tissues are within normal limits. Surgical changes, devices, tubes and lines: Weighted Dobbhoff tube noted coursing below the left hemidiaphragm with its tip overlying the left paramedian lower abdomen, overlying the region of pylorus of the stomach. IMPRESSION: Weighted Dobbhoff tube noted coursing below the left hemidiaphragm with its tip overlying the left paramedian lower abdomen, overlying the region of pylorus of the stomach. Electronically Signed   By: Ree Molt M.D.   On: 12/14/2023 15:01   CT Head Wo Contrast Result Date:  12/14/2023 EXAM: CT HEAD WITHOUT CONTRAST 12/14/2023 05:17:07 AM TECHNIQUE: CT of the head was performed without the administration of intravenous contrast. Automated exposure control, iterative reconstruction, and/or weight based adjustment of the mA/kV was utilized to reduce the radiation dose to as low as reasonably achievable. COMPARISON: None available. CLINICAL HISTORY: Head trauma, moderate-severe. Chief complaints; Fall; CT Head Wo Contrast; Head trauma, moderate-severe FINDINGS: BRAIN AND VENTRICLES: Bilateral intraventricular hemorrhage is stable, left greater than right. An additional 9 mm focus of hemorrhage along the left side of the septum pellucidum is stable. Moderate atrophy and white matter changes are stable. The ventricles are proportional to the degree of atrophy and stable in size. ORBITS: Peri-orbital hematoma is stable. SINUSES: No acute abnormality. SOFT TISSUES AND SKULL: A left supraorbital scalp laceration is present. No underlying fracture is present. IMPRESSION: 1. Stable bilateral intraventricular hemorrhage, left greater than right, and a stable 9 mm focus of hemorrhage along the left side of the septum pellucidum. 2. Stable moderate atrophy and white matter changes. 3. Left supraorbital scalp laceration and stable peri-orbital hematoma without underlying fracture. Electronically signed by: Lonni Necessary MD 12/14/2023 05:35 AM EDT RP Workstation: [id number]   CT PELVIS WO CONTRAST Result Date: 12/14/2023 CLINICAL DATA:  Hip trauma EXAM: CT PELVIS WITHOUT CONTRAST TECHNIQUE: Multidetector CT imaging of the pelvis was performed following the standard protocol without intravenous contrast. RADIATION DOSE REDUCTION: This exam was performed according to the departmental dose-optimization program which includes automated exposure control, adjustment of the mA and/or kV according to patient size and/or use of iterative reconstruction technique. COMPARISON:  12/13/2023 radiograph, hip  CT 10/11/2022 FINDINGS: Urinary Tract:  No abnormality visualized. Bowel: Diverticular disease of the colon without acute wall thickening. Moderate rectal distension by feces. Vascular/Lymphatic: Atherosclerosis. No aneurysm. No suspicious lymph nodes. Reproductive:  Negative prostate. Other:  Negative for pelvic effusion. Musculoskeletal: Left hip replacement and intramedullary rodding of the right femur with artifact. Old right intertrochanteric fracture. Acute minimally displaced bilateral inferior pubic rami fractures. Acute minimally displaced right superior pubic ramus fracture near the symphysis. The symphysis is not widened. Difficult to exclude subtle nondisplaced fracture at the left puboacetabular junction, coronal series 9, image 76, but limited by artifact. IMPRESSION: 1. Partially visualized intramedullary rod in the right femur. Status post left hip replacement. Acute minimally displaced bilateral inferior pubic rami fractures. Acute minimally displaced right superior pubic ramus fracture near the symphysis. Difficult to exclude subtle nondisplaced fracture at the left puboacetabular junction, artifact from left hip hardware limits further details. 2. Diverticular disease of the colon without acute wall thickening. Moderate rectal distension by feces. 3. Aortic atherosclerosis. Aortic Atherosclerosis (ICD10-I70.0). Electronically Signed   By: Luke Bun M.D.   On: 12/14/2023 00:16   CT Head Wo Contrast Result Date: 12/14/2023 EXAM: CT HEAD AND CERVICAL SPINE 12/13/2023 11:54:16 PM TECHNIQUE: CT of the head and cervical spine was performed without the administration of intravenous contrast. Multiplanar reformatted images are provided for review. Automated exposure control, iterative reconstruction, and/or weight based adjustment of the mA/kV was utilized to reduce the radiation dose to as low as reasonably achievable. COMPARISON: Comparison with CT head 07/22/2023 and CT Cervical spine 07/22/23.  CLINICAL HISTORY: Head trauma, minor (Age >= 65y). Uncontrollable shivering. Straps x2, coban, warm blankets and pt still shivering. Scanned cspine 2x to try to get without motion. FINDINGS: CT HEAD BRAIN AND VENTRICLES: Dependent intraventricular hemorrhage in the left greater than right occipital horns of the lateral ventricles. Additional focus of intraventricular hemorrhage along the posterior septum pellucidum in the atrium of the left lateral ventricle (series 3, image 20). No evidence of intraparenchymal, subdural, or subarachnoid hemorrhage. No mass effect. Basal cisterns are patent. No evidence of acute infarct. Similar ventriculomegaly. ORBITS: Left periorbital hematoma. SINUSES AND MASTOIDS: No acute abnormality. SOFT TISSUES AND SKULL: No calvarial fracture. CT CERVICAL SPINE BONES AND ALIGNMENT: No acute fracture or traumatic malalignment. DEGENERATIVE CHANGES: Age-advanced spondylosis, disc space height loss, and degenerative endplate changes greatest at C5-C6 and C6-C7. Posterior disc osteophyte complex causes mild spinal canal narrowing at C4 to C5. SOFT TISSUES: No prevertebral soft tissue swelling. IMPRESSION: 1. Left greater than right acute intraventricular hemorrhage. 2. Left periorbital hematoma. No calvarial fracture. 3. No acute fracture or traumatic malalignment in the cervical spine. 4. Critical value/emergent results were called by telephone at the time of interpretation on 12/14/23 at 12:12 AM to provider Sandra Essex, who verbally acknowledged these results. Electronically signed by: Norman Gatlin MD 12/14/2023 12:13 AM EDT RP Workstation: [id number]   CT Cervical Spine Wo Contrast Result Date: 12/14/2023 EXAM: CT HEAD AND CERVICAL SPINE 12/13/2023 11:54:16 PM TECHNIQUE: CT of the head and cervical spine was performed without the administration of intravenous contrast. Multiplanar reformatted images are provided for review. Automated exposure control, iterative reconstruction, and/or  weight based adjustment of the mA/kV was utilized to reduce the radiation dose to as low as reasonably achievable. COMPARISON: Comparison with CT head 07/22/2023 and CT Cervical spine 07/22/23. CLINICAL HISTORY: Head trauma, minor (Age >= 65y). Uncontrollable shivering. Straps x2, coban, warm blankets and pt still shivering. Scanned cspine 2x  to try to get without motion. FINDINGS: CT HEAD BRAIN AND VENTRICLES: Dependent intraventricular hemorrhage in the left greater than right occipital horns of the lateral ventricles. Additional focus of intraventricular hemorrhage along the posterior septum pellucidum in the atrium of the left lateral ventricle (series 3, image 20). No evidence of intraparenchymal, subdural, or subarachnoid hemorrhage. No mass effect. Basal cisterns are patent. No evidence of acute infarct. Similar ventriculomegaly. ORBITS: Left periorbital hematoma. SINUSES AND MASTOIDS: No acute abnormality. SOFT TISSUES AND SKULL: No calvarial fracture. CT CERVICAL SPINE BONES AND ALIGNMENT: No acute fracture or traumatic malalignment. DEGENERATIVE CHANGES: Age-advanced spondylosis, disc space height loss, and degenerative endplate changes greatest at C5-C6 and C6-C7. Posterior disc osteophyte complex causes mild spinal canal narrowing at C4 to C5. SOFT TISSUES: No prevertebral soft tissue swelling. IMPRESSION: 1. Left greater than right acute intraventricular hemorrhage. 2. Left periorbital hematoma. No calvarial fracture. 3. No acute fracture or traumatic malalignment in the cervical spine. 4. Critical value/emergent results were called by telephone at the time of interpretation on 12/14/23 at 12:12 AM to provider Sandra Essex, who verbally acknowledged these results. Electronically signed by: Norman Gatlin MD 12/14/2023 12:13 AM EDT RP Workstation: [id number]   DG Chest Portable 1 View Result Date: 12/13/2023 EXAM: 1 VIEW(S) XRAY OF THE CHEST 12/13/2023 09:44:49 PM COMPARISON: Comparison with 07/22/2023  CLINICAL HISTORY: fall FINDINGS: LUNGS AND PLEURA: No focal pulmonary opacity. No pulmonary edema. No pleural effusion. No pneumothorax. HEART AND MEDIASTINUM: No acute abnormality of the cardiac and mediastinal silhouettes. BONES AND SOFT TISSUES: No acute osseous abnormality. IMPRESSION: 1. No acute process. Electronically signed by: Norman Gatlin MD 12/13/2023 10:02 PM EDT RP Workstation: [id number]   DG Pelvis Portable Result Date: 12/13/2023 CLINICAL DATA:  68 year old post fall. EXAM: PORTABLE PELVIS 1-2 VIEWS COMPARISON:  07/22/2023 FINDINGS: The bones are subjectively under mineralized. Findings suggestive of right superior ramus fracture. Potential right inferior ramus fracture. Pubic symphysis and sacroiliac joints are congruent. Left hip hemiarthroplasty, intact were visualized. Right femoral intramedullary nail with trans trochanteric screw fixation. Chronic fragmentation superior to the right greater trochanter. IMPRESSION: 1. Findings suggestive of right superior ramus fracture. Potential right inferior ramus fracture. 2. Postsurgical change of both hips with intact hardware. Electronically Signed   By: Andrea Gasman M.D.   On: 12/13/2023 21:59    Anti-infectives: Anti-infectives (From admission, onward)    None        Assessment/Plan  Unwitnessed fall Dementia and hx of EtOH abuse - palliative consulted for GOC in setting of end stage dementia  I spoke with Tanner Bond's wife, Tanner Bond, she told me that she did not think he would want a feeding tube placed if unable to swallow but will plan to discuss with family as well.  IVH - per NS, repeat CTH was stable, TBI therapies Left periorbital hematoma and laceration - repaired by EDP, absorbable sutures Pubic rami fractures - ortho consulted by EDP, discussed with ortho today WBAT to BLE Dysphagia - palliative consult for GOC as above and will try to clarify whether feeding access would be desired  FEN: Cortrak and TF to goal as  tolerated,  VTE: LMWH 8/9 per NS, SCDs ID: no current abx, afeb  Dispo: 4NP, will need to return to SNF upon DC. Need to clarify whether feeding access needed. Repeat labs today.    LOS: 1 day   I reviewed Consultant NS notes, last 24 h vitals and pain scores, last 48 h intake and output, last 24 h labs and trends,  and last 24 h imaging results.  This care required moderate level of medical decision making.    Burnard JONELLE Louder, [facility] Surgery 12/15/2023, 9:03 AM Please see Amion for pager number during day hours 7:00am-4:30pm

## 2023-12-15 NOTE — Discharge Summary (Signed)
 Physician Discharge Summary  Patient ID: Tanner Bond MRN: [id number] DOB/AGE: Sep 27, 1955 68 y.o.  Admit date: 12/13/2023 Discharge date: 12/16/2023  Discharge Diagnoses Unwitnessed fall Dementia Intraventricular hemorrhage Left periorbital hematoma and laceration Pubic rami fractures  Consultants Neurosurgery Orthopedic surgery  Palliative care  Procedures Laceration repair - Dr. Juliene Bicker (12/13/23)  HPI: Patient is a 68 year old male who presented to the ED as a level 2 trauma s/p unwitnessed fall at SNF where he resides. Patient was unable to provide any history in setting of dementia with baseline orientation to self only. He was found to have above listed injuries and was admitted to the trauma ICU.  Hospital Course: Neurosurgery was consulted and recommended follow up CTH in the morning, which was stable. No other acute intervention recommended from neurosurgical standpoint. Orthopedic surgery consulted for pubic rami fractures and recommended non-operative management with WBAT to BLE. Patient initially was not able to pass bedside swallow assessment by SLP but on HD#2 was more alert and cleared for regular diet. Palliative care was consulted in setting of advanced dementia to help establish GOC. Patient was evaluated by therapies and recommended for return to SNF. On 12/16/23 he was stable for discharge to SNF with follow up as outlined below.   I or a member of my team have reviewed this patient in the Controlled Substance Database   PE: General: WD, cachectic male who is laying in bed in NAD HEENT:  laceration to left temple with sutures present and scab, mild ecchymosis over left temple Heart: regular, rate, and rhythm. Palpable radial and pedal pulses bilaterally Lungs: CTAB, no wheezes, rhonchi, or rales noted.  Respiratory effort nonlabored Abd: soft, NT, ND, +BS, no masses, hernias, or organomegaly MS: all 4 extremities are symmetrical with no cyanosis, clubbing, or  edema. Skin: warm and dry with no masses, lesions, or rashes Neuro: able to follow commands and responding appropriately to questions  Psych: oriented to self only  Allergies as of 12/16/2023   No Known Allergies      Medication List     TAKE these medications    acetaminophen  500 MG tablet Commonly known as: TYLENOL  Take 2 tablets (1,000 mg total) by mouth every 6 (six) hours as needed for mild pain (pain score 1-3) or headache. What changed:  when to take this reasons to take this   busPIRone  10 MG tablet Commonly known as: BUSPAR  Take 10 mg by mouth 3 (three) times daily.   Centrum Silver 50+Men Tabs Take 1 tablet by mouth daily.   donepezil  10 MG tablet Commonly known as: ARICEPT  Take 10 mg by mouth at bedtime.   FISH OIL ADULT GUMMIES PO Take 1 capsule by mouth daily.   levETIRAcetam  500 MG tablet Commonly known as: KEPPRA  Take 1 tablet (500 mg total) by mouth 2 (two) times daily for 6 days.   LORazepam  0.5 MG tablet Commonly known as: ATIVAN  Take 0.5 mg by mouth 2 (two) times daily.   memantine  5 MG tablet Commonly known as: NAMENDA  Take 5 mg by mouth 2 (two) times daily.   methocarbamol  500 MG tablet Commonly known as: ROBAXIN  Place 1 tablet (500 mg total) into feeding tube every 8 (eight) hours as needed for muscle spasms.   NUTRITIONAL SUPPLEMENT PO Take 120 mLs by mouth with breakfast, with lunch, and with evening meal.   QUEtiapine  50 MG tablet Commonly known as: SEROQUEL  Take 50 mg by mouth 2 (two) times daily.   sertraline  100 MG tablet Commonly known  as: ZOLOFT  Take 100 mg by mouth daily.   Vitamin D  (Ergocalciferol ) 1.25 MG (50000 UNIT) Caps capsule Commonly known as: DRISDOL  Take 1 capsule (50,000 Units total) by mouth every 7 (seven) days. What changed: additional instructions          Follow-up Information     [facility].   Specialty: Skilled Nursing Facility Contact information: [address] [place] [address] [phone]         Kendal Franky SQUIBB, MD. Call.   Specialty: Orthopedic Surgery Why: As needed to follow up for pelvic fractures Contact information: [address] [place] [address] [phone]         Darnella Dorn SAUNDERS, MD. Call.   Specialty: Neurosurgery Why: As needed with questions regarding recent traumatic brain injury Contact information: [address], [address] [place] [address] [phone]                 Signed: Burnard SAUNDERS Louder , [facility] Surgery 12/16/2023, 8:54 AM Please see Amion for pager number during day hours 7:00am-4:30pm

## 2023-12-16 LAB — MAGNESIUM: Magnesium: 2.1 mg/dL (ref 1.7–2.4)

## 2023-12-16 LAB — GLUCOSE, CAPILLARY: Glucose-Capillary: 115 mg/dL — ABNORMAL HIGH (ref 70–99)

## 2023-12-16 LAB — PHOSPHORUS: Phosphorus: 3.5 mg/dL (ref 2.5–4.6)

## 2023-12-16 MED ORDER — MORPHINE SULFATE (PF) 2 MG/ML IV SOLN: 2.0000 mg | INTRAVENOUS | Status: DC | PRN

## 2023-12-16 NOTE — Progress Notes (Signed)
 Discharge to SNF via [facility]. TOC coordinating.

## 2023-12-16 NOTE — Progress Notes (Signed)
 OT Cancellation Note  Patient Details Name: Tanner Bond MRN: [id number] DOB: 04/30/1956   Cancelled Treatment:    Reason Eval/Treat Not Completed: Other (comment) (Pt is from SNF where she is total A for ADL. Will defer any OT needs to SNF.)  [facility] 12/16/2023, 6:16 AM Kreg Sink, OT/L   Acute OT Clinical Specialist Acute Rehabilitation Services Pager [phone] Office [phone]

## 2023-12-16 NOTE — [place] FL2 (Signed)
 [place]  MEDICAID FL2 LEVEL OF CARE FORM     IDENTIFICATION  Patient Name: Tanner Bond Birthdate: June 27, 1955 Sex: male Admission Date (Current Location): 12/13/2023  [facility] and [place] Number:  Producer, television/film/video and Address:  [facility], [address] [place], [place] [address]      Provider Number: [id number]  Attending Physician Name and Address:  Md, Trauma, MD  Relative Name and Phone Number:  [facility] (Spouse)  [phone] (Mobile)    Current Level of Care: Hospital Recommended Level of Care: Skilled Nursing Facility Prior Approval Number:    Date Approved/Denied:   PASRR Number: [id number]  Discharge Plan:      Current Diagnoses: Patient Active Problem List   Diagnosis Date Noted   Bleeding in head following injury with loss of consciousness (HCC) 12/14/2023   Closed displaced fracture of neck of left femur (HCC) 05/12/2023   Closed right hip fracture (HCC) 10/11/2022   Intraventricular hemorrhage (HCC) 10/11/2022   Dementia with behavioral disturbance (HCC) 10/11/2022   Hypokalemia 10/11/2022   Tobacco use disorder 10/11/2022   Acne 09/26/2017   BPH associated with nocturia 07/06/2017   Insomnia 07/06/2017   Alcoholic liver disease (HCC) 07/05/2017   Hypertension 04/13/2017   Alcoholism in remission (HCC) 04/12/2017   History of adenomatous polyp of colon 04/12/2017    Orientation RESPIRATION BLADDER Height & Weight     Self  Normal External catheter Weight: 140 lb 3.4 oz (63.6 kg) Height:     BEHAVIORAL SYMPTOMS/MOOD NEUROLOGICAL BOWEL NUTRITION STATUS        Diet (reg)  AMBULATORY STATUS COMMUNICATION OF NEEDS Skin   Extensive Assist Verbally Skin abrasions, Other (Comment) (wound - R upper face; redness)                       Personal Care Assistance Level of Assistance  Bathing, Feeding, Dressing Bathing Assistance: Maximum assistance Feeding assistance: Maximum assistance Dressing Assistance:  Maximum assistance     Functional Limitations Info  Speech     Speech Info: Impaired    SPECIAL CARE FACTORS FREQUENCY                       Contractures      Additional Factors Info  Code Status, Allergies Code Status Info: Do not attempt resuscitation (DNR) PRE-ARREST INTERVENTIONS DESIRED Allergies Info: nka           Current Medications (12/16/2023):  This is the current hospital active medication list Current Facility-Administered Medications  Medication Dose Route Frequency Provider Last Rate Last Admin   acetaminophen  (TYLENOL ) tablet 1,000 mg  1,000 mg Oral Q6H Mahan, Kasie J, NP   1,000 mg at 12/16/23 0515   busPIRone  (BUSPAR ) tablet 10 mg  10 mg Oral TID Mahan, Kasie J, NP   10 mg at 12/16/23 0811   docusate (COLACE) 50 MG/5ML liquid 100 mg  100 mg Oral BID Mahan, Kasie J, NP   100 mg at 12/16/23 9188   donepezil  (ARICEPT ) tablet 10 mg  10 mg Oral QHS Mahan, Kasie J, NP   10 mg at 12/15/23 2212   hydrALAZINE  (APRESOLINE ) injection 10 mg  10 mg Intravenous Q2H PRN Stechschulte, Deward PARAS, MD       levETIRAcetam  (KEPPRA ) tablet 500 mg  500 mg Oral BID Vicci Burnard SAUNDERS, PA-C   500 mg at 12/16/23 9188   LORazepam  (ATIVAN ) injection 1 mg  1 mg Intramuscular Once Smoot, Sarah  A, PA-C       LORazepam  (ATIVAN ) tablet 0.5 mg  0.5 mg Oral BID PRN Mahan, Kasie J, NP       memantine  (NAMENDA ) tablet 5 mg  5 mg Oral BID Mahan, Kasie J, NP   5 mg at 12/16/23 9182   methocarbamol  (ROBAXIN ) tablet 500 mg  500 mg Oral Q8H Stechschulte, Paul J, MD   500 mg at 12/16/23 9188   Or   methocarbamol  (ROBAXIN ) injection 500 mg  500 mg Intravenous Q8H Stechschulte, Paul J, MD   500 mg at 12/16/23 0251   metoprolol  tartrate (LOPRESSOR ) injection 5 mg  5 mg Intravenous Q6H PRN Stechschulte, Paul J, MD       morphine  (PF) 2 MG/ML injection 2 mg  2 mg Intravenous Q4H PRN Johnson, Kelly R, PA-C       ondansetron  (ZOFRAN -ODT) disintegrating tablet 4 mg  4 mg Oral Q6H PRN Stechschulte, Paul J, MD        Or   ondansetron  (ZOFRAN ) injection 4 mg  4 mg Intravenous Q6H PRN Stechschulte, Paul J, MD       Oral care mouth rinse  15 mL Mouth Rinse PRN Stechschulte, Deward PARAS, MD       polyethylene glycol (MIRALAX  / GLYCOLAX ) packet 17 g  17 g Oral Daily PRN Mahan, Kasie J, NP       QUEtiapine  (SEROQUEL ) tablet 25 mg  25 mg Oral QHS Mahan, Kasie J, NP   25 mg at 12/15/23 2211   sertraline  (ZOLOFT ) tablet 100 mg  100 mg Oral Daily Mahan, Kasie J, NP   100 mg at 12/16/23 9188   Tdap (BOOSTRIX ) injection 0.5 mL  0.5 mL Intramuscular Once Smoot, Sarah A, PA-C       thiamine  (VITAMIN B1) tablet 100 mg  100 mg Oral Daily Mahan, Kasie J, NP   100 mg at 12/16/23 9188   traMADol  (ULTRAM ) tablet 25 mg  25 mg Oral Q6H PRN Mahan, Kasie J, NP         Discharge Medications: Please see discharge summary for a list of discharge medications.  Relevant Imaging Results:  Relevant Lab Results:   Additional Information SS #: [id number]  Zyria Fiscus E Galen Malkowski, LCSW

## 2023-12-16 NOTE — Progress Notes (Signed)
 [id number] Liaison Note:  Notified by [facility] manager of patient/family request for [facility]iative services at [facility] after discharge.  Please call with any hospice or outpatient palliative care related questions.  Thank you for the opportunity to participate in this patient's care.  Eleanor Nail, LPN [facility] Liaison [phone]

## 2023-12-16 NOTE — TOC Transition Note (Signed)
 Transition of Care [facility]) - Discharge Note   Patient Details  Name: Tanner Bond MRN: [id number] Date of Birth: 21-May-1955  [facility] CM/SW Contact:  Desta Bujak E Dayvin Aber, LCSW Phone Number: 12/16/2023, 8:50 AM   Clinical Narrative:    PT assessed and has no follow up recs. Patient medically ready to DC back to [facility]. Confirmed with Admissions Worker Darrian that patient can return today - Room 806B. Updated MD, RN, and patient's spouse Francie). She is agreeable to OP Palliative referral.  OP Palliative referral made to [facility] with [facility]. Asked RN to call report. EMS paperwork completed. PTAR arranged for 11am pick up (or later pending truck availably).       Final next level of care: Skilled Nursing Facility Barriers to Discharge: Barriers Resolved   Patient Goals and CMS Choice Patient states their goals for this hospitalization and ongoing recovery are:: return to [facility].gov Compare Post Acute Care list provided to:: Patient Represenative (must comment) Choice offered to / list presented to : Spouse      Discharge Placement              Patient chooses bed at: [facility] Patient to be transferred to facility by: PTAR Name of family member notified: [name] Patient and family notified of of transfer: 12/16/23  Discharge Plan and Services Additional resources added to the After Visit Summary for                                       Social Drivers of Health (SDOH) Interventions SDOH Screenings   Food Insecurity: Patient Unable To Answer (12/15/2023)  Housing: Patient Unable To Answer (12/15/2023)  Transportation Needs: Patient Unable To Answer (12/15/2023)  Utilities: Patient Unable To Answer (12/15/2023)  Social Connections: Patient Unable To Answer (12/15/2023)  Tobacco Use: High Risk (12/13/2023)     Readmission Risk Interventions     No data to display

## 2023-12-16 NOTE — Progress Notes (Signed)
 Report given to Grayce, LPN at [facility]. AVS and discharge papers handed to [facility] for receiving facility. PIV removed. Tele removed and CCMD called. Condom catheter in place per Grayce, LPNs request. Pt's wife Leita called and updated about pt's discharge. Pt transported off unit via stretcher with all personal belongings. PTAR to transport pt to [facility].

## 2024-01-09 DEATH — deceased
